# Patient Record
Sex: Female | Born: 1975 | Race: Black or African American | Hispanic: No | Marital: Married | Smoking: Never smoker
Health system: Southern US, Community
[De-identification: ages and names within clinical notes are randomized; demographics above are authoritative.]

## PROBLEM LIST (undated history)

## (undated) ENCOUNTER — Emergency Department (HOSPITAL_BASED_OUTPATIENT_CLINIC_OR_DEPARTMENT_OTHER): Admission: EM | Payer: Self-pay | Source: Home / Self Care

## (undated) DIAGNOSIS — F32A Depression, unspecified: Secondary | ICD-10-CM

## (undated) DIAGNOSIS — M5030 Other cervical disc degeneration, unspecified cervical region: Secondary | ICD-10-CM

## (undated) DIAGNOSIS — M51369 Other intervertebral disc degeneration, lumbar region without mention of lumbar back pain or lower extremity pain: Secondary | ICD-10-CM

## (undated) DIAGNOSIS — F419 Anxiety disorder, unspecified: Secondary | ICD-10-CM

## (undated) DIAGNOSIS — G35 Multiple sclerosis: Secondary | ICD-10-CM

## (undated) DIAGNOSIS — F329 Major depressive disorder, single episode, unspecified: Secondary | ICD-10-CM

## (undated) DIAGNOSIS — N189 Chronic kidney disease, unspecified: Secondary | ICD-10-CM

## (undated) DIAGNOSIS — M5136 Other intervertebral disc degeneration, lumbar region: Secondary | ICD-10-CM

## (undated) DIAGNOSIS — R42 Dizziness and giddiness: Secondary | ICD-10-CM

## (undated) DIAGNOSIS — G709 Myoneural disorder, unspecified: Secondary | ICD-10-CM

## (undated) DIAGNOSIS — D649 Anemia, unspecified: Secondary | ICD-10-CM

## (undated) DIAGNOSIS — G43909 Migraine, unspecified, not intractable, without status migrainosus: Secondary | ICD-10-CM

## (undated) DIAGNOSIS — G8929 Other chronic pain: Secondary | ICD-10-CM

## (undated) HISTORY — PX: HERNIA REPAIR: SHX51

## (undated) HISTORY — DX: Multiple sclerosis: G35

## (undated) HISTORY — PX: OTHER SURGICAL HISTORY: SHX169

## (undated) HISTORY — DX: Anemia, unspecified: D64.9

## (undated) HISTORY — DX: Major depressive disorder, single episode, unspecified: F32.9

## (undated) HISTORY — DX: Myoneural disorder, unspecified: G70.9

## (undated) HISTORY — DX: Depression, unspecified: F32.A

## (undated) HISTORY — DX: Other chronic pain: G89.29

## (undated) HISTORY — DX: Chronic kidney disease, unspecified: N18.9

---

## 2005-04-03 ENCOUNTER — Ambulatory Visit: Payer: Self-pay | Admitting: Family Medicine

## 2013-09-11 ENCOUNTER — Emergency Department (HOSPITAL_BASED_OUTPATIENT_CLINIC_OR_DEPARTMENT_OTHER)
Admission: EM | Admit: 2013-09-11 | Discharge: 2013-09-11 | Disposition: A | Discharge status: Home / Self Care | Payer: BC Managed Care – PPO | Attending: Emergency Medicine | Admitting: Emergency Medicine

## 2013-09-11 ENCOUNTER — Emergency Department (HOSPITAL_BASED_OUTPATIENT_CLINIC_OR_DEPARTMENT_OTHER): Payer: BC Managed Care – PPO

## 2013-09-11 ENCOUNTER — Encounter (HOSPITAL_BASED_OUTPATIENT_CLINIC_OR_DEPARTMENT_OTHER): Payer: Self-pay | Admitting: Emergency Medicine

## 2013-09-11 DIAGNOSIS — Z79899 Other long term (current) drug therapy: Secondary | ICD-10-CM | POA: Insufficient documentation

## 2013-09-11 DIAGNOSIS — G5601 Carpal tunnel syndrome, right upper limb: Secondary | ICD-10-CM

## 2013-09-11 DIAGNOSIS — G5600 Carpal tunnel syndrome, unspecified upper limb: Secondary | ICD-10-CM | POA: Insufficient documentation

## 2013-09-11 DIAGNOSIS — G43909 Migraine, unspecified, not intractable, without status migrainosus: Secondary | ICD-10-CM | POA: Insufficient documentation

## 2013-09-11 HISTORY — DX: Migraine, unspecified, not intractable, without status migrainosus: G43.909

## 2013-09-11 NOTE — ED Notes (Signed)
Patient states that last night her fingers felt numb and today her right hand feels numb and is swollen. Good color pulse, warm to touch.

## 2013-09-11 NOTE — Discharge Instructions (Signed)
Carpal Tunnel Syndrome The carpal tunnel is an area under the skin of the palm of your hand. Nerves, blood vessels, and strong tissues (tendons) pass through the tunnel. The tunnel can become puffy (swollen). If this happens, a nerve can be pinched in the wrist. This causes carpal tunnel syndrome.  HOME CARE  Take all medicine as told by your doctor.  If you were given a splint, wear it as told. Wear it at night or at times when your doctor told you to.  Rest your wrist from the activity that causes your pain.  Put ice on your wrist after long periods of wrist activity.  Put ice in a plastic bag.  Place a towel between your skin and the bag.  Leave the ice on for 15-20 minutes, 03-04 times a day.  Keep all doctor visits as told. GET HELP RIGHT AWAY IF:  You have new problems you cannot explain.  Your problems get worse and medicine does not help. MAKE SURE YOU:   Understand these instructions.  Will watch your condition.  Will get help right away if you are not doing well or get worse. Document Released: 06/26/2011 Document Revised: 09/29/2011 Document Reviewed: 06/26/2011 [employer] Patient Information 2014 [employer].  

## 2013-09-11 NOTE — ED Provider Notes (Signed)
CSN: [id number]     Arrival date & time 09/11/13  1714 History  This chart was scribed for Shaune Pollack, MD by Vernell Barrier, ED scribe. This patient was seen in room MH04/MH04 and the patient's care was started at 6:49 PM.   Chief Complaint  Patient presents with  . Hand Pain   Patient is a 38 y.o. female presenting with hand pain. The history is provided by the patient. No language interpreter was used.  Hand Pain This is a new problem. The current episode started yesterday. The problem occurs constantly. The problem has been gradually worsening. The symptoms are aggravated by twisting and bending. Nothing relieves the symptoms. She has tried nothing for the symptoms.   HPI Comments: Andrea Briggs is a 38 y.o. female who presents to the Emergency Department complaining of bilateral hand pain with associated finger tingling, onset 1 day ago. States she noticed her fingers began feeling numb yesterday in all but the  thumbs. Today she states the right hand feels numb and swollen. Pt wraps packages for a living. Pt is right handed.   Past Medical History  Diagnosis Date  . Migraine    Past Surgical History  Procedure Laterality Date  . Hernia repair     No family history on file. History  Substance Use Topics  . Smoking status: Never Smoker   . Smokeless tobacco: Not on file  . Alcohol Use: No   OB History   Grav Para Term Preterm Abortions TAB SAB Ect Mult Living                 Review of Systems  Neurological: Positive for numbness.   Allergies  Review of patient's allergies indicates no known allergies.  Home Medications   Current Outpatient Rx  Name  Route  Sig  Dispense  Refill  . ibuprofen (ADVIL,MOTRIN) 800 MG tablet   Oral   Take 800 mg by mouth every 8 (eight) hours as needed.         . nortriptyline (PAMELOR) 10 MG capsule   Oral   Take 10 mg by mouth at bedtime.         . topiramate (TOPAMAX) 15 MG capsule   Oral   Take 5 mg by mouth 2 (two)  times daily.          Triage Vitals: BP 113/79  Pulse 108  Temp(Src) 97.9 F (36.6 C) (Oral)  Resp 22  Ht 5\' 6"  (1.676 m)  Wt 200 lb (90.719 kg)  BMI 32.30 kg/m2  SpO2 98% Physical Exam  Nursing note and vitals reviewed. Constitutional: She is oriented to person, place, and time. She appears well-developed and well-nourished.  HENT:  Head: Normocephalic and atraumatic.  Right Ear: External ear normal.  Left Ear: External ear normal.  Nose: Nose normal.  Mouth/Throat: Oropharynx is clear and moist.  Eyes: Conjunctivae and EOM are normal. Pupils are equal, round, and reactive to light.  Neck: Normal range of motion. Neck supple. No JVD present. No tracheal deviation present. No thyromegaly present.  Cardiovascular: Normal rate, regular rhythm, normal heart sounds and intact distal pulses.   Pulmonary/Chest: Effort normal and breath sounds normal. No respiratory distress. She has no wheezes.  Abdominal: Soft. Bowel sounds are normal. She exhibits no mass. There is no tenderness. There is no guarding.  Musculoskeletal: Normal range of motion. She exhibits no tenderness.  +phalen's test right wrist +tinel's test right wrist Finger's pink, cap refill less than two seconds Full  arom fingers, wrist, elbow    Lymphadenopathy:    She has no cervical adenopathy.  Neurological: She is alert and oriented to person, place, and time. She has normal reflexes. No cranial nerve deficit or sensory deficit. Gait normal. GCS eye subscore is 4. GCS verbal subscore is 5. GCS motor subscore is 6.  Reflex Scores:      Bicep reflexes are 2+ on the right side and 2+ on the left side.      Patellar reflexes are 2+ on the right side and 2+ on the left side. HANDS: Sensation intact  Skin: Skin is warm and dry.  Psychiatric: She has a normal mood and affect. Her behavior is normal. Judgment and thought content normal.    ED Course  Procedures (including critical care time) DIAGNOSTIC  STUDIES: Oxygen Saturation is 98% on room air, normal by my interpretation.    COORDINATION OF CARE: At 6:52 PM: Discussed treatment plan with patient which includes putting the patient in a hand splint. Patient agrees.   Imaging Review Dg Hand Complete Right  09/11/2013   CLINICAL DATA:  Right hand swelling at base of ring finger and little finger, no known injury  EXAM: RIGHT HAND - COMPLETE 3+ VIEW  COMPARISON:  None  FINDINGS: Osseous mineralization normal.  Joint spaces preserved.  No fracture, dislocation, or bone destruction.  IMPRESSION: No acute osseous abnormalities.   Electronically Signed   By: Lavonia Dana M.D.   On: 09/11/2013 18:08   MDM   Final diagnoses:  Carpal tunnel syndrome, right    I personally performed the services described in this documentation, which was scribed in my presence. The recorded information has been reviewed and considered.     Shaune Pollack, MD 09/11/13 2021

## 2013-09-13 ENCOUNTER — Ambulatory Visit: Payer: BC Managed Care – PPO | Admitting: Family Medicine

## 2013-09-13 ENCOUNTER — Ambulatory Visit: Payer: Self-pay | Admitting: Family Medicine

## 2013-09-14 ENCOUNTER — Ambulatory Visit (INDEPENDENT_AMBULATORY_CARE_PROVIDER_SITE_OTHER): Payer: BC Managed Care – PPO | Admitting: Family Medicine

## 2013-09-14 ENCOUNTER — Encounter: Payer: Self-pay | Admitting: Family Medicine

## 2013-09-14 VITALS — BP 110/77 | HR 101 | Ht 66.0 in | Wt 200.0 lb

## 2013-09-14 DIAGNOSIS — M25539 Pain in unspecified wrist: Secondary | ICD-10-CM

## 2013-09-14 DIAGNOSIS — R202 Paresthesia of skin: Secondary | ICD-10-CM

## 2013-09-14 DIAGNOSIS — R209 Unspecified disturbances of skin sensation: Secondary | ICD-10-CM

## 2013-09-14 DIAGNOSIS — M25531 Pain in right wrist: Secondary | ICD-10-CM

## 2013-09-14 DIAGNOSIS — R200 Anesthesia of skin: Secondary | ICD-10-CM

## 2013-09-14 MED ORDER — PREDNISONE (PAK) 10 MG PO TABS: ORAL_TABLET | ORAL | Status: DC

## 2013-09-14 NOTE — Patient Instructions (Signed)
Use wrist brace as often as possible especially at nighttime when sleeping for pain, inflammation, swelling. Take prednisone dose pack as directed for 6 days. Out of work for 1 week - follow up at that time to reassess. If not improving can consider injection into carpal tunnel, nerve conduction studies.

## 2013-09-16 ENCOUNTER — Encounter: Payer: Self-pay | Admitting: Family Medicine

## 2013-09-16 DIAGNOSIS — G5602 Carpal tunnel syndrome, left upper limb: Secondary | ICD-10-CM | POA: Insufficient documentation

## 2013-09-16 NOTE — Assessment & Plan Note (Signed)
distribution not classic for carpal tunnel though history otherwise would be most consistent with this.  Will start with wrist braces.  Discussed injection - she would like to try oral prednisone instead.  Consider trial of injection, NCVs if not improving.  F/u in 1 week - unable to wear brace at work so out of work until f/u.

## 2013-09-16 NOTE — Progress Notes (Signed)
Patient ID: Andrea Briggs, female   DOB: 06-Sep-1975, 38 y.o.   MRN: [id number]  PCP: No primary provider on file.  Subjective:   HPI: Patient is a 38 y.o. female here for right hand numbness.  Patient reports she's had issues with carpal tunnel in the past. Reports on Sunday her fingers (all but the thumb) started going numb. Had some of this on left side but it went away. Hand felt swollen also. 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 Tried ibuprofen. Has a wrist splint but not allowed to wear at work. Not tried any other medicines for this. Previously did prednisone dose packs for carpal tunnel.  Past Medical History  Diagnosis Date  . Migraine     Current Outpatient Prescriptions on File Prior to Visit  Medication Sig Dispense Refill  . ibuprofen (ADVIL,MOTRIN) 800 MG tablet Take 800 mg by mouth every 8 (eight) hours as needed.      . nortriptyline (PAMELOR) 10 MG capsule Take 10 mg by mouth at bedtime.      . topiramate (TOPAMAX) 15 MG capsule Take 5 mg by mouth 2 (two) times daily.       No current facility-administered medications on file prior to visit.    Past Surgical History  Procedure Laterality Date  . Hernia repair      No Known Allergies  History   Social History  . Marital Status: Single    Spouse Name: N/A    Number of Children: N/A  . Years of Education: N/A   Occupational History  . Not on file.   Social History Main Topics  . Smoking status: Never Smoker   . Smokeless tobacco: Not on file  . Alcohol Use: No  . Drug Use: Not on file  . Sexual Activity: Not on file   Other Topics Concern  . Not on file   Social History Narrative  . No narrative on file    History reviewed. No pertinent family history.  BP 110/77  Pulse 101  Ht 5\' 6"  (1.676 m)  Wt 200 lb (90.719 kg)  BMI 32.30 kg/m2  Review of Systems: See HPI above.    Objective:  Physical Exam:  Gen: NAD  Right hand/wrist: No gross deformity, swelling,  bruising. TTP volar wrist.  No other tenderness. FROM wrist and digits with 5/5 strength. + tinels and phalens. Sensation diminished 2nd-5th digits.  Assessment & Plan:  1. Right hand numbness - distribution not classic for carpal tunnel though history otherwise would be most consistent with this.  Will start with wrist braces.  Discussed injection - she would like to try oral prednisone instead.  Consider trial of injection, NCVs if not improving.  F/u in 1 week - unable to wear brace at work so out of work until f/u.

## 2013-09-21 ENCOUNTER — Encounter: Payer: Self-pay | Admitting: Family Medicine

## 2013-09-21 ENCOUNTER — Ambulatory Visit (INDEPENDENT_AMBULATORY_CARE_PROVIDER_SITE_OTHER): Payer: BC Managed Care – PPO | Admitting: Family Medicine

## 2013-09-21 VITALS — BP 107/74 | HR 72 | Ht 66.0 in | Wt 200.0 lb

## 2013-09-21 DIAGNOSIS — R209 Unspecified disturbances of skin sensation: Secondary | ICD-10-CM

## 2013-09-21 DIAGNOSIS — R202 Paresthesia of skin: Principal | ICD-10-CM

## 2013-09-21 DIAGNOSIS — R200 Anesthesia of skin: Secondary | ICD-10-CM

## 2013-09-21 NOTE — Patient Instructions (Signed)
Use wrist brace as often as possible especially at nighttime when sleeping for pain, inflammation, swelling. Cortisone injection given today. Out of work for 2 more weeks then follow up at that time to reassess. If still not improving next step would be nerve conduction studies.

## 2013-09-23 ENCOUNTER — Encounter: Payer: Self-pay | Admitting: Family Medicine

## 2013-09-23 NOTE — Progress Notes (Signed)
Patient ID: Buckner Malta, female   DOB: 1976-04-01, 38 y.o.   MRN: [id number]  PCP: No primary provider on file.  Subjective:   HPI: Patient is a 38 y.o. female here for right hand numbness.  2/25: Patient reports she's had issues with carpal tunnel in the past. Reports on Sunday her fingers (all but the thumb) started going numb. Had some of this on left side but it went away. Hand felt swollen also. 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 Tried ibuprofen. Has a wrist splint but not allowed to wear at work. Not tried any other medicines for this. Previously did prednisone dose packs for carpal tunnel.  3/4: Patient reports she is no better following prednisone and using wrist braces. Slight swelling. Icing and taking ibuprofen now. Keeping her up at night.  Past Medical History  Diagnosis Date  . Migraine     Current Outpatient Prescriptions on File Prior to Visit  Medication Sig Dispense Refill  . hydrOXYzine (VISTARIL) 25 MG capsule       . ibuprofen (ADVIL,MOTRIN) 800 MG tablet Take 800 mg by mouth every 8 (eight) hours as needed.      . nortriptyline (PAMELOR) 10 MG capsule Take 10 mg by mouth at bedtime.      . predniSONE (STERAPRED UNI-PAK) 10 MG tablet 6 tabs po day 1, 5 tabs po day 2, 4 tabs po day 3, 3 tabs po day 4, 2 tabs po day 5, 1 tab po day 6  21 tablet  0  . topiramate (TOPAMAX) 15 MG capsule Take 5 mg by mouth 2 (two) times daily.       No current facility-administered medications on file prior to visit.    Past Surgical History  Procedure Laterality Date  . Hernia repair      No Known Allergies  History   Social History  . Marital Status: Single    Spouse Name: N/A    Number of Children: N/A  . Years of Education: N/A   Occupational History  . Not on file.   Social History Main Topics  . Smoking status: Never Smoker   . Smokeless tobacco: Not on file  . Alcohol Use: No  . Drug Use: Not on file  . Sexual Activity: Not on file    Other Topics Concern  . Not on file   Social History Narrative  . No narrative on file    History reviewed. No pertinent family history.  BP 107/74  Pulse 72  Ht 5\' 6"  (1.676 m)  Wt 200 lb (90.719 kg)  BMI 32.30 kg/m2  Review of Systems: See HPI above.    Objective:  Physical Exam:  Gen: NAD  Right hand/wrist: No gross deformity, swelling, bruising. TTP volar wrist.  No other tenderness. FROM wrist and digits with 5/5 strength. + tinels and phalens. Sensation diminished all digits, mainly first 4 now.  Assessment & Plan:  1. Right hand numbness - likely due to carpal tunnel syndrome.  Continue wrist braces.  Injection given today.  Out of work 2 more weeks then reassess.  NCVs/EMGs if not improving.  After informed written consent patient was seated on exam table.  Ultrasound used to identify between right median nerve and ulnar artery.  Alcohol swab for prep then injected in this location of right wrist with 2:1 marcaine: depomedrol.  Patient tolerated procedure well without immediate complications.

## 2013-09-23 NOTE — Assessment & Plan Note (Signed)
likely due to carpal tunnel syndrome.  Continue wrist braces.  Injection given today.  Out of work 2 more weeks then reassess.  NCVs/EMGs if not improving.  After informed written consent patient was seated on exam table.  Ultrasound used to identify between right median nerve and ulnar artery.  Alcohol swab for prep then injected in this location of right wrist with 2:1 marcaine: depomedrol.  Patient tolerated procedure well without immediate complications.

## 2013-10-06 ENCOUNTER — Ambulatory Visit (INDEPENDENT_AMBULATORY_CARE_PROVIDER_SITE_OTHER): Payer: BC Managed Care – PPO | Admitting: Family Medicine

## 2013-10-06 ENCOUNTER — Encounter: Payer: Self-pay | Admitting: Family Medicine

## 2013-10-06 VITALS — BP 112/80 | HR 89 | Ht 66.0 in | Wt 200.0 lb

## 2013-10-06 DIAGNOSIS — R200 Anesthesia of skin: Secondary | ICD-10-CM

## 2013-10-06 DIAGNOSIS — R202 Paresthesia of skin: Principal | ICD-10-CM

## 2013-10-06 DIAGNOSIS — R209 Unspecified disturbances of skin sensation: Secondary | ICD-10-CM

## 2013-10-06 NOTE — Patient Instructions (Signed)
Follow up with me as needed - call with any problems. See work note for details.

## 2013-10-10 ENCOUNTER — Encounter: Payer: Self-pay | Admitting: Family Medicine

## 2013-10-10 NOTE — Progress Notes (Signed)
Patient ID: Andrea Briggs, female   DOB: 16-Sep-1975, 38 y.o.   MRN: [id number]  PCP: No primary provider on file.  Subjective:   HPI: Patient is a 38 y.o. female here for right hand numbness.  2/25: Patient reports she's had issues with carpal tunnel in the past. Reports on Sunday her fingers (all but the thumb) started going numb. Had some of this on left side but it went away. Hand felt swollen also. 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 Tried ibuprofen. Has a wrist splint but not allowed to wear at work. Not tried any other medicines for this. Previously did prednisone dose packs for carpal tunnel.  3/4: Patient reports she is no better following prednisone and using wrist braces. Slight swelling. Icing and taking ibuprofen now. Keeping her up at night.  3/19: Patient doing much better following injection. A little pain Monday and Tuesday but none now. No swelling. Taking ibuprofen and pain medicine occasionally.  Past Medical History  Diagnosis Date  . Migraine     Current Outpatient Prescriptions on File Prior to Visit  Medication Sig Dispense Refill  . hydrOXYzine (VISTARIL) 25 MG capsule       . ibuprofen (ADVIL,MOTRIN) 800 MG tablet Take 800 mg by mouth every 8 (eight) hours as needed.      . nortriptyline (PAMELOR) 10 MG capsule Take 10 mg by mouth at bedtime.      . topiramate (TOPAMAX) 15 MG capsule Take 5 mg by mouth 2 (two) times daily.       No current facility-administered medications on file prior to visit.    Past Surgical History  Procedure Laterality Date  . Hernia repair      No Known Allergies  History   Social History  . Marital Status: Single    Spouse Name: N/A    Number of Children: N/A  . Years of Education: N/A   Occupational History  . Not on file.   Social History Main Topics  . Smoking status: Never Smoker   . Smokeless tobacco: Not on file  . Alcohol Use: No  . Drug Use: Not on file  . Sexual Activity: Not  on file   Other Topics Concern  . Not on file   Social History Narrative  . No narrative on file    History reviewed. No pertinent family history.  BP 112/80  Pulse 89  Ht 5\' 6"  (1.676 m)  Wt 200 lb (90.719 kg)  BMI 32.30 kg/m2  Review of Systems: See HPI above.    Objective:  Physical Exam:  Gen: NAD  Right hand/wrist: No gross deformity, swelling, bruising. No TTP volar wrist.  No other tenderness. FROM wrist and digits with 5/5 strength. Negative tinels and phalens.  Assessment & Plan:  1. Right hand numbness - 2/2 carpal tunnel syndrome.  Continue wrist braces.  Much better following injection.  Will return to work (see note for date).  F/u prn.  Consider NCVs/EMGs if worsens.

## 2013-10-10 NOTE — Assessment & Plan Note (Signed)
2/2 carpal tunnel syndrome.  Continue wrist braces.  Much better following injection.  Will return to work (see note for date).  F/u prn.  Consider NCVs/EMGs if worsens.

## 2013-11-24 ENCOUNTER — Ambulatory Visit: Payer: BC Managed Care – PPO | Admitting: Family Medicine

## 2013-11-28 ENCOUNTER — Encounter: Payer: Self-pay | Admitting: Family Medicine

## 2013-11-28 ENCOUNTER — Ambulatory Visit (INDEPENDENT_AMBULATORY_CARE_PROVIDER_SITE_OTHER): Payer: BC Managed Care – PPO | Admitting: Family Medicine

## 2013-11-28 VITALS — BP 104/72 | HR 80 | Ht 66.0 in | Wt 200.0 lb

## 2013-11-28 DIAGNOSIS — G5600 Carpal tunnel syndrome, unspecified upper limb: Secondary | ICD-10-CM

## 2013-11-28 DIAGNOSIS — G5603 Carpal tunnel syndrome, bilateral upper limbs: Secondary | ICD-10-CM

## 2013-11-28 NOTE — Patient Instructions (Signed)
Use wrist brace as often as possible especially at nighttime when sleeping for pain, inflammation, swelling. Consider repeating cortisone injection or nerve conduction studies as next steps. Follow up with me as needed.

## 2013-11-30 ENCOUNTER — Encounter: Payer: Self-pay | Admitting: Family Medicine

## 2013-11-30 NOTE — Assessment & Plan Note (Addendum)
Symptoms have recurred right side, some intermittent issues with left side.  Continue wrist braces.  Discussed considering repeating injection, NCVs/EMGs.  She wants to wait on either of these for now.  F/u prn.

## 2013-11-30 NOTE — Progress Notes (Signed)
Patient ID: Andrea Briggs, female   DOB: 1976/06/17, 38 y.o.   MRN: [id number]  PCP: No primary provider on file.  Subjective:   HPI: Patient is a 38 y.o. female here for right hand numbness.  2/25: Patient reports she's had issues with carpal tunnel in the past. Reports on Sunday her fingers (all but the thumb) started going numb. Had some of this on left side but it went away. Hand felt swollen also. 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 Tried ibuprofen. Has a wrist splint but not allowed to wear at work. Not tried any other medicines for this. Previously did prednisone dose packs for carpal tunnel.  3/4: Patient reports she is no better following prednisone and using wrist braces. Slight swelling. Icing and taking ibuprofen now. Keeping her up at night.  3/19: Patient doing much better following injection. A little pain Monday and Tuesday but none now. No swelling. Taking ibuprofen and pain medicine occasionally.  5/11: Patient reports over past 2 weeks she's had symptoms in right hand return. Started intermittently and becoming more constant. Numbness, tightness into fingers and pain as well especially into right middle and ring fingers. Difficulty grasping items. No swelling. Getting some left hand numbness intermittently. Able to wear brace on right wrist at work now.  Past Medical History  Diagnosis Date  . Migraine     Current Outpatient Prescriptions on File Prior to Visit  Medication Sig Dispense Refill  . hydrOXYzine (VISTARIL) 25 MG capsule       . ibuprofen (ADVIL,MOTRIN) 800 MG tablet Take 800 mg by mouth every 8 (eight) hours as needed.      . nortriptyline (PAMELOR) 10 MG capsule Take 10 mg by mouth at bedtime.      . topiramate (TOPAMAX) 15 MG capsule Take 5 mg by mouth 2 (two) times daily.       No current facility-administered medications on file prior to visit.    Past Surgical History  Procedure Laterality Date  . Hernia repair       No Known Allergies  History   Social History  . Marital Status: Single    Spouse Name: N/A    Number of Children: N/A  . Years of Education: N/A   Occupational History  . Not on file.   Social History Main Topics  . Smoking status: Never Smoker   . Smokeless tobacco: Not on file  . Alcohol Use: No  . Drug Use: Not on file  . Sexual Activity: Not on file   Other Topics Concern  . Not on file   Social History Narrative  . No narrative on file    History reviewed. No pertinent family history.  BP 104/72  Pulse 80  Ht 5\' 6"  (1.676 m)  Wt 200 lb (90.719 kg)  BMI 32.30 kg/m2  Review of Systems: See HPI above.    Objective:  Physical Exam:  Gen: NAD  Right hand/wrist: No gross deformity, swelling, bruising. TTP volar wrist over carpal tunnel.  No other tenderness. FROM wrist and digits with 5/5 strength. Positive tinels, negative phalens. Sensation diminished in digits especially 3rd, 4th digits.  Left hand/wrist: No gross deformity, swelling, bruising. Minimal TTP volar wrist over carpal tunnel.  No other tenderness. FROM wrist and digits with 5/5 strength. Negative tinels, negative phalens. Sensation intact currently.  Assessment & Plan:  1. Bilateral hand numbness - 2/2 carpal tunnel syndrome.  Symptoms have recurred right side, some intermittent issues with left  side.  Continue wrist braces.  Discussed considering repeating injection, NCVs/EMGs.  She wants to wait on either of these for now.  F/u prn.

## 2013-12-21 ENCOUNTER — Emergency Department (HOSPITAL_BASED_OUTPATIENT_CLINIC_OR_DEPARTMENT_OTHER)
Admission: EM | Admit: 2013-12-21 | Discharge: 2013-12-22 | Disposition: A | Discharge status: Home / Self Care | Payer: BC Managed Care – PPO | Attending: Emergency Medicine | Admitting: Emergency Medicine

## 2013-12-21 ENCOUNTER — Emergency Department (HOSPITAL_BASED_OUTPATIENT_CLINIC_OR_DEPARTMENT_OTHER): Payer: BC Managed Care – PPO

## 2013-12-21 ENCOUNTER — Encounter (HOSPITAL_BASED_OUTPATIENT_CLINIC_OR_DEPARTMENT_OTHER): Payer: Self-pay | Admitting: Emergency Medicine

## 2013-12-21 DIAGNOSIS — R1031 Right lower quadrant pain: Secondary | ICD-10-CM | POA: Insufficient documentation

## 2013-12-21 DIAGNOSIS — R110 Nausea: Secondary | ICD-10-CM | POA: Insufficient documentation

## 2013-12-21 DIAGNOSIS — R109 Unspecified abdominal pain: Secondary | ICD-10-CM

## 2013-12-21 DIAGNOSIS — R319 Hematuria, unspecified: Secondary | ICD-10-CM

## 2013-12-21 DIAGNOSIS — M549 Dorsalgia, unspecified: Secondary | ICD-10-CM | POA: Insufficient documentation

## 2013-12-21 DIAGNOSIS — Z3202 Encounter for pregnancy test, result negative: Secondary | ICD-10-CM | POA: Insufficient documentation

## 2013-12-21 DIAGNOSIS — G43909 Migraine, unspecified, not intractable, without status migrainosus: Secondary | ICD-10-CM | POA: Insufficient documentation

## 2013-12-21 DIAGNOSIS — Z79899 Other long term (current) drug therapy: Secondary | ICD-10-CM | POA: Insufficient documentation

## 2013-12-21 LAB — BASIC METABOLIC PANEL
BUN: 13 mg/dL (ref 6–23)
CHLORIDE: 107 meq/L (ref 96–112)
CO2: 23 mEq/L (ref 19–32)
Calcium: 9 mg/dL (ref 8.4–10.5)
Creatinine, Ser: 1.1 mg/dL (ref 0.50–1.10)
GFR, EST AFRICAN AMERICAN: 73 mL/min — AB (ref >90)
GFR, EST NON AFRICAN AMERICAN: 63 mL/min — AB (ref >90)
Glucose, Bld: 97 mg/dL (ref 70–99)
POTASSIUM: 3.7 meq/L (ref 3.7–5.3)
Sodium: 141 mEq/L (ref 137–147)

## 2013-12-21 LAB — URINE MICROSCOPIC-ADD ON

## 2013-12-21 LAB — URINALYSIS, ROUTINE W REFLEX MICROSCOPIC
Bilirubin Urine: NEGATIVE
GLUCOSE, UA: NEGATIVE mg/dL
Ketones, ur: NEGATIVE mg/dL
LEUKOCYTES UA: NEGATIVE
Nitrite: NEGATIVE
PH: 5.5 (ref 5.0–8.0)
PROTEIN: NEGATIVE mg/dL
Specific Gravity, Urine: 1.034 — ABNORMAL HIGH (ref 1.005–1.030)
Urobilinogen, UA: 0.2 mg/dL (ref 0.0–1.0)

## 2013-12-21 LAB — PREGNANCY, URINE: Preg Test, Ur: NEGATIVE

## 2013-12-21 MED ORDER — OXYCODONE-ACETAMINOPHEN 5-325 MG PO TABS: 2.0000 | ORAL_TABLET | ORAL | Status: DC | PRN

## 2013-12-21 MED ORDER — KETOROLAC TROMETHAMINE 60 MG/2ML IM SOLN
60.0000 mg | Freq: Once | INTRAMUSCULAR | Status: AC
  Administered 2013-12-21: 60 mg via INTRAMUSCULAR
  Filled 2013-12-21: qty 2

## 2013-12-21 NOTE — ED Notes (Signed)
MD at bedside. 

## 2013-12-21 NOTE — Discharge Instructions (Signed)
Abdominal Pain, Adult Many things can cause abdominal pain. Usually, abdominal pain is not caused by a disease and will improve without treatment. It can often be observed and treated at home. Your health care provider will do a physical exam and possibly order blood tests and X-rays to help determine the seriousness of your pain. However, in many cases, more time must pass before a clear cause of the pain can be found. Before that point, your health care provider may not know if you need more testing or further treatment. HOME CARE INSTRUCTIONS  Monitor your abdominal pain for any changes. The following actions may help to alleviate any discomfort you are experiencing:  Only take over-the-counter or prescription medicines as directed by your health care provider.  Do not take laxatives unless directed to do so by your health care provider.  Try a clear liquid diet (broth, tea, or water) as directed by your health care provider. Slowly move to a bland diet as tolerated. SEEK MEDICAL CARE IF:  You have unexplained abdominal pain.  You have abdominal pain associated with nausea or diarrhea.  You have pain when you urinate or have a bowel movement.  You experience abdominal pain that wakes you in the night.  You have abdominal pain that is worsened or improved by eating food.  You have abdominal pain that is worsened with eating fatty foods. SEEK IMMEDIATE MEDICAL CARE IF:   Your pain does not go away within 2 hours.  You have a fever.  You keep throwing up (vomiting).  Your pain is felt only in portions of the abdomen, such as the right side or the left lower portion of the abdomen.  You pass bloody or black tarry stools. MAKE SURE YOU:  Understand these instructions.   Will watch your condition.   Will get help right away if you are not doing well or get worse.  Document Released: 04/16/2005 Document Revised: 04/27/2013 Document Reviewed: 03/16/2013 [facility] Patient  Information 2014 [place].  Hematuria, Adult Hematuria is blood in your urine. It can be caused by a bladder infection, kidney infection, prostate infection, kidney stone, or cancer of your urinary tract. Infections can usually be treated with medicine, and a kidney stone usually will pass through your urine. If neither of these is the cause of your hematuria, further workup to find out the reason may be needed. It is very important that you tell your health care provider about any blood you see in your urine, even if the blood stops without treatment or happens without causing pain. Blood in your urine that happens and then stops and then happens again can be a symptom of a very serious condition. Also, pain is not a symptom in the initial stages of many urinary cancers. HOME CARE INSTRUCTIONS   Drink lots of fluid, 3 4 quarts a day. If you have been diagnosed with an infection, cranberry juice is especially recommended, in addition to large amounts of water.  Avoid caffeine, tea, and carbonated beverages, because they tend to irritate the bladder.  Avoid alcohol because it may irritate the prostate.  Only take over-the-counter or prescription medicines for pain, discomfort, or fever as directed by your health care provider.  If you have been diagnosed with a kidney stone, follow your health care provider's instructions regarding straining your urine to catch the stone.  Empty your bladder often. Avoid holding urine for long periods of time.  After a bowel movement, women should cleanse front to back. Use  each tissue only once.  Empty your bladder before and after sexual intercourse if you are a female. SEEK MEDICAL CARE IF: You develop back pain, fever, a feeling of sickness in your stomach (nausea), or vomiting or if your symptoms are not better in 3 days. Return sooner if you are getting worse. SEEK IMMEDIATE MEDICAL CARE IF:   You have a persistent fever, with a temperature of  101.72F (38.8C) or greater.  You develop severe vomiting and are unable to keep the medicine down.  You develop severe back or abdominal pain despite taking your medicines.  You begin passing a large amount of blood or clots in your urine.  You feel extremely weak or faint, or you pass out. MAKE SURE YOU:   Understand these instructions.  Will watch your condition.  Will get help right away if you are not doing well or get worse. Document Released: 07/07/2005 Document Revised: 04/27/2013 Document Reviewed: 03/07/2013 [facility] Patient Information 2014 [place].

## 2013-12-21 NOTE — ED Provider Notes (Signed)
CSN: [id number]     Arrival date & time 12/21/13  2049 History  This chart was scribed for Malvin Johns, MD, by Neta Ehlers, ED Scribe. This patient was seen in room MH08/MH08 and the patient's care was started at 9:34 PM.   First MD Initiated Contact with Patient 12/21/13 2109     Chief Complaint  Patient presents with  . Flank Pain    The history is provided by the patient. No language interpreter was used.   HPI Comments:  Andrea Briggs is a 38 y.o. female who presents to the Emergency Department complaining of five days of waxing and waning bilateral flank pain which radiates through her back.  She has used heat and pain medication without resolution. She denies nausea, emesis, urinary symptoms, chest pain, SOB, diarrhea, or constipation. Andrea Briggs endorses a h/o renal calculi six years ago; the calculi resolved without further intervention. The pt states the current pain is different from the pain she experienced with the renal calculi because the current pain radiates unlike the past renal calculi. She reports she spent two nights sleeping in a hospital chair last week, and she questions if she could have strained her back. She denies a h/o surgeries. She denies the possibility of pregnancy; her menses ended five days ago.   Past Medical History  Diagnosis Date  . Migraine    Past Surgical History  Procedure Laterality Date  . Hernia repair     History reviewed. No pertinent family history. History  Substance Use Topics  . Smoking status: Never Smoker   . Smokeless tobacco: Not on file  . Alcohol Use: No   No OB history provided.  Review of Systems  Constitutional: Negative for fever, chills, diaphoresis and fatigue.  HENT: Negative for congestion, rhinorrhea and sneezing.   Eyes: Negative.   Respiratory: Negative for cough, chest tightness and shortness of breath.   Cardiovascular: Negative for chest pain and leg swelling.  Gastrointestinal: Positive for nausea and  abdominal pain. Negative for vomiting, diarrhea and blood in stool.  Genitourinary: Negative for frequency, hematuria, flank pain and difficulty urinating.  Musculoskeletal: Positive for back pain. Negative for arthralgias.  Skin: Negative for rash.  Neurological: Negative for dizziness, speech difficulty, weakness, numbness and headaches.      Allergies  Review of patient's allergies indicates no known allergies.  Home Medications   Prior to Admission medications   Medication Sig Start Date End Date Taking? Authorizing Provider  hydrOXYzine (VISTARIL) 25 MG capsule  06/23/13   Historical Provider, MD  ibuprofen (ADVIL,MOTRIN) 800 MG tablet Take 800 mg by mouth every 8 (eight) hours as needed.    Historical Provider, MD  nortriptyline (PAMELOR) 10 MG capsule Take 10 mg by mouth at bedtime.    Historical Provider, MD  oxyCODONE-acetaminophen (PERCOCET) 5-325 MG per tablet Take 2 tablets by mouth every 4 (four) hours as needed. 12/21/13   Malvin Johns, MD  topiramate (TOPAMAX) 15 MG capsule Take 5 mg by mouth 2 (two) times daily.    Historical Provider, MD   Triage Vitals: BP 119/78  Pulse 75  Temp(Src) 98.4 F (36.9 C) (Oral)  Resp 16  Ht 5\' 6"  (1.676 m)  Wt 200 lb (90.719 kg)  BMI 32.30 kg/m2  SpO2 100%  LMP 12/14/2013  Physical Exam  Constitutional: She is oriented to person, place, and time. She appears well-developed and well-nourished.  HENT:  Head: Normocephalic and atraumatic.  Eyes: Pupils are equal, round, and reactive to light.  Neck:  Normal range of motion. Neck supple.  Cardiovascular: Normal rate, regular rhythm and normal heart sounds.   Pulmonary/Chest: Effort normal and breath sounds normal. No respiratory distress. She has no wheezes. She has no rales. She exhibits no tenderness.  Abdominal: Soft. Bowel sounds are normal. There is tenderness (Moderate tenderness across the lower abdomen bilaterally however more on the right mid and lower abdomen. Positive CVA  tenderness on the right). There is no rebound and no guarding.  Musculoskeletal: Normal range of motion. She exhibits no edema.  Lymphadenopathy:    She has no cervical adenopathy.  Neurological: She is alert and oriented to person, place, and time.  Skin: Skin is warm and dry. No rash noted.  Psychiatric: She has a normal mood and affect.    ED Course  Procedures (including critical care time)  DIAGNOSTIC STUDIES: Oxygen Saturation is 100% on room air, normal by my interpretation.    COORDINATION OF CARE:  9:39 PM- Discussed treatment plan with patient, and the patient agreed to the plan. The plan includes labs, imaging, and pain medication.   Labs Review Results for orders placed during the hospital encounter of 12/21/13  URINALYSIS, ROUTINE W REFLEX MICROSCOPIC      Result Value Ref Range   Color, Urine YELLOW  YELLOW   APPearance CLEAR  CLEAR   Specific Gravity, Urine 1.034 (*) 1.005 - 1.030   pH 5.5  5.0 - 8.0   Glucose, UA NEGATIVE  NEGATIVE mg/dL   Hgb urine dipstick MODERATE (*) NEGATIVE   Bilirubin Urine NEGATIVE  NEGATIVE   Ketones, ur NEGATIVE  NEGATIVE mg/dL   Protein, ur NEGATIVE  NEGATIVE mg/dL   Urobilinogen, UA 0.2  0.0 - 1.0 mg/dL   Nitrite NEGATIVE  NEGATIVE   Leukocytes, UA NEGATIVE  NEGATIVE  PREGNANCY, URINE      Result Value Ref Range   Preg Test, Ur NEGATIVE  NEGATIVE  URINE MICROSCOPIC-ADD ON      Result Value Ref Range   Squamous Epithelial / LPF RARE  RARE   WBC, UA 0-2  <3 WBC/hpf   RBC / HPF 7-10  <3 RBC/hpf   Bacteria, UA FEW (*) RARE  BASIC METABOLIC PANEL      Result Value Ref Range   Sodium 141  137 - 147 mEq/L   Potassium 3.7  3.7 - 5.3 mEq/L   Chloride 107  96 - 112 mEq/L   CO2 23  19 - 32 mEq/L   Glucose, Bld 97  70 - 99 mg/dL   BUN 13  6 - 23 mg/dL   Creatinine, Ser 1.10  0.50 - 1.10 mg/dL   Calcium 9.0  8.4 - 10.5 mg/dL   GFR calc non Af Amer 63 (*) >90 mL/min   GFR calc Af Amer 73 (*) >90 mL/min   Ct Abdomen Pelvis Wo  Contrast  12/21/2013   CLINICAL DATA:  Left flank pain and hematuria.  EXAM: CT ABDOMEN AND PELVIS WITHOUT CONTRAST  TECHNIQUE: Multidetector CT imaging of the abdomen and pelvis was performed following the standard protocol without IV contrast.  COMPARISON:  None.  FINDINGS: The kidneys exhibit normal contour. A subcentimeter hypodensity in the lateral aspect of the lower pole of the right kidney has Hounsfield measurement of -30 consistent with a fat containing lesion such is an angiomyolipoma. There is no hydronephrosis. No calcified stones are demonstrated. The perinephric fat is normal in density. Along the course of the ureters no calcified stones are demonstrated. The partially distended urinary  bladder is normal in appearance. The uterus and left adnexal structures are normal for age. There is a 2.3 x 3.9 cm right adnexal structure with Hounsfield measurement of +34. There is no free pelvic fluid.  The liver, gallbladder, pancreas, spleen, and adrenal glands are normal in appearance. The stomach contains food particles. The small and large bowel exhibit no evidence of ileus nor obstruction nor acute inflammation. A normal appearing appendix is demonstrated. There is no evidence of ascites. There is no inguinal nor significant umbilical hernia.  The lumbar spine and bony pelvis are normal. The lung bases are clear.  IMPRESSION: 1. There is no evidence of urinary tract obstruction,calcified stones, or perinephric inflammatory change. 2. There is soft tissue fullness in the right adnexal region which may reflect a hemorrhagic right ovarian cyst. Pelvic ultrasound would be useful. 3. There is no acute hepatobiliary abnormality nor acute bowel abnormality.   Electronically Signed   By: David  Martinique   On: 12/21/2013 22:06      Imaging Review Ct Abdomen Pelvis Wo Contrast  12/21/2013   CLINICAL DATA:  Left flank pain and hematuria.  EXAM: CT ABDOMEN AND PELVIS WITHOUT CONTRAST  TECHNIQUE: Multidetector CT  imaging of the abdomen and pelvis was performed following the standard protocol without IV contrast.  COMPARISON:  None.  FINDINGS: The kidneys exhibit normal contour. A subcentimeter hypodensity in the lateral aspect of the lower pole of the right kidney has Hounsfield measurement of -30 consistent with a fat containing lesion such is an angiomyolipoma. There is no hydronephrosis. No calcified stones are demonstrated. The perinephric fat is normal in density. Along the course of the ureters no calcified stones are demonstrated. The partially distended urinary bladder is normal in appearance. The uterus and left adnexal structures are normal for age. There is a 2.3 x 3.9 cm right adnexal structure with Hounsfield measurement of +34. There is no free pelvic fluid.  The liver, gallbladder, pancreas, spleen, and adrenal glands are normal in appearance. The stomach contains food particles. The small and large bowel exhibit no evidence of ileus nor obstruction nor acute inflammation. A normal appearing appendix is demonstrated. There is no evidence of ascites. There is no inguinal nor significant umbilical hernia.  The lumbar spine and bony pelvis are normal. The lung bases are clear.  IMPRESSION: 1. There is no evidence of urinary tract obstruction,calcified stones, or perinephric inflammatory change. 2. There is soft tissue fullness in the right adnexal region which may reflect a hemorrhagic right ovarian cyst. Pelvic ultrasound would be useful. 3. There is no acute hepatobiliary abnormality nor acute bowel abnormality.   Electronically Signed   By: David  Martinique   On: 12/21/2013 22:06   US Transvaginal Non-ob  12/21/2013   CLINICAL DATA:  Flank pain with adnexal fullness on CT.  EXAM: TRANSABDOMINAL AND TRANSVAGINAL ULTRASOUND OF PELVIS  TECHNIQUE: Both transabdominal and transvaginal ultrasound examinations of the pelvis were performed. Transabdominal technique was performed for global imaging of the pelvis  including uterus, ovaries, adnexal regions, and pelvic cul-de-sac. It was necessary to proceed with endovaginal exam following the transabdominal exam to visualize the ovaries.  COMPARISON:  None  FINDINGS: Uterus  Measurements: 6 x 3 x 4 cm. No fibroids or other mass visualized.  Endometrium  Thickness: 5 mm.  No focal abnormality visualized.  Right ovary  Measurements: 3.7 x 2.4 x 2.6 cm. Normal appearance/no adnexal mass.  Left ovary  Measurements: 3.5 x 2.1 x 2.5 cm. Normal appearance/no adnexal mass.  Other findings  No free fluid.  IMPRESSION: Normal pelvic ultrasound.   Electronically Signed   By: Jorje Guild M.D.   On: 12/21/2013 23:24   US Pelvis Complete  12/21/2013   CLINICAL DATA:  Flank pain with adnexal fullness on CT.  EXAM: TRANSABDOMINAL AND TRANSVAGINAL ULTRASOUND OF PELVIS  TECHNIQUE: Both transabdominal and transvaginal ultrasound examinations of the pelvis were performed. Transabdominal technique was performed for global imaging of the pelvis including uterus, ovaries, adnexal regions, and pelvic cul-de-sac. It was necessary to proceed with endovaginal exam following the transabdominal exam to visualize the ovaries.  COMPARISON:  None  FINDINGS: Uterus  Measurements: 6 x 3 x 4 cm. No fibroids or other mass visualized.  Endometrium  Thickness: 5 mm.  No focal abnormality visualized.  Right ovary  Measurements: 3.7 x 2.4 x 2.6 cm. Normal appearance/no adnexal mass.  Left ovary  Measurements: 3.5 x 2.1 x 2.5 cm. Normal appearance/no adnexal mass.  Other findings  No free fluid.  IMPRESSION: Normal pelvic ultrasound.   Electronically Signed   By: Jorje Guild M.D.   On: 12/21/2013 23:24     EKG Interpretation None      MDM   Final diagnoses:  Abdominal pain  Hematuria    Patient with back and right flank pain. She has no kidney stones or hydroureter on CT. There is a question of any ovarian cyst and a pelvic ultrasound was done however there is no ovarian abnormalities on  ultrasound. Patient denies any vaginal bleeding or discharge. She does have hematuria with the associated flank pain. This could represent a recently passed stone. She was given Toradol in the ED. She is still a little bit better but says she still hurting although she appears to be very comfortable in the room. She has no vomiting or fevers. There is no suggestions of pyelonephritis or UTI. She was given a prescription for Percocet and encouraged to followup with the urologist that she's seen in the past in [facility].  I personally performed the services described in this documentation, which was scribed in my presence.  The recorded information has been reviewed and considered.     Malvin Johns, MD 12/22/13 0001

## 2013-12-21 NOTE — ED Notes (Addendum)
Pt c/o left flank pain which radiates around back to right flank pt reports movt increases  Pain  , pt denies urinary symtpoms

## 2014-01-09 ENCOUNTER — Telehealth: Payer: Self-pay | Admitting: Family Medicine

## 2014-01-09 NOTE — Telephone Encounter (Signed)
Yes, we discussed this last visit - ok to order and set up NCVs/EMGs both upper extremities.  Thanks!

## 2014-01-12 ENCOUNTER — Other Ambulatory Visit: Payer: Self-pay | Admitting: *Deleted

## 2014-01-12 DIAGNOSIS — G5603 Carpal tunnel syndrome, bilateral upper limbs: Secondary | ICD-10-CM

## 2014-01-17 ENCOUNTER — Encounter: Payer: Self-pay | Admitting: Family Medicine

## 2014-01-17 ENCOUNTER — Ambulatory Visit (INDEPENDENT_AMBULATORY_CARE_PROVIDER_SITE_OTHER): Payer: BC Managed Care – PPO | Admitting: Family Medicine

## 2014-01-17 ENCOUNTER — Ambulatory Visit: Payer: BC Managed Care – PPO | Admitting: Family Medicine

## 2014-01-17 VITALS — BP 111/77 | HR 69 | Ht 66.0 in | Wt 200.0 lb

## 2014-01-17 DIAGNOSIS — G5600 Carpal tunnel syndrome, unspecified upper limb: Secondary | ICD-10-CM

## 2014-01-17 DIAGNOSIS — G5603 Carpal tunnel syndrome, bilateral upper limbs: Secondary | ICD-10-CM

## 2014-01-17 MED ORDER — PREDNISONE (PAK) 10 MG PO TABS: ORAL_TABLET | ORAL | Status: DC

## 2014-01-17 NOTE — Patient Instructions (Addendum)
Start taking the prednisone as directed. Wear wrist brace as often as possible. Get the nerve conduction studies - call me 2 days following this if you haven't heard from me.  Appointment with Dr. Burney Gauze with [facility] at [address] in [place], [place]. Appointment date is February 16, 2014 at 9:30 am. Patient is aware of the date and time.

## 2014-01-19 ENCOUNTER — Encounter: Payer: Self-pay | Admitting: Family Medicine

## 2014-01-19 NOTE — Progress Notes (Addendum)
Patient ID: Andrea Briggs, female   DOB: [birth year], 38 y.o.   MRN: [id number]  PCP: No PCP Per Patient  Subjective:   HPI: Patient is a 38 y.o. female here for right hand numbness.  2/25: Patient reports she's had issues with carpal tunnel in the past. Reports on Sunday her fingers (all but the thumb) started going numb. Had some of this on left side but it went away. Hand felt swollen also. 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 Tried ibuprofen. Has a wrist splint but not allowed to wear at work. Not tried any other medicines for this. Previously did prednisone dose packs for carpal tunnel.  3/4: Patient reports she is no better following prednisone and using wrist braces. Slight swelling. Icing and taking ibuprofen now. Keeping her up at night.  3/19: Patient doing much better following injection. A little pain Monday and Tuesday but none now. No swelling. Taking ibuprofen and pain medicine occasionally.  5/11: Patient reports over past 2 weeks she's had symptoms in right hand return. Started intermittently and becoming more constant. Numbness, tightness into fingers and pain as well especially into right middle and ring fingers. Difficulty grasping items. No swelling. Getting some left hand numbness intermittently. Able to wear brace on right wrist at work now.  6/30: Patient has yet to get the NCVs/EMGs - still waiting for them to call her about these. Pain on left side only now - worse at night, can't sleep. Not allowing her to wear brace at work. Radiates into fingers with associated numbness.  Past Medical History  Diagnosis Date  . Migraine     Current Outpatient Prescriptions on File Prior to Visit  Medication Sig Dispense Refill  . hydrOXYzine (VISTARIL) 25 MG capsule       . ibuprofen (ADVIL,MOTRIN) 800 MG tablet Take 800 mg by mouth every 8 (eight) hours as needed.      . nortriptyline (PAMELOR) 10 MG capsule Take 10 mg by mouth at  bedtime.      Marland Kitchen oxyCODONE-acetaminophen (PERCOCET) 5-325 MG per tablet Take 2 tablets by mouth every 4 (four) hours as needed.  20 tablet  0  . topiramate (TOPAMAX) 15 MG capsule Take 5 mg by mouth 2 (two) times daily.       No current facility-administered medications on file prior to visit.    Past Surgical History  Procedure Laterality Date  . Hernia repair      No Known Allergies  History   Social History  . Marital Status: Single    Spouse Name: N/A    Number of Children: N/A  . Years of Education: N/A   Occupational History  . Not on file.   Social History Main Topics  . Smoking status: Never Smoker   . Smokeless tobacco: Not on file  . Alcohol Use: No  . Drug Use: No  . Sexual Activity: No   Other Topics Concern  . Not on file   Social History Narrative  . No narrative on file    History reviewed. No pertinent family history.  BP 111/77  Pulse 69  Ht 5\' 6"  (1.676 m)  Wt 200 lb (90.719 kg)  BMI 32.30 kg/m2  LMP 12/14/2013  Review of Systems: See HPI above.    Objective:  Physical Exam:  Gen: NAD  Left hand/wrist: No gross deformity, swelling, bruising. TTP volar wrist over carpal tunnel.  No other tenderness. FROM wrist and digits.  4/5 strength finger abduction, 5/5  other muscles. Positive tinels.  Pain with phalens - unable to complete due to pain. Sensation intact currently.  Assessment & Plan:  1. Bilateral hand numbness - 2/2 carpal tunnel syndrome.  Left side is symptomatic currently.  Continue with wrist brace.  Will try prednisone dose pack - would wait on NCV results before repeating injection.    Addendum:  NCV's/EMGs reviewed and discussed with patient.  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  She states right side is worse currently compared to a few weeks ago.  She would like to discuss with surgery - will set up referral.  Out of work in meantime - continue using wrist braces.

## 2014-01-19 NOTE — Assessment & Plan Note (Signed)
Left side is symptomatic currently.  Continue with wrist brace.  Will try prednisone dose pack - would wait on NCV results before repeating injection.

## 2014-01-24 ENCOUNTER — Encounter: Payer: Self-pay | Admitting: *Deleted

## 2014-01-24 ENCOUNTER — Telehealth: Payer: Self-pay | Admitting: *Deleted

## 2014-01-24 NOTE — Telephone Encounter (Signed)
Ok to write a note for her to be out of work through 7/17.  Thanks!

## 2014-01-24 NOTE — Telephone Encounter (Signed)
Patient called about getting a note to be out of work due to the pain until she goes for the Nerve Conduction Study on the 17th of July.

## 2014-02-03 ENCOUNTER — Encounter: Payer: Self-pay | Admitting: *Deleted

## 2014-02-10 ENCOUNTER — Other Ambulatory Visit: Payer: Self-pay | Admitting: *Deleted

## 2014-02-10 ENCOUNTER — Encounter: Payer: Self-pay | Admitting: Family Medicine

## 2014-02-10 DIAGNOSIS — G5603 Carpal tunnel syndrome, bilateral upper limbs: Secondary | ICD-10-CM

## 2014-07-03 ENCOUNTER — Encounter (HOSPITAL_BASED_OUTPATIENT_CLINIC_OR_DEPARTMENT_OTHER): Payer: Self-pay | Admitting: Family Medicine

## 2014-07-03 ENCOUNTER — Emergency Department (HOSPITAL_BASED_OUTPATIENT_CLINIC_OR_DEPARTMENT_OTHER)
Admission: EM | Admit: 2014-07-03 | Discharge: 2014-07-03 | Disposition: A | Discharge status: Home / Self Care | Payer: BC Managed Care – PPO | Attending: Emergency Medicine | Admitting: Emergency Medicine

## 2014-07-03 DIAGNOSIS — G43909 Migraine, unspecified, not intractable, without status migrainosus: Secondary | ICD-10-CM | POA: Diagnosis not present

## 2014-07-03 DIAGNOSIS — Z7952 Long term (current) use of systemic steroids: Secondary | ICD-10-CM | POA: Insufficient documentation

## 2014-07-03 DIAGNOSIS — B354 Tinea corporis: Secondary | ICD-10-CM | POA: Diagnosis not present

## 2014-07-03 DIAGNOSIS — R21 Rash and other nonspecific skin eruption: Secondary | ICD-10-CM | POA: Diagnosis present

## 2014-07-03 DIAGNOSIS — Z79899 Other long term (current) drug therapy: Secondary | ICD-10-CM | POA: Insufficient documentation

## 2014-07-03 MED ORDER — CLOTRIMAZOLE 1 % EX CREA: TOPICAL_CREAM | CUTANEOUS | Status: DC

## 2014-07-03 NOTE — ED Notes (Addendum)
Pt c/o rash to left breast x 3 wks. Denies fever, chills, n/v. Denies pain, tenderness, discharge. Pt sts she has been using hydrocortisone at home and area has since started itching.

## 2014-07-03 NOTE — ED Provider Notes (Signed)
CSN: [id number]     Arrival date & time 07/03/14  1043 History   First MD Initiated Contact with Patient 07/03/14 1116     Chief Complaint  Patient presents with  . Rash     (Consider location/radiation/quality/duration/timing/severity/associated sxs/prior Treatment) HPI Comments: Patient is a 38 yo F presenting to the ED for a rash to her left breast for the last three weeks. She states it is itching, but not painful. She should hydrocortisone cream with no improvement. No modifying factors identified. Denies any fevers, chills, nausea, vomiting, chest pain, shortness of breath, breast mass, nipple discharge.   Patient is a 38 y.o. female presenting with rash.  Rash   Past Medical History  Diagnosis Date  . Migraine    Past Surgical History  Procedure Laterality Date  . Hernia repair    . Carpel tunnel     No family history on file. History  Substance Use Topics  . Smoking status: Never Smoker   . Smokeless tobacco: Not on file  . Alcohol Use: No   OB History    No data available     Review of Systems  Skin: Positive for rash.  All other systems reviewed and are negative.     Allergies  Review of patient's allergies indicates no known allergies.  Home Medications   Prior to Admission medications   Medication Sig Start Date End Date Taking? Authorizing Provider  clotrimazole (LOTRIMIN) 1 % cream Apply to affected area 2 times daily 07/03/14   Stephani Police Termaine Roupp, PA-C  hydrOXYzine (VISTARIL) 25 MG capsule  06/23/13   Historical Provider, MD  ibuprofen (ADVIL,MOTRIN) 800 MG tablet Take 800 mg by mouth every 8 (eight) hours as needed.    Historical Provider, MD  nortriptyline (PAMELOR) 10 MG capsule Take 10 mg by mouth at bedtime.    Historical Provider, MD  oxyCODONE-acetaminophen (PERCOCET) 5-325 MG per tablet Take 2 tablets by mouth every 4 (four) hours as needed. 12/21/13   Malvin Johns, MD  predniSONE (STERAPRED UNI-PAK) 10 MG tablet 6 tabs po day 1, 5 tabs  po day 2, 4 tabs po day 3, 3 tabs po day 4, 2 tabs po day 5, 1 tab po day 6 01/17/14   Dene Gentry, MD  topiramate (TOPAMAX) 15 MG capsule Take 5 mg by mouth 2 (two) times daily.    Historical Provider, MD   BP 122/80 mmHg  Pulse 72  Temp(Src) 98.5 F (36.9 C) (Oral)  Resp 18  Ht 5\' 6"  (1.676 m)  Wt 204 lb (92.534 kg)  BMI 32.94 kg/m2  SpO2 100%  LMP 06/09/2014 (Approximate) Physical Exam  Constitutional: She is oriented to person, place, and time. She appears well-developed and well-nourished. No distress.  HENT:  Head: Normocephalic and atraumatic.  Right Ear: External ear normal.  Left Ear: External ear normal.  Nose: Nose normal.  Mouth/Throat: Oropharynx is clear and moist.  Eyes: Conjunctivae are normal.  Neck: Normal range of motion. Neck supple.  Cardiovascular: Normal rate, regular rhythm and normal heart sounds.   Pulmonary/Chest: Effort normal and breath sounds normal. Left breast exhibits no inverted nipple, no mass, no nipple discharge and no tenderness.    Abdominal: Soft.  Musculoskeletal: Normal range of motion.  Neurological: She is alert and oriented to person, place, and time.  Skin: Skin is warm and dry. She is not diaphoretic.  Psychiatric: She has a normal mood and affect.  Nursing note and vitals reviewed.   ED Course  Procedures (  including critical care time) Medications - No data to display  Labs Review Labs Reviewed - No data to display  Imaging Review No results found.   EKG Interpretation None      MDM   Final diagnoses:  Tinea corporis    Filed Vitals:   07/03/14 1050  BP: 122/80  Pulse: 72  Temp: 98.5 F (36.9 C)  Resp: 18   Afebrile, NAD, non-toxic appearing, AAOx4.   Rash consistent with tinea corporis. No erythema or warmth to suggest infection. No fluctuance or induration. Will prescribe Lotrimin. Advised PCP f/u. Return precautions discussed. Patient is agreeable to plan.  Patient is stable at time of  discharge     Harlow Mares, PA-C 07/03/14 [place], MD 07/04/14 [phone]

## 2014-07-03 NOTE — Discharge Instructions (Signed)
Please follow up with your primary care physician in 1-2 days. If you do not have one please call the [place] number listed above. Please use Lotrimin as prescribed. Please read all discharge instructions and return precautions.    Body Ringworm Ringworm (tinea corporis) is a fungal infection of the skin on the body. This infection is not caused by worms, but is actually caused by a fungus. Fungus normally lives on the top of your skin and can be useful. However, in the case of ringworms, the fungus grows out of control and causes a skin infection. It can involve any area of skin on the body and can spread easily from one person to another (contagious). Ringworm is a common problem for children, but it can affect adults as well. Ringworm is also often found in athletes, especially wrestlers who share equipment and mats.  CAUSES  Ringworm of the body is caused by a fungus called dermatophyte. It can spread by:  Touchingother people who are infected.  Touchinginfected pets.  Touching or sharingobjects that have been in contact with the infected person or pet (hats, combs, towels, clothing, sports equipment). SYMPTOMS   Itchy, raised red spots and bumps on the skin.  Ring-shaped rash.  Redness near the border of the rash with a clear center.  Dry and scaly skin on or around the rash. Not every person develops a ring-shaped rash. Some develop only the red, scaly patches. DIAGNOSIS  Most often, ringworm can be diagnosed by performing a skin exam. Your caregiver may choose to take a skin scraping from the affected area. The sample will be examined under the microscope to see if the fungus is present.  TREATMENT  Body ringworm may be treated with a topical antifungal cream or ointment. Sometimes, an antifungal shampoo that can be used on your body is prescribed. You may be prescribed antifungal medicines to take by mouth if your ringworm is severe, keeps coming back, or  lasts a long time.  HOME CARE INSTRUCTIONS   Only take over-the-counter or prescription medicines as directed by your caregiver.  Wash the infected area and dry it completely before applying yourcream or ointment.  When using antifungal shampoo to treat the ringworm, leave the shampoo on the body for 3-5 minutes before rinsing.   Wear loose clothing to stop clothes from rubbing and irritating the rash.  Wash or change your bed sheets every night while you have the rash.  Have your pet treated by your veterinarian if it has the same infection. To prevent ringworm:   Practice good hygiene.  Wear sandals or shoes in public places and showers.  Do not share personal items with others.  Avoid touching red patches of skin on other people.  Avoid touching pets that have bald spots or wash your hands after doing so. SEEK MEDICAL CARE IF:   Your rash continues to spread after 7 days of treatment.  Your rash is not gone in 4 weeks.  The area around your rash becomes red, warm, tender, and swollen. Document Released: 07/04/2000 Document Revised: 03/31/2012 Document Reviewed: 01/19/2012 [facility] Patient Information 2015 [place], [place]. This information is not intended to replace advice given to you by your health care provider. Make sure you discuss any questions you have with your health care provider.

## 2014-10-10 ENCOUNTER — Emergency Department (HOSPITAL_BASED_OUTPATIENT_CLINIC_OR_DEPARTMENT_OTHER)
Admission: EM | Admit: 2014-10-10 | Discharge: 2014-10-11 | Disposition: A | Discharge status: Home / Self Care | Payer: BLUE CROSS/BLUE SHIELD | Attending: Emergency Medicine | Admitting: Emergency Medicine

## 2014-10-10 ENCOUNTER — Encounter (HOSPITAL_BASED_OUTPATIENT_CLINIC_OR_DEPARTMENT_OTHER): Payer: Self-pay | Admitting: *Deleted

## 2014-10-10 DIAGNOSIS — R51 Headache: Secondary | ICD-10-CM | POA: Diagnosis present

## 2014-10-10 DIAGNOSIS — G43909 Migraine, unspecified, not intractable, without status migrainosus: Secondary | ICD-10-CM | POA: Diagnosis not present

## 2014-10-10 DIAGNOSIS — H81399 Other peripheral vertigo, unspecified ear: Secondary | ICD-10-CM | POA: Diagnosis not present

## 2014-10-10 DIAGNOSIS — R519 Headache, unspecified: Secondary | ICD-10-CM

## 2014-10-10 DIAGNOSIS — Z79899 Other long term (current) drug therapy: Secondary | ICD-10-CM | POA: Insufficient documentation

## 2014-10-10 NOTE — ED Notes (Signed)
Pt has had a HA for 12 days. She has seen her neurologist and been treated but the HA remains. She is also c/o intermittent numbness in her hands and feet since the HA began.

## 2014-10-11 LAB — CBC WITH DIFFERENTIAL/PLATELET
BASOS PCT: 0 % (ref 0–1)
Basophils Absolute: 0 10*3/uL (ref 0.0–0.1)
EOS PCT: 3 % (ref 0–5)
Eosinophils Absolute: 0.2 10*3/uL (ref 0.0–0.7)
HEMATOCRIT: 38.8 % (ref 36.0–46.0)
HEMOGLOBIN: 12.7 g/dL (ref 12.0–15.0)
Lymphocytes Relative: 42 % (ref 12–46)
Lymphs Abs: 2.5 10*3/uL (ref 0.7–4.0)
MCH: 25.5 pg — ABNORMAL LOW (ref 26.0–34.0)
MCHC: 32.7 g/dL (ref 30.0–36.0)
MCV: 77.8 fL — ABNORMAL LOW (ref 78.0–100.0)
MONOS PCT: 9 % (ref 3–12)
Monocytes Absolute: 0.5 10*3/uL (ref 0.1–1.0)
Neutro Abs: 2.7 10*3/uL (ref 1.7–7.7)
Neutrophils Relative %: 46 % (ref 43–77)
Platelets: 270 10*3/uL (ref 150–400)
RBC: 4.99 MIL/uL (ref 3.87–5.11)
RDW: 14.9 % (ref 11.5–15.5)
WBC: 5.9 10*3/uL (ref 4.0–10.5)

## 2014-10-11 LAB — BASIC METABOLIC PANEL
ANION GAP: 6 (ref 5–15)
BUN: 15 mg/dL (ref 6–23)
CHLORIDE: 110 mmol/L (ref 96–112)
CO2: 23 mmol/L (ref 19–32)
Calcium: 8.8 mg/dL (ref 8.4–10.5)
Creatinine, Ser: 1.26 mg/dL — ABNORMAL HIGH (ref 0.50–1.10)
GFR, EST AFRICAN AMERICAN: 61 mL/min — AB (ref >90)
GFR, EST NON AFRICAN AMERICAN: 53 mL/min — AB (ref >90)
Glucose, Bld: 101 mg/dL — ABNORMAL HIGH (ref 70–99)
Potassium: 3.8 mmol/L (ref 3.5–5.1)
Sodium: 139 mmol/L (ref 135–145)

## 2014-10-11 MED ORDER — SODIUM CHLORIDE 0.9 % IV BOLUS (SEPSIS)
1000.0000 mL | Freq: Once | INTRAVENOUS | Status: AC
  Administered 2014-10-11: 1000 mL via INTRAVENOUS

## 2014-10-11 MED ORDER — DIPHENHYDRAMINE HCL 50 MG/ML IJ SOLN
25.0000 mg | Freq: Once | INTRAMUSCULAR | Status: AC
  Administered 2014-10-11: 25 mg via INTRAVENOUS
  Filled 2014-10-11: qty 1

## 2014-10-11 MED ORDER — MECLIZINE HCL 25 MG PO TABS: 25.0000 mg | ORAL_TABLET | Freq: Three times a day (TID) | ORAL | Status: DC | PRN

## 2014-10-11 MED ORDER — METOCLOPRAMIDE HCL 5 MG/ML IJ SOLN
10.0000 mg | Freq: Once | INTRAMUSCULAR | Status: AC
  Administered 2014-10-11: 10 mg via INTRAVENOUS
  Filled 2014-10-11: qty 2

## 2014-10-11 NOTE — ED Provider Notes (Addendum)
CSN: [id number]     Arrival date & time 10/10/14  2223 History   First MD Initiated Contact with Patient 10/11/14 0040     Chief Complaint  Patient presents with  . Headache     (Consider location/radiation/quality/duration/timing/severity/associated sxs/prior Treatment) HPI  This is a 39 year old female with a history of migraines. Since March 10 of this year she has had a number of symptoms. First she has had recurrent migraine headaches. She describes these as being in the bilateral frontal area of her head. She rates her current pain as a 4 out of 10. Migraines are associated with vertigo, worse with head movement, nausea and paresthesias of the hands and feet. She also complains of generalized weakness, fatigue and weight loss despite no loss of appetite. She has seen her OB/GYN, primary care physician and neurologist without a formal diagnosis or adequate treatment of her symptoms. She has been also having blurred vision and eye pain for which she has an appointment with her eye doctor next week.  Her principal symptoms the present time is vertigo. She denies chest pain, shortness of breath, abdominal pain, vomiting or diarrhea. She states she had lab work done a month ago, including evaluation of her thyroid function, which was unremarkable.   Past Medical History  Diagnosis Date  . Migraine    Past Surgical History  Procedure Laterality Date  . Hernia repair    . Carpel tunnel     No family history on file. History  Substance Use Topics  . Smoking status: Never Smoker   . Smokeless tobacco: Not on file  . Alcohol Use: No   OB History    No data available     Review of Systems  All other systems reviewed and are negative.   Allergies  Review of patient's allergies indicates no known allergies.  Home Medications   Prior to Admission medications   Medication Sig Start Date End Date Taking? Authorizing Provider  HYDROcodone-acetaminophen (NORCO) 10-325 MG per tablet  Take 1 tablet by mouth every 6 (six) hours as needed.   Yes Historical Provider, MD  topiramate (TOPAMAX) 15 MG capsule Take 5 mg by mouth 2 (two) times daily.   Yes Historical Provider, MD  clotrimazole (LOTRIMIN) 1 % cream Apply to affected area 2 times daily 07/03/14   Baron Sane, PA-C  hydrOXYzine (VISTARIL) 25 MG capsule  06/23/13   Historical Provider, MD  ibuprofen (ADVIL,MOTRIN) 800 MG tablet Take 800 mg by mouth every 8 (eight) hours as needed.    Historical Provider, MD  nortriptyline (PAMELOR) 10 MG capsule Take 10 mg by mouth at bedtime.    Historical Provider, MD  oxyCODONE-acetaminophen (PERCOCET) 5-325 MG per tablet Take 2 tablets by mouth every 4 (four) hours as needed. 12/21/13   Malvin Johns, MD  predniSONE (STERAPRED UNI-PAK) 10 MG tablet 6 tabs po day 1, 5 tabs po day 2, 4 tabs po day 3, 3 tabs po day 4, 2 tabs po day 5, 1 tab po day 6 01/17/14   Dene Gentry, MD   BP 126/79 mmHg  Pulse 75  Temp(Src) 98.1 F (36.7 C) (Oral)  Resp 16  Ht 5\' 6"  (1.676 m)  Wt 190 lb (86.183 kg)  BMI 30.68 kg/m2  SpO2 100%   Physical Exam  General: Well-developed, well-nourished female in no acute distress; appearance consistent with age of record HENT: normocephalic; atraumatic Eyes: pupils equal, round and reactive to light; extraocular muscles intact; no nystagmus Neck: supple Heart: regular  rate and rhythm Lungs: clear to auscultation bilaterally Abdomen: soft; nondistended; nontender; no masses or hepatosplenomegaly; bowel sounds present Extremities: No deformity; full range of motion; pulses normal Neurologic: Awake, alert and oriented; motor function intact in all extremities and symmetric; no facial droop Skin: Warm and dry Psychiatric: Flat affect    ED Course  Procedures (including critical care time)   MDM  Nursing notes and vitals signs, including pulse oximetry, reviewed.  Summary of this visit's results, reviewed by myself:  Labs:  Results for orders  placed or performed during the hospital encounter of 10/10/14 (from the past 24 hour(s))  Basic metabolic panel     Status: Abnormal   Collection Time: 10/11/14  1:05 AM  Result Value Ref Range   Sodium 139 135 - 145 mmol/L   Potassium 3.8 3.5 - 5.1 mmol/L   Chloride 110 96 - 112 mmol/L   CO2 23 19 - 32 mmol/L   Glucose, Bld 101 (H) 70 - 99 mg/dL   BUN 15 6 - 23 mg/dL   Creatinine, Ser 1.26 (H) 0.50 - 1.10 mg/dL   Calcium 8.8 8.4 - 10.5 mg/dL   GFR calc non Af Amer 53 (L) >90 mL/min   GFR calc Af Amer 61 (L) >90 mL/min   Anion gap 6 5 - 15  CBC with Differential/Platelet     Status: Abnormal   Collection Time: 10/11/14  1:05 AM  Result Value Ref Range   WBC 5.9 4.0 - 10.5 K/uL   RBC 4.99 3.87 - 5.11 MIL/uL   Hemoglobin 12.7 12.0 - 15.0 g/dL   HCT 38.8 36.0 - 46.0 %   MCV 77.8 (L) 78.0 - 100.0 fL   MCH 25.5 (L) 26.0 - 34.0 pg   MCHC 32.7 30.0 - 36.0 g/dL   RDW 14.9 11.5 - 15.5 %   Platelets 270 150 - 400 K/uL   Neutrophils Relative % 46 43 - 77 %   Neutro Abs 2.7 1.7 - 7.7 K/uL   Lymphocytes Relative 42 12 - 46 %   Lymphs Abs 2.5 0.7 - 4.0 K/uL   Monocytes Relative 9 3 - 12 %   Monocytes Absolute 0.5 0.1 - 1.0 K/uL   Eosinophils Relative 3 0 - 5 %   Eosinophils Absolute 0.2 0.0 - 0.7 K/uL   Basophils Relative 0 0 - 1 %   Basophils Absolute 0.0 0.0 - 0.1 K/uL   1:53 AM Headache and vertigo improved after IV medications. She was advised to follow-up with her neurologist and/or PCP.   Shanon Rosser, MD 10/11/14 1700  Shanon Rosser, MD 10/11/14 1749

## 2014-10-11 NOTE — ED Notes (Signed)
MD at bedside. 

## 2014-11-17 ENCOUNTER — Emergency Department (HOSPITAL_BASED_OUTPATIENT_CLINIC_OR_DEPARTMENT_OTHER): Payer: BLUE CROSS/BLUE SHIELD

## 2014-11-17 ENCOUNTER — Emergency Department (HOSPITAL_BASED_OUTPATIENT_CLINIC_OR_DEPARTMENT_OTHER)
Admission: EM | Admit: 2014-11-17 | Discharge: 2014-11-17 | Disposition: A | Discharge status: Home / Self Care | Payer: BLUE CROSS/BLUE SHIELD | Attending: Emergency Medicine | Admitting: Emergency Medicine

## 2014-11-17 DIAGNOSIS — R278 Other lack of coordination: Secondary | ICD-10-CM | POA: Insufficient documentation

## 2014-11-17 DIAGNOSIS — Z79899 Other long term (current) drug therapy: Secondary | ICD-10-CM | POA: Insufficient documentation

## 2014-11-17 DIAGNOSIS — R531 Weakness: Secondary | ICD-10-CM | POA: Diagnosis not present

## 2014-11-17 DIAGNOSIS — Z3202 Encounter for pregnancy test, result negative: Secondary | ICD-10-CM | POA: Diagnosis not present

## 2014-11-17 DIAGNOSIS — R519 Headache, unspecified: Secondary | ICD-10-CM

## 2014-11-17 DIAGNOSIS — G43909 Migraine, unspecified, not intractable, without status migrainosus: Secondary | ICD-10-CM | POA: Insufficient documentation

## 2014-11-17 DIAGNOSIS — R51 Headache: Secondary | ICD-10-CM

## 2014-11-17 DIAGNOSIS — R279 Unspecified lack of coordination: Secondary | ICD-10-CM

## 2014-11-17 LAB — URINALYSIS, ROUTINE W REFLEX MICROSCOPIC
BILIRUBIN URINE: NEGATIVE
GLUCOSE, UA: NEGATIVE mg/dL
Ketones, ur: NEGATIVE mg/dL
NITRITE: NEGATIVE
Protein, ur: NEGATIVE mg/dL
SPECIFIC GRAVITY, URINE: 1.002 — AB (ref 1.005–1.030)
UROBILINOGEN UA: 0.2 mg/dL (ref 0.0–1.0)
pH: 7.5 (ref 5.0–8.0)

## 2014-11-17 LAB — COMPREHENSIVE METABOLIC PANEL
ALBUMIN: 3.7 g/dL (ref 3.5–5.2)
ALK PHOS: 49 U/L (ref 39–117)
ALT: 13 U/L (ref 0–35)
ANION GAP: 3 — AB (ref 5–15)
AST: 14 U/L (ref 0–37)
BILIRUBIN TOTAL: 0.7 mg/dL (ref 0.3–1.2)
BUN: 15 mg/dL (ref 6–23)
CALCIUM: 8.3 mg/dL — AB (ref 8.4–10.5)
CHLORIDE: 113 mmol/L — AB (ref 96–112)
CO2: 22 mmol/L (ref 19–32)
Creatinine, Ser: 1.08 mg/dL (ref 0.50–1.10)
GFR calc non Af Amer: 64 mL/min — ABNORMAL LOW (ref >90)
GFR, EST AFRICAN AMERICAN: 74 mL/min — AB (ref >90)
Glucose, Bld: 99 mg/dL (ref 70–99)
Potassium: 3.7 mmol/L (ref 3.5–5.1)
Sodium: 138 mmol/L (ref 135–145)
Total Protein: 6.6 g/dL (ref 6.0–8.3)

## 2014-11-17 LAB — URINE MICROSCOPIC-ADD ON

## 2014-11-17 LAB — CBC WITH DIFFERENTIAL/PLATELET
Basophils Absolute: 0 10*3/uL (ref 0.0–0.1)
Basophils Relative: 1 % (ref 0–1)
EOS ABS: 0.1 10*3/uL (ref 0.0–0.7)
Eosinophils Relative: 3 % (ref 0–5)
HEMATOCRIT: 37 % (ref 36.0–46.0)
Hemoglobin: 12.3 g/dL (ref 12.0–15.0)
LYMPHS PCT: 33 % (ref 12–46)
Lymphs Abs: 1.3 10*3/uL (ref 0.7–4.0)
MCH: 26.2 pg (ref 26.0–34.0)
MCHC: 33.2 g/dL (ref 30.0–36.0)
MCV: 78.9 fL (ref 78.0–100.0)
Monocytes Absolute: 0.3 10*3/uL (ref 0.1–1.0)
Monocytes Relative: 8 % (ref 3–12)
NEUTROS ABS: 2.3 10*3/uL (ref 1.7–7.7)
Neutrophils Relative %: 55 % (ref 43–77)
Platelets: 204 10*3/uL (ref 150–400)
RBC: 4.69 MIL/uL (ref 3.87–5.11)
RDW: 15.1 % (ref 11.5–15.5)
WBC: 4.1 10*3/uL (ref 4.0–10.5)

## 2014-11-17 LAB — PREGNANCY, URINE: PREG TEST UR: NEGATIVE

## 2014-11-17 MED ORDER — KETOROLAC TROMETHAMINE 30 MG/ML IJ SOLN
30.0000 mg | Freq: Once | INTRAMUSCULAR | Status: AC
  Administered 2014-11-17: 30 mg via INTRAVENOUS
  Filled 2014-11-17: qty 1

## 2014-11-17 MED ORDER — SODIUM CHLORIDE 0.9 % IV BOLUS (SEPSIS)
1000.0000 mL | Freq: Once | INTRAVENOUS | Status: AC
  Administered 2014-11-17: 1000 mL via INTRAVENOUS

## 2014-11-17 NOTE — ED Notes (Signed)
Pt states that she has been weak and dizzy for > 2 weeks, this am awoke with severe headache. Attempted to go to work, but headache continued

## 2014-11-17 NOTE — Discharge Instructions (Signed)
As discussed, your evaluation today has been largely reassuring.  But, it is important that you monitor your condition carefully, and do not hesitate to return to the ED if you develop new, or concerning changes in your condition. ? ?Otherwise, please follow-up with your physician for appropriate ongoing care. ? ?

## 2014-11-17 NOTE — ED Provider Notes (Signed)
CSN: [id number]     Arrival date & time 11/17/14  3570 History   First MD Initiated Contact with Patient 11/17/14 [phone]     Chief Complaint  Patient presents with  . Headache     (Consider location/radiation/quality/duration/timing/severity/associated sxs/prior Treatment) HPI Patient presents with multiple concerns. For about 2 weeks the patient has felt differently from baseline. She has a history of migraine headaches, but this is different. Essentially she feels lightheaded, weak, with diffuse dysesthesia. Over the past day the patient has also developed severe headache, unusual for her. There is associated eye pain, but no visual loss, blurriness of vision. There is no photophobia. There is mild disequilibrium. No clear alleviating or exacerbating factors.  Past Medical History  Diagnosis Date  . Migraine    Past Surgical History  Procedure Laterality Date  . Hernia repair    . Carpel tunnel     No family history on file. History  Substance Use Topics  . Smoking status: Never Smoker   . Smokeless tobacco: Not on file  . Alcohol Use: No   OB History    No data available     Review of Systems  Constitutional:       Per HPI, otherwise negative  HENT:       Per HPI, otherwise negative  Respiratory:       Per HPI, otherwise negative  Cardiovascular:       Per HPI, otherwise negative  Gastrointestinal: Negative for vomiting.  Endocrine:       Negative aside from HPI  Genitourinary:       Neg aside from HPI   Musculoskeletal:       Per HPI, otherwise negative  Skin: Negative.   Neurological: Positive for weakness, light-headedness, numbness and headaches. Negative for tremors, seizures, syncope, facial asymmetry and speech difficulty.      Allergies  Review of patient's allergies indicates no known allergies.  Home Medications   Prior to Admission medications   Medication Sig Start Date End Date Taking? Authorizing Provider  HYDROcodone-acetaminophen  (NORCO) 10-325 MG per tablet Take 1 tablet by mouth every 6 (six) hours as needed.   Yes Historical Provider, MD  ibuprofen (ADVIL,MOTRIN) 800 MG tablet Take 800 mg by mouth every 8 (eight) hours as needed.   Yes Historical Provider, MD  topiramate (TOPAMAX) 15 MG capsule Take 5 mg by mouth 2 (two) times daily.   Yes Historical Provider, MD  clotrimazole (LOTRIMIN) 1 % cream Apply to affected area 2 times daily 07/03/14   Baron Sane, PA-C  meclizine (ANTIVERT) 25 MG tablet Take 1 tablet (25 mg total) by mouth 3 (three) times daily as needed for dizziness. 10/11/14   John Molpus, MD  oxyCODONE-acetaminophen (PERCOCET) 5-325 MG per tablet Take 2 tablets by mouth every 4 (four) hours as needed. 12/21/13   Malvin Johns, MD  predniSONE (STERAPRED UNI-PAK) 10 MG tablet 6 tabs po day 1, 5 tabs po day 2, 4 tabs po day 3, 3 tabs po day 4, 2 tabs po day 5, 1 tab po day 6 01/17/14   Dene Gentry, MD   BP 117/78 mmHg  Pulse 75  Temp(Src) 98.3 F (36.8 C) (Oral)  Resp 18  Ht 5\' 6"  (1.676 m)  Wt 197 lb (89.359 kg)  BMI 31.81 kg/m2  SpO2 100%  LMP  Physical Exam  Constitutional: She is oriented to person, place, and time. She appears well-developed and well-nourished. No distress.  HENT:  Head: Normocephalic and atraumatic.  Eyes: Conjunctivae and EOM are normal.  Cardiovascular: Normal rate and regular rhythm.   Pulmonary/Chest: Effort normal and breath sounds normal. No stridor. No respiratory distress.  Abdominal: She exhibits no distension.  Musculoskeletal: She exhibits no edema.  Neurological: She is alert and oriented to person, place, and time. She displays no atrophy and no tremor. No cranial nerve deficit. She exhibits normal muscle tone. She displays no seizure activity. Coordination abnormal.  Positive pronator drift  Skin: Skin is warm and dry.  Psychiatric: She has a normal mood and affect.  Nursing note and vitals reviewed.   ED Course  Procedures (including critical care  time) Labs Review Labs Reviewed  COMPREHENSIVE METABOLIC PANEL - Abnormal; Notable for the following:    Chloride 113 (*)    Calcium 8.3 (*)    GFR calc non Af Amer 64 (*)    GFR calc Af Amer 74 (*)    Anion gap 3 (*)    All other components within normal limits  URINALYSIS, ROUTINE W REFLEX MICROSCOPIC - Abnormal; Notable for the following:    Specific Gravity, Urine 1.002 (*)    Hgb urine dipstick TRACE (*)    Leukocytes, UA MODERATE (*)    All other components within normal limits  URINE MICROSCOPIC-ADD ON - Abnormal; Notable for the following:    Squamous Epithelial / LPF FEW (*)    Bacteria, UA FEW (*)    All other components within normal limits  CBC WITH DIFFERENTIAL/PLATELET  PREGNANCY, URINE    Imaging Review Ct Head Wo Contrast  11/17/2014   CLINICAL DATA:  Severe headache this morning. Weakness and dizziness for 2 weeks. History of migraines.  EXAM: CT HEAD WITHOUT CONTRAST  TECHNIQUE: Contiguous axial images were obtained from the base of the skull through the vertex without intravenous contrast.  COMPARISON:  None.  FINDINGS: The ventricles and sulci are within normal limits for age. There is no evidence of acute infarct, intracranial hemorrhage, mass, midline shift, or extra-axial collection. The orbits are unremarkable. The visualized paranasal sinuses and mastoid air cells are clear. There is no evidence of acute fracture.  IMPRESSION: Unremarkable head CT   Electronically Signed   By: Logan Bores   On: 11/17/2014 08:14    8:34 AM Patient feeling better. I reviewed all results, including CT findings with her. Patient has follow-up scheduled in 3 days, with her neurologist. She is aware of return precautions, follow-up instructions.  MDM  Patient presents with concern of ongoing weakness, dizziness, atypical headache. Patient has largely reassuring physical exam, but does have pronator drift, given the severity of her headache, CT scans performed. Findings were  reassuring, patient's symptoms improved, and she has scheduled follow-up in 3 days with neurology. Low suspicion for occult acute new injury, central nervous system process, or infection.     Carmin Muskrat, MD 11/17/14 [phone]

## 2015-03-03 ENCOUNTER — Emergency Department (HOSPITAL_BASED_OUTPATIENT_CLINIC_OR_DEPARTMENT_OTHER)
Admission: EM | Admit: 2015-03-03 | Discharge: 2015-03-03 | Disposition: A | Discharge status: Home / Self Care | Payer: BLUE CROSS/BLUE SHIELD | Attending: Emergency Medicine | Admitting: Emergency Medicine

## 2015-03-03 ENCOUNTER — Encounter (HOSPITAL_BASED_OUTPATIENT_CLINIC_OR_DEPARTMENT_OTHER): Payer: Self-pay | Admitting: Emergency Medicine

## 2015-03-03 DIAGNOSIS — N390 Urinary tract infection, site not specified: Secondary | ICD-10-CM

## 2015-03-03 DIAGNOSIS — Z3202 Encounter for pregnancy test, result negative: Secondary | ICD-10-CM | POA: Insufficient documentation

## 2015-03-03 DIAGNOSIS — G43909 Migraine, unspecified, not intractable, without status migrainosus: Secondary | ICD-10-CM | POA: Diagnosis not present

## 2015-03-03 DIAGNOSIS — Z79899 Other long term (current) drug therapy: Secondary | ICD-10-CM | POA: Diagnosis not present

## 2015-03-03 DIAGNOSIS — Z8679 Personal history of other diseases of the circulatory system: Secondary | ICD-10-CM | POA: Diagnosis not present

## 2015-03-03 DIAGNOSIS — H81391 Other peripheral vertigo, right ear: Secondary | ICD-10-CM

## 2015-03-03 DIAGNOSIS — R42 Dizziness and giddiness: Secondary | ICD-10-CM | POA: Diagnosis present

## 2015-03-03 LAB — URINALYSIS, ROUTINE W REFLEX MICROSCOPIC
Bilirubin Urine: NEGATIVE
GLUCOSE, UA: NEGATIVE mg/dL
Ketones, ur: NEGATIVE mg/dL
Nitrite: NEGATIVE
Protein, ur: NEGATIVE mg/dL
Specific Gravity, Urine: 1.014 (ref 1.005–1.030)
Urobilinogen, UA: 1 mg/dL (ref 0.0–1.0)
pH: 7 (ref 5.0–8.0)

## 2015-03-03 LAB — URINE MICROSCOPIC-ADD ON

## 2015-03-03 LAB — PREGNANCY, URINE: Preg Test, Ur: NEGATIVE

## 2015-03-03 MED ORDER — ONDANSETRON 8 MG PO TBDP
8.0000 mg | ORAL_TABLET | Freq: Once | ORAL | Status: AC
  Administered 2015-03-03: 8 mg via ORAL
  Filled 2015-03-03: qty 1

## 2015-03-03 MED ORDER — GI COCKTAIL ~~LOC~~
30.0000 mL | Freq: Once | ORAL | Status: AC
  Administered 2015-03-03: 30 mL via ORAL
  Filled 2015-03-03: qty 30

## 2015-03-03 MED ORDER — NITROFURANTOIN MONOHYD MACRO 100 MG PO CAPS
100.0000 mg | ORAL_CAPSULE | Freq: Once | ORAL | Status: AC
  Administered 2015-03-03: 100 mg via ORAL
  Filled 2015-03-03: qty 1

## 2015-03-03 MED ORDER — MECLIZINE HCL 25 MG PO TABS: 25.0000 mg | ORAL_TABLET | Freq: Three times a day (TID) | ORAL | Status: DC | PRN

## 2015-03-03 MED ORDER — NITROFURANTOIN MONOHYD MACRO 100 MG PO CAPS: 100.0000 mg | ORAL_CAPSULE | Freq: Two times a day (BID) | ORAL | Status: DC

## 2015-03-03 MED ORDER — MECLIZINE HCL 25 MG PO TABS
25.0000 mg | ORAL_TABLET | Freq: Once | ORAL | Status: AC
  Administered 2015-03-03: 25 mg via ORAL
  Filled 2015-03-03: qty 1

## 2015-03-03 NOTE — ED Notes (Addendum)
C/o dizziness x 1 week,  Rt ear pain onset on arrival to ed,  Ambulatory without diff,

## 2015-03-03 NOTE — Discharge Instructions (Signed)
Benign Positional Vertigo Vertigo means you feel like you or your surroundings are moving when they are not. Benign positional vertigo is the most common form of vertigo. Benign means that the cause of your condition is not serious. Benign positional vertigo is more common in older adults. CAUSES  Benign positional vertigo is the result of an upset in the labyrinth system. This is an area in the middle ear that helps control your balance. This may be caused by a viral infection, head injury, or repetitive motion. However, often no specific cause is found. SYMPTOMS  Symptoms of benign positional vertigo occur when you move your head or eyes in different directions. Some of the symptoms may include:  Loss of balance and falls.  Vomiting.  Blurred vision.  Dizziness.  Nausea.  Involuntary eye movements (nystagmus). DIAGNOSIS  Benign positional vertigo is usually diagnosed by physical exam. If the specific cause of your benign positional vertigo is unknown, your caregiver may perform imaging tests, such as magnetic resonance imaging (MRI) or computed tomography (CT). TREATMENT  Your caregiver may recommend movements or procedures to correct the benign positional vertigo. Medicines such as meclizine, benzodiazepines, and medicines for nausea may be used to treat your symptoms. In rare cases, if your symptoms are caused by certain conditions that affect the inner ear, you may need surgery. HOME CARE INSTRUCTIONS   Follow your caregiver's instructions.  Move slowly. Do not make sudden body or head movements.  Avoid driving.  Avoid operating heavy machinery.  Avoid performing any tasks that would be dangerous to you or others during a vertigo episode.  Drink enough fluids to keep your urine clear or pale yellow. SEEK IMMEDIATE MEDICAL CARE IF:   You develop problems with walking, weakness, numbness, or using your arms, hands, or legs.  You have difficulty speaking.  You develop  severe headaches.  Your nausea or vomiting continues or gets worse.  You develop visual changes.  Your family or friends notice any behavioral changes.  Your condition gets worse.  You have a fever.  You develop a stiff neck or sensitivity to light. MAKE SURE YOU:   Understand these instructions.  Will watch your condition.  Will get help right away if you are not doing well or get worse. Document Released: 04/14/2006 Document Revised: 09/29/2011 Document Reviewed: 03/27/2011 [employer] Patient Information 2015 [employer]. This information is not intended to replace advice given to you by your health care provider. Make sure you discuss any questions you have with your health care provider.    

## 2015-03-03 NOTE — ED Notes (Signed)
Pt reports that she wakes up dizzy x 1 week

## 2015-03-03 NOTE — ED Provider Notes (Signed)
CSN: [id number]     Arrival date & time 03/03/15  0604 History   None    Chief Complaint  Patient presents with  . Dizziness     (Consider location/radiation/quality/duration/timing/severity/associated sxs/prior Treatment) Patient is a 39 y.o. female presenting with dizziness.  Dizziness Quality:  Head spinning Severity:  Moderate Onset quality:  Sudden Timing:  Intermittent Progression:  Unchanged Chronicity:  Recurrent Context: bending over, ear pain, eye movement and head movement   Context: not with loss of consciousness and not when urinating   Relieved by:  Lying down Worsened by:  Eye movement, movement and turning head Ineffective treatments:  None tried Associated symptoms: no diarrhea, no headaches, no hearing loss, no shortness of breath, no tinnitus and no weakness   Risk factors: hx of vertigo   Risk factors: no anemia and no Meniere's disease     Past Medical History  Diagnosis Date  . Migraine    Past Surgical History  Procedure Laterality Date  . Hernia repair    . Carpel tunnel     History reviewed. No pertinent family history. Social History  Substance Use Topics  . Smoking status: Never Smoker   . Smokeless tobacco: None  . Alcohol Use: No   OB History    No data available     Review of Systems  Constitutional: Negative for fever.  HENT: Positive for ear pain. Negative for congestion, hearing loss and tinnitus.   Eyes: Negative for pain.  Respiratory: Negative for shortness of breath.   Gastrointestinal: Negative for diarrhea.  Neurological: Positive for dizziness. Negative for seizures, speech difficulty, weakness, light-headedness, numbness and headaches.  All other systems reviewed and are negative.     Allergies  Review of patient's allergies indicates no known allergies.  Home Medications   Prior to Admission medications   Medication Sig Start Date End Date Taking? Authorizing Provider  ibuprofen (ADVIL,MOTRIN) 800 MG tablet  Take 800 mg by mouth every 8 (eight) hours as needed.   Yes Historical Provider, MD  topiramate (TOPAMAX) 15 MG capsule Take 5 mg by mouth 2 (two) times daily.   Yes Historical Provider, MD  clotrimazole (LOTRIMIN) 1 % cream Apply to affected area 2 times daily 07/03/14   Baron Sane, PA-C  HYDROcodone-acetaminophen (NORCO) 10-325 MG per tablet Take 1 tablet by mouth every 6 (six) hours as needed.    Historical Provider, MD  meclizine (ANTIVERT) 25 MG tablet Take 1 tablet (25 mg total) by mouth 3 (three) times daily as needed for dizziness. 10/11/14   John Molpus, MD  oxyCODONE-acetaminophen (PERCOCET) 5-325 MG per tablet Take 2 tablets by mouth every 4 (four) hours as needed. 12/21/13   Malvin Johns, MD  predniSONE (STERAPRED UNI-PAK) 10 MG tablet 6 tabs po day 1, 5 tabs po day 2, 4 tabs po day 3, 3 tabs po day 4, 2 tabs po day 5, 1 tab po day 6 01/17/14   Dene Gentry, MD   BP 110/75 mmHg  Pulse 78  Temp(Src) 98.3 F (36.8 C) (Oral)  Resp 18  Ht 5\' 6"  (1.676 m)  Wt 190 lb (86.183 kg)  BMI 30.68 kg/m2  SpO2 98% Physical Exam  Constitutional: She is oriented to person, place, and time. She appears well-developed and well-nourished.  HENT:  Head: Normocephalic and atraumatic.  Right Ear: External ear normal. No mastoid tenderness. Tympanic membrane is not perforated and not bulging.  Left Ear: External ear normal. No mastoid tenderness. Tympanic membrane is not perforated and  not bulging.  Mouth/Throat: Oropharynx is clear and moist.  Eyes: Conjunctivae and EOM are normal. Pupils are equal, round, and reactive to light.  Neck: Normal range of motion. Neck supple.  Cardiovascular: Normal rate, regular rhythm and intact distal pulses.   Pulmonary/Chest: Effort normal and breath sounds normal. No respiratory distress. She has no wheezes. She has no rales.  Abdominal: Soft. There is no tenderness. There is no rebound and no guarding.  Extremely gassy  Musculoskeletal: Normal range  of motion.  Lymphadenopathy:    She has no cervical adenopathy.  Neurological: She is alert and oriented to person, place, and time. She has normal reflexes. She displays normal reflexes. No cranial nerve deficit. Coordination normal.  Intact gait to room  Skin: Skin is warm and dry.  Psychiatric: She has a normal mood and affect.    ED Course  Procedures (including critical care time) Labs Review Labs Reviewed  URINALYSIS, ROUTINE W REFLEX MICROSCOPIC (NOT AT [facility])  PREGNANCY, URINE    Imaging Review No results found. I, Jas Betten-RASCH,Sanaa Zilberman K, personally reviewed and evaluated these images and lab results as part of my medical decision-making.   EKG Interpretation None      MDM   Final diagnoses:  None   Medications  ondansetron (ZOFRAN-ODT) disintegrating tablet 8 mg (not administered)  gi cocktail (Maalox,Lidocaine,Donnatal) (not administered)  meclizine (ANTIVERT) tablet 25 mg (not administered)     Have treated with GI cocktail for gassiness.    Recent normal head CT for same.  No focal neuro deficits.  No trauma.  No f/c/r.  No HA.  Symptoms are consistent with peripheral vertigo.   Will treat for same    Avian Greenawalt, MD 03/03/15 0630

## 2015-06-03 ENCOUNTER — Emergency Department (HOSPITAL_BASED_OUTPATIENT_CLINIC_OR_DEPARTMENT_OTHER): Payer: BLUE CROSS/BLUE SHIELD

## 2015-06-03 ENCOUNTER — Encounter (HOSPITAL_BASED_OUTPATIENT_CLINIC_OR_DEPARTMENT_OTHER): Payer: Self-pay | Admitting: Emergency Medicine

## 2015-06-03 ENCOUNTER — Emergency Department (HOSPITAL_BASED_OUTPATIENT_CLINIC_OR_DEPARTMENT_OTHER)
Admission: EM | Admit: 2015-06-03 | Discharge: 2015-06-04 | Disposition: A | Discharge status: Home / Self Care | Payer: BLUE CROSS/BLUE SHIELD | Attending: Emergency Medicine | Admitting: Emergency Medicine

## 2015-06-03 DIAGNOSIS — Z7952 Long term (current) use of systemic steroids: Secondary | ICD-10-CM | POA: Insufficient documentation

## 2015-06-03 DIAGNOSIS — R0789 Other chest pain: Secondary | ICD-10-CM | POA: Insufficient documentation

## 2015-06-03 DIAGNOSIS — Z79899 Other long term (current) drug therapy: Secondary | ICD-10-CM | POA: Insufficient documentation

## 2015-06-03 DIAGNOSIS — G43909 Migraine, unspecified, not intractable, without status migrainosus: Secondary | ICD-10-CM | POA: Insufficient documentation

## 2015-06-03 DIAGNOSIS — G35 Multiple sclerosis: Secondary | ICD-10-CM | POA: Insufficient documentation

## 2015-06-03 LAB — CBC
HEMATOCRIT: 38.7 % (ref 36.0–46.0)
HEMOGLOBIN: 12.9 g/dL (ref 12.0–15.0)
MCH: 26 pg (ref 26.0–34.0)
MCHC: 33.3 g/dL (ref 30.0–36.0)
MCV: 78 fL (ref 78.0–100.0)
Platelets: 225 10*3/uL (ref 150–400)
RBC: 4.96 MIL/uL (ref 3.87–5.11)
RDW: 14.6 % (ref 11.5–15.5)
WBC: 6.1 10*3/uL (ref 4.0–10.5)

## 2015-06-03 LAB — BASIC METABOLIC PANEL
Anion gap: 7 (ref 5–15)
BUN: 14 mg/dL (ref 6–20)
CHLORIDE: 107 mmol/L (ref 101–111)
CO2: 22 mmol/L (ref 22–32)
Calcium: 8.5 mg/dL — ABNORMAL LOW (ref 8.9–10.3)
Creatinine, Ser: 1.07 mg/dL — ABNORMAL HIGH (ref 0.44–1.00)
GFR calc Af Amer: >60 mL/min (ref >60)
GFR calc non Af Amer: >60 mL/min (ref >60)
GLUCOSE: 98 mg/dL (ref 65–99)
Potassium: 3.8 mmol/L (ref 3.5–5.1)
SODIUM: 136 mmol/L (ref 135–145)

## 2015-06-03 LAB — TROPONIN I: Troponin I: <0.03 ng/mL (ref <0.031)

## 2015-06-03 LAB — D-DIMER, QUANTITATIVE: D-Dimer, Quant: <0.27 ug/mL-FEU (ref 0.00–0.48)

## 2015-06-03 MED ORDER — ONDANSETRON HCL 4 MG/2ML IJ SOLN
4.0000 mg | Freq: Once | INTRAMUSCULAR | Status: DC | PRN
  Administered 2015-06-03: 4 mg via INTRAVENOUS
  Filled 2015-06-03: qty 2

## 2015-06-03 MED ORDER — FENTANYL CITRATE (PF) 100 MCG/2ML IJ SOLN
100.0000 ug | Freq: Once | INTRAMUSCULAR | Status: AC
  Administered 2015-06-03: 100 ug via INTRAVENOUS
  Filled 2015-06-03: qty 2

## 2015-06-03 NOTE — ED Notes (Signed)
Pt in c/o sudden onset L breast pain that moved into her central chest, describes as sharp and non-radiating. Pt has MS.

## 2015-06-03 NOTE — ED Provider Notes (Addendum)
CSN: [id number]     Arrival date & time 06/03/15  2221 History  By signing my name below, I, Andrea Briggs, attest that this documentation has been prepared under the direction and in the presence of Shanon Rosser, MD. Electronically Signed: Helane Briggs, ED Scribe. 06/03/2015. 11:33 PM.    Chief Complaint  Patient presents with  . Chest Pain   The history is provided by the patient. No language interpreter was used.   HPI Comments: Andrea Briggs is a 39 y.o. female who presents to the Emergency Department complaining of constant, sharp, left-sided chest pain onset 30 minutes ago. She currently rates the pain as a 6/10. Pt states she was watching TV when the pain began in her left breast, then moved more towards the center. She denies exacerbation with deep breathing, but it is worse with movement or palpation. She reports taking 800 mg ibuprofen with no relief. She notes a recent diagnosis of MS which causes dizzy spells, fatigue, and pain in her feet, but never any CP; she is awaiting initiation of treatment for her MS. Pt denies SOB, nausea, leg pain or swelling and diaphoresis.   Past Medical History  Diagnosis Date  . Migraine   . Multiple sclerosis [facility])    Past Surgical History  Procedure Laterality Date  . Hernia repair    . Carpel tunnel     History reviewed. No pertinent family history. Social History  Substance Use Topics  . Smoking status: Never Smoker   . Smokeless tobacco: None  . Alcohol Use: No   OB History    No data available     Review of Systems  All other systems reviewed and are negative.   Allergies  Review of patient's allergies indicates no known allergies.  Home Medications   Prior to Admission medications   Medication Sig Start Date End Date Taking? Authorizing Provider  clotrimazole (LOTRIMIN) 1 % cream Apply to affected area 2 times daily 07/03/14   Baron Sane, PA-C  HYDROcodone-acetaminophen (NORCO) 10-325 MG per tablet Take 1  tablet by mouth every 6 (six) hours as needed.    Historical Provider, MD  ibuprofen (ADVIL,MOTRIN) 800 MG tablet Take 800 mg by mouth every 8 (eight) hours as needed.    Historical Provider, MD  meclizine (ANTIVERT) 25 MG tablet Take 1 tablet (25 mg total) by mouth 3 (three) times daily as needed for dizziness. 10/11/14   Vontae Court, MD  meclizine (ANTIVERT) 25 MG tablet Take 1 tablet (25 mg total) by mouth 3 (three) times daily as needed for dizziness. 03/03/15   April Palumbo, MD  nitrofurantoin, macrocrystal-monohydrate, (MACROBID) 100 MG capsule Take 1 capsule (100 mg total) by mouth 2 (two) times daily. X 7 days 03/03/15   April Palumbo, MD  oxyCODONE-acetaminophen (PERCOCET) 5-325 MG per tablet Take 2 tablets by mouth every 4 (four) hours as needed. 12/21/13   Malvin Johns, MD  predniSONE (STERAPRED UNI-PAK) 10 MG tablet 6 tabs po day 1, 5 tabs po day 2, 4 tabs po day 3, 3 tabs po day 4, 2 tabs po day 5, 1 tab po day 6 01/17/14   Dene Gentry, MD  topiramate (TOPAMAX) 15 MG capsule Take 5 mg by mouth 2 (two) times daily.    Historical Provider, MD   BP 119/90 mmHg  Pulse 107  Temp(Src) 97.9 F (36.6 C) (Oral)  Resp 20  Ht 5\' 6"  (1.676 m)  Wt 190 lb (86.183 kg)  BMI 30.68 kg/m2  SpO2 100%  Physical Exam General: Well-developed, well-nourished female in no acute distress; appearance consistent with age of record HENT: normocephalic; atraumatic Eyes: pupils equal, round and reactive to light; extraocular muscles intact; arcus senilis bilaterally Neck: supple Heart: regular rate and rhythm; no murmurs, rubs or gallops Lungs: clear to auscultation bilaterally Chest: left paraspinal TTP and left breast TTP, left-sided TTP all the way down to the inferior ribcage Abdomen: soft; nondistended; nontender; no masses or hepatosplenomegaly; bowel sounds present Extremities: No deformity; full range of motion; pulses normal; no edema Neurologic: Awake, alert and oriented; motor function intact  in all extremities and symmetric; no facial droop Skin: Warm and dry Psychiatric: Normal mood and affect  ED Course  Procedures   MDM   Nursing notes and vitals signs, including pulse oximetry, reviewed.  Summary of this visit's results, reviewed by myself:   EKG Interpretation  Date/Time:  Sunday June 03 2015 22:27:44 EST Ventricular Rate:  91 PR Interval:  138 QRS Duration: 70 QT Interval:  348 QTC Calculation: 428 R Axis:   21 Text Interpretation:  Normal sinus rhythm Normal ECG No old tracing to compare Confirmed by FLOYD MD, DANIEL [phone]) on 06/03/2015 10:40:04 PM       Labs:  Results for orders placed or performed during the hospital encounter of 06/03/15 (from the past 24 hour(s))  Basic metabolic panel     Status: Abnormal   Collection Time: 06/03/15 10:35 PM  Result Value Ref Range   Sodium 136 135 - 145 mmol/L   Potassium 3.8 3.5 - 5.1 mmol/L   Chloride 107 101 - 111 mmol/L   CO2 22 22 - 32 mmol/L   Glucose, Bld 98 65 - 99 mg/dL   BUN 14 6 - 20 mg/dL   Creatinine, Ser 1.07 (H) 0.44 - 1.00 mg/dL   Calcium 8.5 (L) 8.9 - 10.3 mg/dL   GFR calc non Af Amer >60 >60 mL/min   GFR calc Af Amer >60 >60 mL/min   Anion gap 7 5 - 15  CBC     Status: None   Collection Time: 06/03/15 10:35 PM  Result Value Ref Range   WBC 6.1 4.0 - 10.5 K/uL   RBC 4.96 3.87 - 5.11 MIL/uL   Hemoglobin 12.9 12.0 - 15.0 g/dL   HCT 38.7 36.0 - 46.0 %   MCV 78.0 78.0 - 100.0 fL   MCH 26.0 26.0 - 34.0 pg   MCHC 33.3 30.0 - 36.0 g/dL   RDW 14.6 11.5 - 15.5 %   Platelets 225 150 - 400 K/uL  Troponin I     Status: None   Collection Time: 06/03/15 10:35 PM  Result Value Ref Range   Troponin I <0.03 <0.031 ng/mL  D-dimer, quantitative (not at [facility])     Status: None   Collection Time: 06/03/15 10:35 PM  Result Value Ref Range   D-Dimer, Quant <0.27 0.00 - 0.48 ug/mL-FEU    Imaging Studies: Dg Chest 2 View  06/03/2015  CLINICAL DATA:  Acute onset of left breast pain, extending  to the central chest. Initial encounter. EXAM: CHEST  2 VIEW COMPARISON:  None. FINDINGS: The lungs are well-aerated and clear. There is no evidence of focal opacification, pleural effusion or pneumothorax. The heart is normal in size; the mediastinal contour is within normal limits. No acute osseous abnormalities are seen. IMPRESSION: No acute cardiopulmonary process seen. Electronically Signed   By: Garald Balding M.D.   On: 06/03/2015 23:48   11:59 PM Patient was advised to  continue taking her ibuprofen. We will add a narcotic when necessary for exacerbations.  Final diagnoses:  Chest wall pain   I personally performed the services described in this documentation, which was scribed in my presence. The recorded information has been reviewed and is accurate.   Shanon Rosser, MD 06/03/15 [place], MD 06/03/15 [id number]

## 2015-06-03 NOTE — ED Notes (Signed)
Patient transported to X-ray 

## 2015-06-03 NOTE — ED Notes (Addendum)
Assumed care of patient from [place], [place]. Pt awaiting testing and further results. Call bell within reach. Family at side. No s/s of acute distress noted. Pt here for evaluation of left sided chest pain that began PTA and is worse/tender upon palpation.

## 2015-06-04 MED ORDER — OXYCODONE-ACETAMINOPHEN 5-325 MG PO TABS: 1.0000 | ORAL_TABLET | Freq: Four times a day (QID) | ORAL | Status: DC | PRN

## 2015-06-04 NOTE — Discharge Instructions (Signed)

## 2016-03-09 ENCOUNTER — Emergency Department (HOSPITAL_BASED_OUTPATIENT_CLINIC_OR_DEPARTMENT_OTHER)
Admission: EM | Admit: 2016-03-09 | Discharge: 2016-03-09 | Disposition: A | Discharge status: Home / Self Care | Payer: BLUE CROSS/BLUE SHIELD | Attending: Dermatology | Admitting: Dermatology

## 2016-03-09 ENCOUNTER — Encounter (HOSPITAL_BASED_OUTPATIENT_CLINIC_OR_DEPARTMENT_OTHER): Payer: Self-pay | Admitting: *Deleted

## 2016-03-09 DIAGNOSIS — R202 Paresthesia of skin: Secondary | ICD-10-CM | POA: Insufficient documentation

## 2016-03-09 DIAGNOSIS — Z5321 Procedure and treatment not carried out due to patient leaving prior to being seen by health care provider: Secondary | ICD-10-CM | POA: Diagnosis not present

## 2016-03-09 LAB — URINE MICROSCOPIC-ADD ON

## 2016-03-09 LAB — URINALYSIS, ROUTINE W REFLEX MICROSCOPIC
Bilirubin Urine: NEGATIVE
GLUCOSE, UA: NEGATIVE mg/dL
Ketones, ur: NEGATIVE mg/dL
Nitrite: NEGATIVE
Protein, ur: NEGATIVE mg/dL
SPECIFIC GRAVITY, URINE: 1.025 (ref 1.005–1.030)
pH: 6 (ref 5.0–8.0)

## 2016-03-09 LAB — PREGNANCY, URINE: Preg Test, Ur: NEGATIVE

## 2016-03-09 NOTE — ED Triage Notes (Addendum)
Pt states she has a hx of MS. States today around 6pm she developed numbness with tingling to the tips of her fingers and just numbness to her hands. States this radiates up her left arm. Also c/o "gas pain" to anterior chest area. States she took her "anxiety" medication, a "gas pill" and also her MS medication. Denies any sob. Denies any other numbness/tingling anywhere else. States hx of carpal tunnel surgery to right hand but states this does not feel like her carpal tunnel pain in the past.

## 2016-03-09 NOTE — ED Notes (Signed)
RN went to bedside to assess patient. Patient stated "well its better now". Pt states she wants to go home, that "as long as they didn't rush back here telling me I had a heart attack, I'm good. That's what I was worried about". RN asked pt if she still wanted to see the physician, she said no. EDP notified.

## 2017-04-29 ENCOUNTER — Encounter (HOSPITAL_BASED_OUTPATIENT_CLINIC_OR_DEPARTMENT_OTHER): Payer: Self-pay | Admitting: Emergency Medicine

## 2017-04-29 ENCOUNTER — Emergency Department (HOSPITAL_BASED_OUTPATIENT_CLINIC_OR_DEPARTMENT_OTHER)
Admission: EM | Admit: 2017-04-29 | Discharge: 2017-04-29 | Disposition: A | Discharge status: Home / Self Care | Payer: BLUE CROSS/BLUE SHIELD | Attending: Emergency Medicine | Admitting: Emergency Medicine

## 2017-04-29 DIAGNOSIS — R52 Pain, unspecified: Secondary | ICD-10-CM | POA: Diagnosis not present

## 2017-04-29 HISTORY — DX: Other cervical disc degeneration, unspecified cervical region: M50.30

## 2017-04-29 HISTORY — DX: Other intervertebral disc degeneration, lumbar region: M51.36

## 2017-04-29 HISTORY — DX: Anxiety disorder, unspecified: F41.9

## 2017-04-29 HISTORY — DX: Dizziness and giddiness: R42

## 2017-04-29 HISTORY — DX: Other intervertebral disc degeneration, lumbar region without mention of lumbar back pain or lower extremity pain: M51.369

## 2017-04-29 LAB — CBC WITH DIFFERENTIAL/PLATELET
BASOS ABS: 0 10*3/uL (ref 0.0–0.1)
BASOS PCT: 0 %
Eosinophils Absolute: 0.2 10*3/uL (ref 0.0–0.7)
Eosinophils Relative: 5 %
HEMATOCRIT: 35.9 % — AB (ref 36.0–46.0)
Hemoglobin: 12.2 g/dL (ref 12.0–15.0)
Lymphocytes Relative: 37 %
Lymphs Abs: 1.7 10*3/uL (ref 0.7–4.0)
MCH: 25.7 pg — ABNORMAL LOW (ref 26.0–34.0)
MCHC: 34 g/dL (ref 30.0–36.0)
MCV: 75.7 fL — ABNORMAL LOW (ref 78.0–100.0)
Monocytes Absolute: 0.5 10*3/uL (ref 0.1–1.0)
Monocytes Relative: 10 %
NEUTROS ABS: 2.2 10*3/uL (ref 1.7–7.7)
Neutrophils Relative %: 48 %
PLATELETS: 235 10*3/uL (ref 150–400)
RBC: 4.74 MIL/uL (ref 3.87–5.11)
RDW: 14.7 % (ref 11.5–15.5)
WBC: 4.6 10*3/uL (ref 4.0–10.5)

## 2017-04-29 LAB — CK: Total CK: 122 U/L (ref 38–234)

## 2017-04-29 LAB — BASIC METABOLIC PANEL
ANION GAP: 6 (ref 5–15)
BUN: 11 mg/dL (ref 6–20)
CALCIUM: 8.4 mg/dL — AB (ref 8.9–10.3)
CO2: 21 mmol/L — AB (ref 22–32)
Chloride: 110 mmol/L (ref 101–111)
Creatinine, Ser: 1.19 mg/dL — ABNORMAL HIGH (ref 0.44–1.00)
GFR, EST NON AFRICAN AMERICAN: 55 mL/min — AB (ref >60)
Glucose, Bld: 106 mg/dL — ABNORMAL HIGH (ref 65–99)
POTASSIUM: 3.4 mmol/L — AB (ref 3.5–5.1)
Sodium: 137 mmol/L (ref 135–145)

## 2017-04-29 LAB — URINALYSIS, ROUTINE W REFLEX MICROSCOPIC
Bilirubin Urine: NEGATIVE
GLUCOSE, UA: NEGATIVE mg/dL
Ketones, ur: NEGATIVE mg/dL
Nitrite: NEGATIVE
PH: 6 (ref 5.0–8.0)
Protein, ur: NEGATIVE mg/dL
SPECIFIC GRAVITY, URINE: 1.01 (ref 1.005–1.030)

## 2017-04-29 LAB — URINALYSIS, MICROSCOPIC (REFLEX)

## 2017-04-29 MED ORDER — HYDROCODONE-ACETAMINOPHEN 5-325 MG PO TABS: 1.0000 | ORAL_TABLET | Freq: Four times a day (QID) | ORAL | 0 refills | Status: DC | PRN

## 2017-04-29 NOTE — ED Provider Notes (Addendum)
[place] [facility] Provider Note: Georgena Spurling, MD, FACEP  CSN: [id number] MRN: [id number] ARRIVAL: 04/29/17 at [place]: [place]  Pain   HISTORY OF PRESENT ILLNESS  04/29/17 4:04 AM Andrea Briggs is a 41 y.o. female with a history of multiple sclerosis and degenerative disc disease. She has a history of chronic pain and was previously on hydrocodone on a regular basis, per the state controlled substances database. The hydrocodone was prescribed by her former neurologist Dr. Doy Hutching. Her last prescription for hydrocodone was on August 20 for 15 tablets. She will not be able to see her new neurologist until November. She states she has had generalized muscle pain since June of this year which has acutely worsened over the last several days. She rates her pain as an 8 out of 10. She has also had episodes of lightheadedness as well as falls which are also symptoms of her multiple sclerosis. She states she has never been placed on steroids. She is on gabapentin for restless leg syndrome but states this is not helping her generalized pain.   Past Medical History:  Diagnosis Date  . Anxiety   . DDD (degenerative disc disease), cervical   . DDD (degenerative disc disease), lumbar   . Migraine   . Multiple sclerosis ([place])   . Vertigo     Past Surgical History:  Procedure Laterality Date  . carpel tunnel    . HERNIA REPAIR      No family history on file.  Social History  Substance Use Topics  . Smoking status: Never Smoker  . Smokeless tobacco: Never Used  . Alcohol use No    Prior to Admission medications   Medication Sig Start Date End Date Taking? Authorizing Provider  gabapentin (NEURONTIN) 100 MG capsule Take 100 mg by mouth 3 (three) times daily.   Yes [provider]  gabapentin (NEURONTIN) 400 MG capsule Take 400 mg by mouth at bedtime.   Yes [provider]  hydrOXYzine (ATARAX/VISTARIL) 25 MG tablet Take 25 mg by mouth 2  (two) times daily.   Yes [provider]  ibuprofen (ADVIL,MOTRIN) 800 MG tablet Take 800 mg by mouth every 8 (eight) hours as needed.   Yes [provider]  meclizine (ANTIVERT) 25 MG tablet Take 25 mg by mouth 3 (three) times daily as needed for dizziness.   Yes [provider]  medroxyPROGESTERone (DEPO-PROVERA) 150 MG/ML injection Inject 150 mg into the muscle every 3 (three) months.   Yes [provider]  Multiple Vitamin (MULTIVITAMIN) capsule Take 1 capsule by mouth daily.   Yes [provider]  sertraline (ZOLOFT) 50 MG tablet Take 50 mg by mouth daily.   Yes [provider]  tiZANidine (ZANAFLEX) 2 MG tablet Take by mouth 4 (four) times daily.   Yes [provider]  traZODone (DESYREL) 50 MG tablet Take 50 mg by mouth at bedtime. 1-3 tablets   Yes [provider]  zonisamide (ZONEGRAN) 100 MG capsule Take 300 mg by mouth daily.   Yes [provider]    Allergies Patient has no known allergies.   REVIEW OF SYSTEMS  Negative except as noted here or in the History of Present Illness.   PHYSICAL EXAMINATION  Initial Vital Signs Blood pressure (!) 146/87, pulse 83, temperature 97.9 F (36.6 C), temperature source Oral, resp. rate 16, SpO2 99 %.  Examination General: Well-developed, well-nourished female in no acute distress; appearance consistent with age of record HENT: normocephalic;  atraumatic Eyes: pupils equal, round and reactive to light; extraocular muscles intact Neck: supple Heart: regular rate and rhythm Lungs: clear to auscultation bilaterally Abdomen: soft; nondistended; nontender; bowel sounds present Extremities: No deformity; full range of motion; pulses normal; generalized muscle tenderness Neurologic: Awake, alert and oriented; motor function intact in all extremities and symmetric; no facial droop Skin: Warm and dry Psychiatric: Tearful   RESULTS  Summary of this visit's  results, reviewed by myself:   EKG Interpretation  Date/Time:    Ventricular Rate:    PR Interval:    QRS Duration:   QT Interval:    QTC Calculation:   R Axis:     Text Interpretation:        Laboratory Studies: Results for orders placed or performed during the hospital encounter of 04/29/17 (from the past 24 hour(s))  Urinalysis, Routine w reflex microscopic     Status: Abnormal   Collection Time: 04/29/17  2:05 AM  Result Value Ref Range   Color, Urine YELLOW YELLOW   APPearance CLEAR CLEAR   Specific Gravity, Urine 1.010 1.005 - 1.030   pH 6.0 5.0 - 8.0   Glucose, UA NEGATIVE NEGATIVE mg/dL   Hgb urine dipstick MODERATE (A) NEGATIVE   Bilirubin Urine NEGATIVE NEGATIVE   Ketones, ur NEGATIVE NEGATIVE mg/dL   Protein, ur NEGATIVE NEGATIVE mg/dL   Nitrite NEGATIVE NEGATIVE   Leukocytes, UA SMALL (A) NEGATIVE  Urinalysis, Microscopic (reflex)     Status: Abnormal   Collection Time: 04/29/17  2:05 AM  Result Value Ref Range   RBC / HPF 0-5 0 - 5 RBC/hpf   WBC, UA 0-5 0 - 5 WBC/hpf   Bacteria, UA RARE (A) NONE SEEN   Squamous Epithelial / LPF 0-5 (A) NONE SEEN  CBC with Differential/Platelet     Status: Abnormal   Collection Time: 04/29/17  4:20 AM  Result Value Ref Range   WBC 4.6 4.0 - 10.5 K/uL   RBC 4.74 3.87 - 5.11 MIL/uL   Hemoglobin 12.2 12.0 - 15.0 g/dL   HCT 35.9 (L) 36.0 - 46.0 %   MCV 75.7 (L) 78.0 - 100.0 fL   MCH 25.7 (L) 26.0 - 34.0 pg   MCHC 34.0 30.0 - 36.0 g/dL   RDW 14.7 11.5 - 15.5 %   Platelets 235 150 - 400 K/uL   Neutrophils Relative % 48 %   Neutro Abs 2.2 1.7 - 7.7 K/uL   Lymphocytes Relative 37 %   Lymphs Abs 1.7 0.7 - 4.0 K/uL   Monocytes Relative 10 %   Monocytes Absolute 0.5 0.1 - 1.0 K/uL   Eosinophils Relative 5 %   Eosinophils Absolute 0.2 0.0 - 0.7 K/uL   Basophils Relative 0 %   Basophils Absolute 0.0 0.0 - 0.1 K/uL  Basic metabolic panel     Status: Abnormal   Collection Time: 04/29/17  4:20 AM  Result Value Ref Range    Sodium 137 135 - 145 mmol/L   Potassium 3.4 (L) 3.5 - 5.1 mmol/L   Chloride 110 101 - 111 mmol/L   CO2 21 (L) 22 - 32 mmol/L   Glucose, Bld 106 (H) 65 - 99 mg/dL   BUN 11 6 - 20 mg/dL   Creatinine, Ser 1.19 (H) 0.44 - 1.00 mg/dL   Calcium 8.4 (L) 8.9 - 10.3 mg/dL   GFR calc non Af Amer 55 (L) >60 mL/min   GFR calc Af Amer >60 >60 mL/min   Anion gap 6 5 - 15  CK     Status: None   Collection Time: 04/29/17  4:20 AM  Result Value Ref Range   Total CK 122 38 - 234 U/L   Imaging Studies: No results found.  ED COURSE  Nursing notes and initial vitals signs, including pulse oximetry, reviewed.  Vitals:   04/29/17 0150 04/29/17 0407  BP: (!) 146/87 127/88  Pulse: 83 (!) 53  Resp: 16 16  Temp: 97.9 F (36.6 C)   TempSrc: Oral   SpO2: 99% 99%   5:07 AM Patient advised of reassuring lab studies. She does not currently have a primary care physician and she was given referral information for primary care at this facility. We will refill her hydrocodone as she has not had any in over a month. She was advised that the cause of the pain may be her multiple sclerosis but that further evaluation would be required in an outpatient setting.  PROCEDURES    ED DIAGNOSES     ICD-10-CM   1. Generalized pain R52        Javonte Elenes, Jenny Reichmann, MD 04/29/17 7943    Shanon Rosser, MD 04/29/17 2761

## 2017-04-29 NOTE — ED Notes (Signed)
Pt verbalizes understanding of d/c instructions and denies any further needs at this time. 

## 2017-04-29 NOTE — ED Notes (Signed)
Pt c/o generalized upper body pain.  She denies recent fevers or injuries.  States she has been taking her gabapentin, muscle relaxer and ibuprofen and nothing is helping.  She last had 800mg  ibuprofen around 1900, states she cannot get into neurologist until November

## 2017-04-29 NOTE — ED Triage Notes (Signed)
Pt c/o pain all over x 3 days. Pt has hx of MS but states pain has been worse over last 3 days and reports episodes of dizziness.

## 2017-06-26 ENCOUNTER — Ambulatory Visit (INDEPENDENT_AMBULATORY_CARE_PROVIDER_SITE_OTHER): Payer: BLUE CROSS/BLUE SHIELD | Admitting: Family Medicine

## 2017-06-26 ENCOUNTER — Encounter: Payer: Self-pay | Admitting: Family Medicine

## 2017-06-26 VITALS — BP 112/76 | HR 74 | Temp 98.6°F | Ht 66.0 in | Wt 217.4 lb

## 2017-06-26 DIAGNOSIS — G894 Chronic pain syndrome: Secondary | ICD-10-CM

## 2017-06-26 DIAGNOSIS — G35 Multiple sclerosis: Secondary | ICD-10-CM | POA: Insufficient documentation

## 2017-06-26 DIAGNOSIS — N189 Chronic kidney disease, unspecified: Secondary | ICD-10-CM | POA: Insufficient documentation

## 2017-06-26 DIAGNOSIS — Z23 Encounter for immunization: Secondary | ICD-10-CM

## 2017-06-26 DIAGNOSIS — G8929 Other chronic pain: Secondary | ICD-10-CM | POA: Insufficient documentation

## 2017-06-26 DIAGNOSIS — M5136 Other intervertebral disc degeneration, lumbar region: Secondary | ICD-10-CM | POA: Insufficient documentation

## 2017-06-26 DIAGNOSIS — M5030 Other cervical disc degeneration, unspecified cervical region: Secondary | ICD-10-CM | POA: Insufficient documentation

## 2017-06-26 MED ORDER — DULOXETINE HCL 30 MG PO CPEP
30.0000 mg | ORAL_CAPSULE | Freq: Every day | ORAL | 3 refills | Status: DC
Start: 2017-06-26 — End: 2017-07-16

## 2017-06-26 NOTE — Progress Notes (Signed)
Pre visit review using our clinic review tool, if applicable. No additional management support is needed unless otherwise documented below in the visit note. 

## 2017-06-26 NOTE — Addendum Note (Signed)
Addended by: Sharon Seller B on: 06/26/2017 11:02 AM   Modules accepted: Orders

## 2017-06-26 NOTE — Patient Instructions (Signed)
[email or web address]  Please consider cognitive behavioral therapy. Contact [phone] to schedule an appointment or inquire about cost/insurance coverage.  Aim to do some physical exertion for 150 minutes per week. This is typically divided into 5 days per week, 30 minutes per day. The activity should be enough to get your heart rate up. Anything is better than nothing if you have time constraints.  Yoga can be done or water aerobics. Youtube is a good resource for yoga.   Heat (pad or rice pillow in microwave) over affected area, 10-15 minutes every 2-3 hours while awake.   Let us know if you need anything.

## 2017-06-26 NOTE — Progress Notes (Signed)
Chief Complaint  Patient presents with  . Establish Care       New Patient Visit SUBJECTIVE: HPI: Andrea Briggs is an 41 y.o.female who is being seen for establishing care.  The patient has only had specialists.  Chronic all over pain for around 2-3 years. Dx'd around that time with MS, had been having symptoms for years. She uses a heating pad. Husband will massage legs. Pain is constant, having weakness because of it. Some numbness, but unsure if it is related to MS. She is getting botox injections through neurology coming soon. She used to be on hydrocodone for this issue. She is currently on Zanaflex and Neurontin. Her old neurologist thought she may have had fibromyalgia. She was tested for lupus, unsure if she was checked for other AI etiologies. She does not routinely exercise 2/2 pain.   No Known Allergies  Past Medical History:  Diagnosis Date  . Anxiety   . DDD (degenerative disc disease), cervical   . DDD (degenerative disc disease), lumbar   . Depression   . Migraine   . MS (multiple sclerosis) ([place])   . Multiple sclerosis ([place])   . Vertigo    Past Surgical History:  Procedure Laterality Date  . carpel tunnel    . HERNIA REPAIR     Social History   Socioeconomic History  . Marital status: Married  Tobacco Use  . Smoking status: Never Smoker  . Smokeless tobacco: Never Used  Substance and Sexual Activity  . Alcohol use: No  . Drug use: No  . Sexual activity: No    Birth control/protection: None   Family History  Problem Relation Age of Onset  . Cancer Maternal Grandmother        breast cancer  . Stroke Maternal Grandfather      Current Outpatient Medications:  .  DULoxetine (CYMBALTA) 30 MG capsule, Take 1 capsule (30 mg total) by mouth daily., Disp: 30 capsule, Rfl: 3 .  gabapentin (NEURONTIN) 100 MG capsule, Take 100 mg by mouth 3 (three) times daily., Disp: , Rfl:  .  gabapentin (NEURONTIN) 400 MG capsule, Take 400 mg by mouth at bedtime., Disp:  , Rfl:  .  hydrOXYzine (ATARAX/VISTARIL) 25 MG tablet, Take 25 mg by mouth 2 (two) times daily., Disp: , Rfl:  .  ibuprofen (ADVIL,MOTRIN) 800 MG tablet, Take 800 mg by mouth every 8 (eight) hours as needed., Disp: , Rfl:  .  meclizine (ANTIVERT) 25 MG tablet, Take 25 mg by mouth 3 (three) times daily as needed for dizziness., Disp: , Rfl:  .  medroxyPROGESTERone (DEPO-PROVERA) 150 MG/ML injection, Inject 150 mg into the muscle every 3 (three) months., Disp: , Rfl:  .  Multiple Vitamin (MULTIVITAMIN) capsule, Take 1 capsule by mouth daily., Disp: , Rfl:  .  tiZANidine (ZANAFLEX) 2 MG tablet, Take by mouth 4 (four) times daily., Disp: , Rfl:  .  traZODone (DESYREL) 50 MG tablet, Take 50 mg by mouth at bedtime. 1-3 tablets, Disp: , Rfl:  .  zonisamide (ZONEGRAN) 100 MG capsule, Take 300 mg by mouth daily., Disp: , Rfl:   No LMP recorded. Patient has had an injection.  ROS MSK: +diffuse muscle and jt pain  Neuro: As noted in HPI   OBJECTIVE: BP 112/76 (BP Location: Left Arm, Patient Position: Sitting, Cuff Size: Large)   Pulse 74   Temp 98.6 F (37 C) (Oral)   Ht 5\' 6"  (1.676 m)   Wt 217 lb 6 oz (98.6 kg)  SpO2 95%   BMI 35.09 kg/m   Constitutional: -  VS reviewed -  Well developed, well nourished, appears stated age -  No apparent distress  Psychiatric: -  Oriented to person, place, and time -  Memory intact -  Affect and mood normal -  Fluent conversation, good eye contact -  Judgment and insight age appropriate  Eye: -  Conjunctivae clear, no discharge -  Pupils symmetric, round, reactive to light  ENMT: -  MMM    Pharynx moist, no exudate, no erythema  Neck: -  No gross swelling, no palpable masses -  Thyroid midline, not enlarged, mobile, no palpable masses  Cardiovascular: -  RRR -  No LE edema  Respiratory: -  Normal respiratory effort, no accessory muscle use, no retraction -  Breath sounds equal, no wheezes, no ronchi, no crackles  Gastrointestinal: -  Bowel sounds  normal -  No tenderness, no distention, no guarding, no masses  Neurological:  -  CN II - XII grossly intact -  DTR's equal and symmetric throughout, no clonus  Musculoskeletal: -  No clubbing, no cyanosis -  Gait normal -  5/5 strength in LE's b/l, grip strength adequate; 4/5 strength with shoulder ext/int rotation, flexion/ext of elbow- notably poor effort -  Various b/l ttp throughout paraspinal msc, UE's and LE's.   Skin: -  No significant lesion on inspection -  Warm and dry to palpation   ASSESSMENT/PLAN: Chronic pain syndrome - Plan: DULoxetine (CYMBALTA) 30 MG capsule  Patient instructed to sign release of records form from her previous PCP. Change Zoloft to Cymbalta. Fibro guide info given. Encouraged to exercise and mind diet. Yoga vs water aerobics suggested. Cont heat. # for CBT given. Reinforced that our goal is increasing quality of life, not completely eliminating pain. Discussed how narcotics are not indicated in FM or in chronic all over pain in my opinion.  Patient should return 6 weeks. The patient voiced understanding and agreement to the plan.   [place], DO 06/26/17  10:50 AM

## 2017-07-08 ENCOUNTER — Telehealth: Payer: Self-pay | Admitting: Family Medicine

## 2017-07-08 NOTE — Telephone Encounter (Signed)
CRM for notification. See Telephone encounter for:   07/08/17.  Caller name:  Mikle Bosworth  Relation to pt: Parameds  Call back number: [phone]    Reason for call:  Checking on the status if restriction and limitation form was received, re faxing form to [phone] please fax back to [phone]

## 2017-07-10 NOTE — Telephone Encounter (Signed)
Received FMLA/STD paperwork from [facility], completed as much as possible; forwarded to provider/SLS  Per provider instructions, as a New Patient, she will need to come in for a 30-minute] appt to complete the forms; please contact patient and set-up appointment. Thank you/SLS 12/21

## 2017-07-10 NOTE — Telephone Encounter (Signed)
Pt was called and schedule for 07-16-2017 for paperwork to be filled out.

## 2017-07-16 ENCOUNTER — Encounter: Payer: Self-pay | Admitting: Family Medicine

## 2017-07-16 ENCOUNTER — Ambulatory Visit: Payer: BLUE CROSS/BLUE SHIELD | Admitting: Family Medicine

## 2017-07-16 ENCOUNTER — Telehealth: Payer: Self-pay | Admitting: Family Medicine

## 2017-07-16 VITALS — BP 110/80 | HR 87 | Temp 98.5°F | Ht 66.0 in | Wt 215.4 lb

## 2017-07-16 DIAGNOSIS — Z0289 Encounter for other administrative examinations: Secondary | ICD-10-CM

## 2017-07-16 DIAGNOSIS — M797 Fibromyalgia: Secondary | ICD-10-CM | POA: Diagnosis not present

## 2017-07-16 MED ORDER — DULOXETINE HCL 60 MG PO CPEP: 60.0000 mg | ORAL_CAPSULE | Freq: Every day | ORAL | 3 refills | Status: DC

## 2017-07-16 NOTE — Telephone Encounter (Signed)
Copied from [place]. Topic: Quick Communication - See Telephone Encounter >> Jul 16, 2017 10:22 AM Percell Belt A wrote: CRM for notification. See Telephone encounter for:   Pt called in and said that she had some questions about the paperwork.  She would not tell me any other info or the questions.  She would like a call back from the nurse  Best number [phone]    07/16/17.

## 2017-07-16 NOTE — Progress Notes (Signed)
Chief Complaint  Patient presents with  . Follow-up    complete paperwork    Subjective: Patient is a 41 y.o. female here for f/u fibromyalgia and disability form completion.  Pt on current disability which was originated by Neurology. She is requesting that our office complete it. She is on it for both FM and MS. She has difficulty standing for long periods of time, lifting >20 lbs, pushing/pulling things as well. These forms are a renewal. She has been suggested to set up with CBT for her FM, however states she has not heard back after calling in. She was started on Cymbalta 30 mg/d and it is helpful, but only for 4-5 hours at a time. Interested in increasing the dose. Tolerating well otherwise. Other symptoms from MS include HA, vertigo, balance issues, vision changes.    ROS: MSK: +Shoulder pain Neuro: As noted in HPI  Family History  Problem Relation Age of Onset  . Cancer Maternal Grandmother        breast cancer  . Stroke Maternal Grandfather    Past Medical History:  Diagnosis Date  . Anxiety   . Chronic kidney disease (CKD)    follows w nephro  . Chronic pain   . DDD (degenerative disc disease), cervical   . DDD (degenerative disc disease), lumbar   . Depression   . Migraine   . MS (multiple sclerosis) ([place])   . Vertigo    No Known Allergies  Current Outpatient Medications:  .  gabapentin (NEURONTIN) 100 MG capsule, Take 100 mg by mouth 3 (three) times daily., Disp: , Rfl:  .  gabapentin (NEURONTIN) 400 MG capsule, Take 400 mg by mouth at bedtime., Disp: , Rfl:  .  hydrOXYzine (ATARAX/VISTARIL) 25 MG tablet, Take 25 mg by mouth 2 (two) times daily., Disp: , Rfl:  .  ibuprofen (ADVIL,MOTRIN) 800 MG tablet, Take 800 mg by mouth every 8 (eight) hours as needed., Disp: , Rfl:  .  meclizine (ANTIVERT) 25 MG tablet, Take 25 mg by mouth 3 (three) times daily as needed for dizziness., Disp: , Rfl:  .  medroxyPROGESTERone (DEPO-PROVERA) 150 MG/ML injection, Inject 150 mg  into the muscle every 3 (three) months., Disp: , Rfl:  .  Multiple Vitamin (MULTIVITAMIN) capsule, Take 1 capsule by mouth daily., Disp: , Rfl:  .  tiZANidine (ZANAFLEX) 2 MG tablet, Take by mouth 4 (four) times daily., Disp: , Rfl:  .  traZODone (DESYREL) 50 MG tablet, Take 50 mg by mouth at bedtime. 1-3 tablets, Disp: , Rfl:  .  zonisamide (ZONEGRAN) 100 MG capsule, Take 300 mg by mouth daily., Disp: , Rfl:  .  DULoxetine (CYMBALTA) 60 MG capsule, Take 1 capsule (60 mg total) by mouth daily., Disp: 30 capsule, Rfl: 3  Objective: BP 110/80 (BP Location: Left Arm, Patient Position: Sitting, Cuff Size: Large)   Pulse 87   Temp 98.5 F (36.9 C) (Oral)   Ht 5\' 6"  (1.676 m)   Wt 215 lb 6 oz (97.7 kg)   SpO2 96%   BMI 34.76 kg/m  General: Awake, appears stated age HEENT: MMM, EOMi Heart: RRR Lungs: CTAB, no rales, wheezes or rhonchi. No accessory muscle use MSK: No msc group atrophy or asymmetry, 5/5 strength throughout Neuro: DTRs equal and symmetric, no cerebellar signs Psych: Age appropriate judgment and insight, normal affect and mood  Assessment and Plan: Fibromyalgia - Plan: DULoxetine (CYMBALTA) 60 MG capsule  Encounter for completion of form with patient  Orders as above. Filled out  form to best of ability. May need Neuro's input. If she wants [place] to fill out more forms, I will refer her to occ therapy for official eval.  F/u in 1 mo for med check as we are increasing the dose of her Cymbalta.  The patient voiced understanding and agreement to the plan.  [place], DO 07/16/17  11:13 AM

## 2017-07-16 NOTE — Telephone Encounter (Signed)
Paperwork completed at [facility], forwarded via fax/SLS 12/27

## 2017-07-16 NOTE — Progress Notes (Signed)
Pre visit review using our clinic review tool, if applicable. No additional management support is needed unless otherwise documented below in the visit note. 

## 2017-07-16 NOTE — Patient Instructions (Signed)
Contact the behavioral health team again at [phone] to schedule an appointment.  Take 2 tabs of 30 mg Cymbalta until you run out.   Keep moving!

## 2017-07-29 ENCOUNTER — Telehealth: Payer: Self-pay | Admitting: *Deleted

## 2017-07-29 NOTE — Telephone Encounter (Signed)
Received request for Medical Records from [email or web address]; forwarded to [place] for email/scana/SLS

## 2017-08-13 ENCOUNTER — Telehealth: Payer: Self-pay | Admitting: Family Medicine

## 2017-08-13 ENCOUNTER — Ambulatory Visit: Payer: BLUE CROSS/BLUE SHIELD | Admitting: Family Medicine

## 2017-08-13 ENCOUNTER — Encounter: Payer: Self-pay | Admitting: Family Medicine

## 2017-08-13 VITALS — BP 110/80 | HR 91 | Temp 98.1°F | Ht 66.0 in | Wt 212.0 lb

## 2017-08-13 DIAGNOSIS — M5030 Other cervical disc degeneration, unspecified cervical region: Secondary | ICD-10-CM | POA: Diagnosis not present

## 2017-08-13 DIAGNOSIS — M797 Fibromyalgia: Secondary | ICD-10-CM

## 2017-08-13 DIAGNOSIS — M5136 Other intervertebral disc degeneration, lumbar region: Secondary | ICD-10-CM | POA: Diagnosis not present

## 2017-08-13 NOTE — Patient Instructions (Signed)
Please consider counseling. Contact [phone] to schedule an appointment or inquire about cost/insurance coverage.  If you do not hear anything about your referral in the next 1-2 weeks, call our office and ask for an update.  You need to keep moving!

## 2017-08-13 NOTE — Telephone Encounter (Signed)
Patient dropped off paperwork 08/13/2017, placed in front office tray.

## 2017-08-13 NOTE — Progress Notes (Signed)
Chief Complaint  Patient presents with  . Follow-up  . Multiple Sclerosis    Pt states that she's in as a MS follow up  . Generalized Body Aches    Pt states that about 2 weeks ago GBA got worse and she's been fatigued     Subjective: Patient is a 42 y.o. female here for f/u FM.  Around 2 months ago, she was started on Cymbalta for management of her fibromyalgia.  She had improvement in her symptoms and wished to increase the dose.  She was then placed on 60 mg daily around 1 month ago.  This was initially working well, however 2 weeks ago she started having diffuse myalgias.  There was no injury or change in activity.  She does note that initially the weather changed and she stopped going to the pool for water aerobics.  She denies any swelling, new numbness/tingling, or weakness.  ROS: MSK: +muscle aches Neuro: No new numbness, tingling  Past Medical History:  Diagnosis Date  . Anxiety   . Chronic kidney disease (CKD)    follows w nephro  . Chronic pain   . DDD (degenerative disc disease), cervical   . DDD (degenerative disc disease), lumbar   . Depression   . Migraine   . MS (multiple sclerosis) ([place])   . Vertigo    Family History  Problem Relation Age of Onset  . Cancer Maternal Grandmother        breast cancer  . Stroke Maternal Grandfather    Allergies  Allergen Reactions  . Nsaids Other (See Comments)    nephropathy  . Ketorolac Other (See Comments)    Renal insufficiency  . Metoprolol Swelling   Allergies as of 08/13/2017      Reactions   Nsaids Other (See Comments)   nephropathy   Ketorolac Other (See Comments)   Renal insufficiency   Metoprolol Swelling      Medication List        Accurate as of 08/13/17 11:07 AM. Always use your most recent med list.          DULoxetine 60 MG capsule Commonly known as:  CYMBALTA Take 1 capsule (60 mg total) by mouth daily.   gabapentin 400 MG capsule Commonly known as:  NEURONTIN Take 400 mg by mouth at  bedtime.   hydrOXYzine 25 MG tablet Commonly known as:  ATARAX/VISTARIL Take 25 mg by mouth 2 (two) times daily.   ibuprofen 800 MG tablet Commonly known as:  ADVIL,MOTRIN Take 800 mg by mouth every 8 (eight) hours as needed.   meclizine 25 MG tablet Commonly known as:  ANTIVERT Take 25 mg by mouth 3 (three) times daily as needed for dizziness.   medroxyPROGESTERone 150 MG/ML injection Commonly known as:  DEPO-PROVERA Inject 150 mg into the muscle every 3 (three) months.   multivitamin capsule Take 1 capsule by mouth daily.   tiZANidine 2 MG tablet Commonly known as:  ZANAFLEX Take by mouth 4 (four) times daily.   traZODone 50 MG tablet Commonly known as:  DESYREL Take 50 mg by mouth at bedtime. 1-3 tablets   ZONEGRAN 100 MG capsule Generic drug:  zonisamide Take 300 mg by mouth daily.        Objective: BP 110/80   Pulse 91   Temp 98.1 F (36.7 C) (Oral)   Ht 5\' 6"  (1.676 m)   Wt 212 lb (96.2 kg)   SpO2 97%   BMI 34.22 kg/m  General: Awake, appears stated age  Heart: RRR Lungs: CTAB, no rales, wheezes or rhonchi. No accessory muscle use Abd: BS+, soft, +diffuse ttp, ND, no masses or organomegaly MSK: 5/5 strength throughout, +diffuse tenderness to palpation Neuro: DTR's equal and symmetric, nml sensory function throughout w exception of L hand (MS change) Psych: Age appropriate judgment and insight, normal affect and mood  Assessment and Plan: DDD (degenerative disc disease), cervical - Plan: Ambulatory referral to Pain Clinic  DDD (degenerative disc disease), lumbar - Plan: Ambulatory referral to Pain Clinic  Fibromyalgia  Orders as above. It sounds like her recent exacerbation was triggered by a change in the weather.  This likely affected her DDD and her lack of mobility worsened her fibromyalgia.  I do not believe changing her medicine at this time will be beneficial.  She has been starting to contact a counselor via phone or through a support group.   She thinks this is helpful.  I will provide her with [facility] behavioral [facility] contact information. For her DDD, will refer to the pain clinic for possible injections/other modalities of pain management. Follow-up in 3 months or sooner if she is not doing well. The patient voiced understanding and agreement to the plan.  [place], DO 08/13/17  11:05 AM

## 2017-08-18 NOTE — Telephone Encounter (Signed)
The paperwork is for patient/claimant to complete; the only item listed that would apply to [facility] would be "All medical records [not duplicates]from one year prior to alleged onset date to the present and any other relevant medical..., will refer to [place] for advice/SLS 01/29

## 2017-08-25 NOTE — Telephone Encounter (Unsigned)
Copied from [place] [id number]. Topic: Quick Communication - Office Called Patient >> Aug 24, 2017  4:28 PM Corie Ch[place], [place] wrote: Reason for CRM Patient had a missed call from the office and would like a call back at [phone]

## 2017-08-28 ENCOUNTER — Ambulatory Visit: Payer: BLUE CROSS/BLUE SHIELD | Admitting: Family Medicine

## 2017-08-28 ENCOUNTER — Encounter: Payer: Self-pay | Admitting: Family Medicine

## 2017-08-28 ENCOUNTER — Ambulatory Visit (INDEPENDENT_AMBULATORY_CARE_PROVIDER_SITE_OTHER): Payer: BLUE CROSS/BLUE SHIELD | Admitting: Family Medicine

## 2017-08-28 VITALS — BP 122/82 | HR 83 | Temp 98.1°F | Ht 66.0 in | Wt 212.2 lb

## 2017-08-28 DIAGNOSIS — G5602 Carpal tunnel syndrome, left upper limb: Secondary | ICD-10-CM

## 2017-08-28 DIAGNOSIS — M545 Low back pain, unspecified: Secondary | ICD-10-CM

## 2017-08-28 MED ORDER — PREDNISONE 20 MG PO TABS: 40.0000 mg | ORAL_TABLET | Freq: Every day | ORAL | 0 refills | Status: AC

## 2017-08-28 MED ORDER — METHYLPREDNISOLONE ACETATE 80 MG/ML IJ SUSP
80.0000 mg | Freq: Once | INTRAMUSCULAR | Status: AC
  Administered 2017-08-28: 80 mg via INTRAMUSCULAR

## 2017-08-28 NOTE — Patient Instructions (Addendum)
Ice/cold pack over area for 10-15 min twice daily.  Heat (pad or rice pillow in microwave) over affected area, 10-15 minutes twice daily.   OK to take Tylenol 1000 mg (2 extra strength tabs) or 975 mg (3 regular strength tabs) every 6 hours as needed.  Wrist cock up splints may be helpful if the splints we give you are not. Start the prednisone tomorrow.   EXERCISES  RANGE OF MOTION (ROM) AND STRETCHING EXERCISES - Low Back Prain Most people with lower back pain will find that their symptoms get worse with excessive bending forward (flexion) or arching at the lower back (extension). The exercises that will help resolve your symptoms will focus on the opposite motion.  If you have pain, numbness or tingling which travels down into your buttocks, leg or foot, the goal of the therapy is for these symptoms to move closer to your back and eventually resolve. Sometimes, these leg symptoms will get better, but your lower back pain may worsen. This is often an indication of progress in your rehabilitation. Be very alert to any changes in your symptoms and the activities in which you participated in the 24 hours prior to the change. Sharing this information with your caregiver will allow him or her to most efficiently treat your condition. These exercises may help you when beginning to rehabilitate your injury. Your symptoms may resolve with or without further involvement from your physician, physical therapist or athletic trainer. While completing these exercises, remember:   Restoring tissue flexibility helps normal motion to return to the joints. This allows healthier, less painful movement and activity.  An effective stretch should be held for at least 30 seconds.  A stretch should never be painful. You should only feel a gentle lengthening or release in the stretched tissue. FLEXION RANGE OF MOTION AND STRETCHING EXERCISES:  STRETCH - Flexion, Single Knee to Chest   Lie on a firm bed or floor with  both legs extended in front of you.  Keeping one leg in contact with the floor, bring your opposite knee to your chest. Hold your leg in place by either grabbing behind your thigh or at your knee.  Pull until you feel a gentle stretch in your low back. Hold 30 seconds.  Slowly release your grasp and repeat the exercise with the opposite side. Repeat 2 times. Complete this exercise 3 times per week.   STRETCH - Flexion, Double Knee to Chest  Lie on a firm bed or floor with both legs extended in front of you.  Keeping one leg in contact with the floor, bring your opposite knee to your chest.  Tense your stomach muscles to support your back and then lift your other knee to your chest. Hold your legs in place by either grabbing behind your thighs or at your knees.  Pull both knees toward your chest until you feel a gentle stretch in your low back. Hold 30 seconds.  Tense your stomach muscles and slowly return one leg at a time to the floor. Repeat 2 times. Complete this exercise 3 times per week.   STRETCH - Low Trunk Rotation  Lie on a firm bed or floor. Keeping your legs in front of you, bend your knees so they are both pointed toward the ceiling and your feet are flat on the floor.  Extend your arms out to the side. This will stabilize your upper body by keeping your shoulders in contact with the floor.  Gently and slowly drop both  knees together to one side until you feel a gentle stretch in your low back. Hold for 30 seconds.  Tense your stomach muscles to support your lower back as you bring your knees back to the starting position. Repeat the exercise to the other side. Repeat 2 times. Complete this exercise at least 3 times per week.   EXTENSION RANGE OF MOTION AND FLEXIBILITY EXERCISES:  STRETCH - Extension, Prone on Elbows   Lie on your stomach on the floor, a bed will be too soft. Place your palms about shoulder width apart and at the height of your head.  Place your  elbows under your shoulders. If this is too painful, stack pillows under your chest.  Allow your body to relax so that your hips drop lower and make contact more completely with the floor.  Hold this position for 30 seconds.  Slowly return to lying flat on the floor. Repeat 2 times. Complete this exercise 3 times per week.   RANGE OF MOTION - Extension, Prone Press Ups  Lie on your stomach on the floor, a bed will be too soft. Place your palms about shoulder width apart and at the height of your head.  Keeping your back as relaxed as possible, slowly straighten your elbows while keeping your hips on the floor. You may adjust the placement of your hands to maximize your comfort. As you gain motion, your hands will come more underneath your shoulders.  Hold this position 30 seconds.  Slowly return to lying flat on the floor. Repeat 2 times. Complete this exercise 3 times per week.   RANGE OF MOTION- Quadruped, Neutral Spine   Assume a hands and knees position on a firm surface. Keep your hands under your shoulders and your knees under your hips. You may place padding under your knees for comfort.  Drop your head and point your tailbone toward the ground below you. This will round out your lower back like an angry cat. Hold this position for 30 seconds.  Slowly lift your head and release your tail bone so that your back sags into a large arch, like an old horse.  Hold this position for 30 seconds.  Repeat this until you feel limber in your low back.  Now, find your "sweet spot." This will be the most comfortable position somewhere between the two previous positions. This is your neutral spine. Once you have found this position, tense your stomach muscles to support your low back.  Hold this position for 30 seconds. Repeat 2 times. Complete this exercise 3 times per week.   STRENGTHENING EXERCISES - Low Back Sprain These exercises may help you when beginning to rehabilitate your  injury. These exercises should be done near your "sweet spot." This is the neutral, low-back arch, somewhere between fully rounded and fully arched, that is your least painful position. When performed in this safe range of motion, these exercises can be used for people who have either a flexion or extension based injury. These exercises may resolve your symptoms with or without further involvement from your physician, physical therapist or athletic trainer. While completing these exercises, remember:   Muscles can gain both the endurance and the strength needed for everyday activities through controlled exercises.  Complete these exercises as instructed by your physician, physical therapist or athletic trainer. Increase the resistance and repetitions only as guided.  You may experience muscle soreness or fatigue, but the pain or discomfort you are trying to eliminate should never worsen during these  exercises. If this pain does worsen, stop and make certain you are following the directions exactly. If the pain is still present after adjustments, discontinue the exercise until you can discuss the trouble with your caregiver.  STRENGTHENING - Deep Abdominals, Pelvic Tilt   Lie on a firm bed or floor. Keeping your legs in front of you, bend your knees so they are both pointed toward the ceiling and your feet are flat on the floor.  Tense your lower abdominal muscles to press your low back into the floor. This motion will rotate your pelvis so that your tail bone is scooping upwards rather than pointing at your feet or into the floor. With a gentle tension and even breathing, hold this position for 3 seconds. Repeat 2 times. Complete this exercise 3 times per week.   STRENGTHENING - Abdominals, Crunches   Lie on a firm bed or floor. Keeping your legs in front of you, bend your knees so they are both pointed toward the ceiling and your feet are flat on the floor. Cross your arms over your  chest.  Slightly tip your chin down without bending your neck.  Tense your abdominals and slowly lift your trunk high enough to just clear your shoulder blades. Lifting higher can put excessive stress on the lower back and does not further strengthen your abdominal muscles.  Control your return to the starting position. Repeat 2 times. Complete this exercise 3 times per week.   STRENGTHENING - Quadruped, Opposite UE/LE Lift   Assume a hands and knees position on a firm surface. Keep your hands under your shoulders and your knees under your hips. You may place padding under your knees for comfort.  Find your neutral spine and gently tense your abdominal muscles so that you can maintain this position. Your shoulders and hips should form a rectangle that is parallel with the floor and is not twisted.  Keeping your trunk steady, lift your right hand no higher than your shoulder and then your left leg no higher than your hip. Make sure you are not holding your breath. Hold this position for 30 seconds.  Continuing to keep your abdominal muscles tense and your back steady, slowly return to your starting position. Repeat with the opposite arm and leg. Repeat 2 times. Complete this exercise 3 times per week.   STRENGTHENING - Abdominals and Quadriceps, Straight Leg Raise   Lie on a firm bed or floor with both legs extended in front of you.  Keeping one leg in contact with the floor, bend the other knee so that your foot can rest flat on the floor.  Find your neutral spine, and tense your abdominal muscles to maintain your spinal position throughout the exercise.  Slowly lift your straight leg off the floor about 6 inches for a count of 3, making sure to not hold your breath.  Still keeping your neutral spine, slowly lower your leg all the way to the floor. Repeat this exercise with each leg 2 times. Complete this exercise 3 times per week.  POSTURE AND BODY MECHANICS CONSIDERATIONS - Low  Back Sprain Keeping correct posture when sitting, standing or completing your activities will reduce the stress put on different body tissues, allowing injured tissues a chance to heal and limiting painful experiences. The following are general guidelines for improved posture.  While reading these guidelines, remember:  The exercises prescribed by your provider will help you have the flexibility and strength to maintain correct postures.  The correct  posture provides the best environment for your joints to work. All of your joints have less wear and tear when properly supported by a spine with good posture. This means you will experience a healthier, less painful body.  Correct posture must be practiced with all of your activities, especially prolonged sitting and standing. Correct posture is as important when doing repetitive low-stress activities (typing) as it is when doing a single heavy-load activity (lifting).  RESTING POSITIONS Consider which positions are most painful for you when choosing a resting position. If you have pain with flexion-based activities (sitting, bending, stooping, squatting), choose a position that allows you to rest in a less flexed posture. You would want to avoid curling into a fetal position on your side. If your pain worsens with extension-based activities (prolonged standing, working overhead), avoid resting in an extended position such as sleeping on your stomach. Most people will find more comfort when they rest with their spine in a more neutral position, neither too rounded nor too arched. Lying on a non-sagging bed on your side with a pillow between your knees, or on your back with a pillow under your knees will often provide some relief. Keep in mind, being in any one position for a prolonged period of time, no matter how correct your posture, can still lead to stiffness.  PROPER SITTING POSTURE In order to minimize stress and discomfort on your spine, you must  sit with correct posture. Sitting with good posture should be effortless for a healthy body. Returning to good posture is a gradual process. Many people can work toward this most comfortably by using various supports until they have the flexibility and strength to maintain this posture on their own. When sitting with proper posture, your ears will fall over your shoulders and your shoulders will fall over your hips. You should use the back of the chair to support your upper back. Your lower back will be in a neutral position, just slightly arched. You may place a small pillow or folded towel at the base of your lower back for  support.  When working at a desk, create an environment that supports good, upright posture. Without extra support, muscles tire, which leads to excessive strain on joints and other tissues. Keep these recommendations in mind:  CHAIR:  A chair should be able to slide under your desk when your back makes contact with the back of the chair. This allows you to work closely.  The chair's height should allow your eyes to be level with the upper part of your monitor and your hands to be slightly lower than your elbows.  BODY POSITION  Your feet should make contact with the floor. If this is not possible, use a foot rest.  Keep your ears over your shoulders. This will reduce stress on your neck and low back.  INCORRECT SITTING POSTURES  If you are feeling tired and unable to assume a healthy sitting posture, do not slouch or slump. This puts excessive strain on your back tissues, causing more damage and pain. Healthier options include:  Using more support, like a lumbar pillow.  Switching tasks to something that requires you to be upright or walking.  Talking a brief walk.  Lying down to rest in a neutral-spine position.  PROLONGED STANDING WHILE SLIGHTLY LEANING FORWARD  When completing a task that requires you to lean forward while standing in one place for a long time,  place either foot up on a stationary 2-4 inch high  object to help maintain the best posture. When both feet are on the ground, the lower back tends to lose its slight inward curve. If this curve flattens (or becomes too large), then the back and your other joints will experience too much stress, tire more quickly, and can cause pain.  CORRECT STANDING POSTURES Proper standing posture should be assumed with all daily activities, even if they only take a few moments, like when brushing your teeth. As in sitting, your ears should fall over your shoulders and your shoulders should fall over your hips. You should keep a slight tension in your abdominal muscles to brace your spine. Your tailbone should point down to the ground, not behind your body, resulting in an over-extended swayback posture.   INCORRECT STANDING POSTURES  Common incorrect standing postures include a forward head, locked knees and/or an excessive swayback. WALKING Walk with an upright posture. Your ears, shoulders and hips should all line-up.  PROLONGED ACTIVITY IN A FLEXED POSITION When completing a task that requires you to bend forward at your waist or lean over a low surface, try to find a way to stabilize 3 out of 4 of your limbs. You can place a hand or elbow on your thigh or rest a knee on the surface you are reaching across. This will provide you more stability, so that your muscles do not tire as quickly. By keeping your knees relaxed, or slightly bent, you will also reduce stress across your lower back. CORRECT LIFTING TECHNIQUES  DO :  Assume a wide stance. This will provide you more stability and the opportunity to get as close as possible to the object which you are lifting.  Tense your abdominals to brace your spine. Bend at the knees and hips. Keeping your back locked in a neutral-spine position, lift using your leg muscles. Lift with your legs, keeping your back straight.  Test the weight of unknown objects before  attempting to lift them.  Try to keep your elbows locked down at your sides in order get the best strength from your shoulders when carrying an object.     Always ask for help when lifting heavy or awkward objects. INCORRECT LIFTING TECHNIQUES DO NOT:   Lock your knees when lifting, even if it is a small object.  Bend and twist. Pivot at your feet or move your feet when needing to change directions.  Assume that you can safely pick up even a paperclip without proper posture.

## 2017-08-28 NOTE — Progress Notes (Signed)
Pre visit review using our clinic review tool, if applicable. No additional management support is needed unless otherwise documented below in the visit note. 

## 2017-08-28 NOTE — Progress Notes (Signed)
Musculoskeletal Exam  Patient: Andrea Briggs DOB: [birth year]  DOS: 08/28/2017  SUBJECTIVE:  Chief Complaint:   Chief Complaint  Patient presents with  . Back Pain    Andrea Briggs is a 42 y.o.  female for evaluation and treatment of his back pain.   Onset: 45 min ago, dog jumped on her and she fell back and hit a metal kennel with her low back Location: lower, b/l worse on R Character:  aching and sharp  Progression of issue:  is unchanged Associated symptoms: none Denies bowel/bladder incontinence or weakness Treatment: to date has been none.   Neurovascular symptoms: no  She is having numbness/tingling in her right thumb and pinky.  This is been going on for several days.  No injury or change in activity.  She has a history of carpal tunnel syndrome in her left hand.  She has had a decompression surgery in the past.  EMG 1 year ago was unremarkable.  She does not use any splints or do any specific stretches/exercises for her wrists.  ROS: Musculoskeletal/Extremities: +back pain Neurologic: no weakness  Past Medical History:  Diagnosis Date  . Anxiety   . Chronic kidney disease (CKD)    follows w nephro  . Chronic pain   . DDD (degenerative disc disease), cervical   . DDD (degenerative disc disease), lumbar   . Depression   . Migraine   . MS (multiple sclerosis) ([place])   . Vertigo    Past Surgical History:  Procedure Laterality Date  . carpel tunnel    . HERNIA REPAIR     Family History  Problem Relation Age of Onset  . Cancer Maternal Grandmother        breast cancer  . Stroke Maternal Grandfather    Allergies  Allergen Reactions  . Nsaids Other (See Comments)    nephropathy  . Ketorolac Other (See Comments)    Renal insufficiency  . Metoprolol Swelling    Objective:  VITAL SIGNS: BP 122/82 (BP Location: Left Arm, Patient Position: Sitting, Cuff Size: Large)   Pulse 83   Temp 98.1 F (36.7 C) (Oral)   Ht 5\' 6"  (1.676 m)   Wt 212 lb 4 oz (96.3  kg)   SpO2 97%   BMI 34.26 kg/m  Constitutional: Well formed, well developed. No acute distress. HENT: Normocephalic, atraumatic.  Thorax & Lungs:  No accessory muscle use Extremities: No clubbing. No cyanosis. No edema.  Skin: Warm. Dry. No erythema. No rash.  Musculoskeletal: low back.   Normal active range of motion: yes.   Normal passive range of motion: yes Tenderness to palpation: yes, over L3-5 lumbar paraspinal msc b/l Deformity: no Ecchymosis: no Straight leg test: negative for  Hands: grip strength adequate, no msc grp atrophy/asymmetry Neurologic: Normal sensory function. No focal deficits noted. DTR's equal and symmetry in LE's. No clonus.  Sensation intact in hands b/l, no cerebellar signs, neg Tinel's b/l, +Phalens on L Psychiatric: Normal mood. Age appropriate judgment and insight. Alert & oriented x 3.    Assessment:  Acute bilateral low back pain without sciatica - Plan: predniSONE (DELTASONE) 20 MG tablet, methylPREDNISolone acetate (DEPO-MEDROL) injection 80 mg  Carpal tunnel syndrome of left wrist  Plan: Orders as above.  Given renal function, will employ steroids for anti-inflammatory effects.  Start prednisone tomorrow.  Tylenol, heat, ice, home stretches/exercises. For her wrist, will give bilateral splints.  If no improvement, consider cockup splint over-the-counter.  If no improvement with that, she should follow-up in the  office for potential injections versus referral back to the hand team.. Follow-up as needed otherwise. The patient voiced understanding and agreement to the plan.   Palmer, DO 08/28/17  4:47 PM

## 2017-09-11 ENCOUNTER — Telehealth: Payer: Self-pay | Admitting: Family Medicine

## 2017-09-11 NOTE — Telephone Encounter (Signed)
Copied from [place] [id number]. Topic: Quick Communication - See Telephone Encounter >> Sep 11, 2017 11:57 AM Lolita Rieger, RMA wrote: CRM for notification. See Telephone encounter for:   09/11/17.Call from dr. Lavone Neri a Neurologist requesting a call back to review restrictions please call [phone]

## 2017-09-15 NOTE — Telephone Encounter (Signed)
Tried calling back, left message with his office team.

## 2017-09-18 ENCOUNTER — Telehealth: Payer: Self-pay | Admitting: Family Medicine

## 2017-09-18 NOTE — Telephone Encounter (Signed)
Copied from [place] [id number]. Topic: Quick Communication - See Telephone Encounter >> Sep 18, 2017 12:10 PM Percell Belt A wrote: CRM for notification. See Telephone encounter for: [employer] called in and said that they faxed over long term disability paperwork. Just fyi and to be on the look out     Callback number [phone]  09/18/17.

## 2017-09-18 NOTE — Telephone Encounter (Signed)
Documents Received/SLS 03/01

## 2017-09-21 NOTE — Telephone Encounter (Signed)
Documents are not "Disability" forms; they are a claim review by an [place] and they asking PCP to "review and comment" by 09/28/17 by choice of phone, fax, mail and/or email. Forwarded to provider/SLS 03/04

## 2017-09-23 ENCOUNTER — Telehealth: Payer: Self-pay | Admitting: Family Medicine

## 2017-09-23 NOTE — Telephone Encounter (Signed)
Letter for disability written on pt's behalf.

## 2017-09-23 NOTE — Telephone Encounter (Signed)
Copied from [place] [phone]. Topic: Referral - Status >> Sep 23, 2017 11:50 AM Burnis Medin, NT wrote: Reason for CRM: Patient called back and wanted to let the doctor know that the referral she received to [facility] hadn't received a referral and it needs to be resent. Pt would like a call back.   Referral was done, but patient did not want to go to [facility].  Advise if any in [facility]

## 2017-09-23 NOTE — Telephone Encounter (Signed)
Help me with this please. I see the pain referral too, but am not sure which clinic she is calling? TY.

## 2017-09-23 NOTE — Telephone Encounter (Signed)
Copied from [place] [phone]. Topic: General - Other >> Sep 23, 2017 11:47 AM Yvette Rack wrote: Reason for CRM: patient calling stating that she had received a call from [place] at [phone] she didn't say where she was calling from its about a referral to a pain clinic that they had never received a referral for pt to be seen patient states that Dr Nani Ravens was suppose to have putting in a pain management referral   Advise please I do see a referral to pain management.

## 2017-09-24 NOTE — Telephone Encounter (Signed)
All paperwork with provider's letter faxed to [employer] @[phone]/SLS 03/07

## 2017-09-24 NOTE — Telephone Encounter (Signed)
Copy & Pasted from 09/18/17 note: Me  2:41 PM  Note    Documents are not "Disability" forms; they are a claim review by an [place] and they asking PCP to "review and comment" by 09/28/17 by choice of phone, fax, mail and/or email. Forwarded to provider/SLS 03/04    September 18, 2017  Me  2:45 PM  Note    Documents Received/SLS 03/01       12:14 PM  Damita Dunnings, CMA routed this conversation to Me . Ewing, Donell Sievert, CMA    12:13 PM  Stovall, Shana A routed this conversation to [place]    12:12 PM    Buckner [place] contacted Arnell Asal A    12:12 PM  Note    Copied from [place] [id number]. Topic: Quick Communication - See Telephone Encounter >> Sep 18, 2017 12:10 PM Percell Belt A wrote: CRM for notification. See Telephone encounter for: [employer] called in and said that they faxed over long term disability paperwork. Just fyi and to be on the look out     Callback number [phone]  09/18/17.        12:10 PM    Buckner Malta contacted [place] [facility]

## 2017-11-05 ENCOUNTER — Ambulatory Visit: Payer: BLUE CROSS/BLUE SHIELD | Admitting: Family Medicine

## 2017-11-12 ENCOUNTER — Ambulatory Visit: Payer: BLUE CROSS/BLUE SHIELD | Admitting: Family Medicine

## 2017-11-12 ENCOUNTER — Encounter: Payer: Self-pay | Admitting: Family Medicine

## 2017-11-12 VITALS — BP 112/80 | HR 74 | Temp 98.4°F | Ht 66.0 in | Wt 214.0 lb

## 2017-11-12 DIAGNOSIS — M797 Fibromyalgia: Secondary | ICD-10-CM

## 2017-11-12 DIAGNOSIS — I700 Atherosclerosis of aorta: Secondary | ICD-10-CM | POA: Diagnosis not present

## 2017-11-12 DIAGNOSIS — R109 Unspecified abdominal pain: Secondary | ICD-10-CM | POA: Diagnosis not present

## 2017-11-12 MED ORDER — DICYCLOMINE HCL 10 MG PO CAPS: 10.0000 mg | ORAL_CAPSULE | Freq: Three times a day (TID) | ORAL | 2 refills | Status: DC

## 2017-11-12 MED ORDER — AMITRIPTYLINE HCL 25 MG PO TABS: 25.0000 mg | ORAL_TABLET | Freq: Every day | ORAL | 2 refills | Status: DC

## 2017-11-12 MED ORDER — ATORVASTATIN CALCIUM 40 MG PO TABS: 40.0000 mg | ORAL_TABLET | Freq: Every day | ORAL | 3 refills | Status: DC

## 2017-11-12 NOTE — Patient Instructions (Addendum)
YouTube has yoga videos.  Use Bentyl as needed for cramping.  Use the Elavil daily.   Don't get pregnant on the Lipitor. Let me know if you come off on it.   Let us know if you need anything.

## 2017-11-12 NOTE — Progress Notes (Signed)
Pre visit review using our clinic review tool, if applicable. No additional management support is needed unless otherwise documented below in the visit note. 

## 2017-11-12 NOTE — Progress Notes (Signed)
Chief Complaint  Patient presents with  . Follow-up    Subjective: Patient is a 42 y.o. female here for f/u.  Over the past several weeks, the patient has been expensing left-sided abdominal pain.  She has been having alternating constipation and diarrhea.  CT in the emergency department was unremarkable other than diverticulosis and atherosclerosis of the abdominal aorta.  She was told that this could be related to her functional type pain.  She was not given any other type of medication.  She denies any bleeding or unintentional weight loss.  She is having some nausea with vomiting.  No fevers.  She has never seen a gastroenterologist.  Her fibromyalgia is still causing issues, however is relatively well-controlled on Cymbalta 60 mg daily.  She is tolerating the medicine well.  Has been going to [facility] and doing water aerobics once per week.   ROS: Const: No fevers MSK: +pain GI: As noted in HPI  Family History  Problem Relation Age of Onset  . Cancer Maternal Grandmother        breast cancer  . Stroke Maternal Grandfather    Past Medical History:  Diagnosis Date  . Anxiety   . Chronic kidney disease (CKD)    follows w nephro  . Chronic pain   . DDD (degenerative disc disease), cervical   . DDD (degenerative disc disease), lumbar   . Depression   . Migraine   . MS (multiple sclerosis) ([place])   . Vertigo    Allergies  Allergen Reactions  . Nsaids Other (See Comments)    nephropathy  . Ketorolac Other (See Comments)    Renal insufficiency  . Metoprolol Swelling    Current Outpatient Medications:  .  DULoxetine (CYMBALTA) 60 MG capsule, Take 1 capsule (60 mg total) by mouth daily., Disp: 30 capsule, Rfl: 3 .  gabapentin (NEURONTIN) 400 MG capsule, Take 400 mg by mouth at bedtime., Disp: , Rfl:  .  hydrOXYzine (ATARAX/VISTARIL) 25 MG tablet, Take 25 mg by mouth 2 (two) times daily., Disp: , Rfl:  .  ibuprofen (ADVIL,MOTRIN) 800 MG tablet, Take 800 mg by mouth every 8  (eight) hours as needed., Disp: , Rfl:  .  meclizine (ANTIVERT) 25 MG tablet, Take 25 mg by mouth 3 (three) times daily as needed for dizziness., Disp: , Rfl:  .  medroxyPROGESTERone (DEPO-PROVERA) 150 MG/ML injection, Inject 150 mg into the muscle every 3 (three) months., Disp: , Rfl:  .  Multiple Vitamin (MULTIVITAMIN) capsule, Take 1 capsule by mouth daily., Disp: , Rfl:  .  tiZANidine (ZANAFLEX) 2 MG tablet, Take by mouth 4 (four) times daily., Disp: , Rfl:  .  traZODone (DESYREL) 50 MG tablet, Take 50 mg by mouth at bedtime. 1-3 tablets, Disp: , Rfl:  .  zonisamide (ZONEGRAN) 100 MG capsule, Take 300 mg by mouth daily., Disp: , Rfl:  .  amitriptyline (ELAVIL) 25 MG tablet, Take 1 tablet (25 mg total) by mouth at bedtime., Disp: 30 tablet, Rfl: 2 .  atorvastatin (LIPITOR) 40 MG tablet, Take 1 tablet (40 mg total) by mouth daily., Disp: 90 tablet, Rfl: 3 .  dicyclomine (BENTYL) 10 MG capsule, Take 1 capsule (10 mg total) by mouth 4 (four) times daily -  before meals and at bedtime., Disp: 30 capsule, Rfl: 2  Objective: BP 112/80 (BP Location: Right Arm, Patient Position: Sitting, Cuff Size: Large)   Pulse 74   Temp 98.4 F (36.9 C) (Oral)   Ht 5\' 6"  (1.676 m)  Wt 214 lb (97.1 kg)   SpO2 97%   BMI 34.54 kg/m  General: Awake, appears stated age HEENT: MMM, EOMi Heart: RRR Lungs: CTAB, no rales, wheezes or rhonchi. No accessory muscle use Abd: BS+, soft, TTP over left side of abd, ND, no masses or organomegaly Psych: Age appropriate judgment and insight, normal affect and mood  Assessment and Plan: Fibromyalgia  Abdominal cramping - Plan: amitriptyline (ELAVIL) 25 MG tablet, dicyclomine (BENTYL) 10 MG capsule  Aortic atherosclerosis (HCC) - Plan: atorvastatin (LIPITOR) 40 MG tablet  Cont Cymbalta. Encouraged yoga in addn to water aerobics. Start Elavil for likely IBS, Bentyl for s/s's.  Start statin for #3. Baseline labs wnl. Will recheck in 6 weeks at f/u. The patient voiced  understanding and agreement to the plan.  [place], DO 11/12/17  12:14 PM

## 2017-11-13 ENCOUNTER — Telehealth: Payer: Self-pay | Admitting: Family Medicine

## 2017-11-13 NOTE — Telephone Encounter (Signed)
Copied from [place] [id number]. Topic: Quick Communication - See Telephone Encounter >> Nov 13, 2017 11:12 AM Rutherford Nail, NT wrote: CRM for notification. See Telephone encounter for: 11/13/17. [place], [place] scheduling, called patient about the referral that was placed. Patient called back and she is being seen by neurologist and does not wish to see the pain clinic at this time. Wants to know if it is ok to close referral to pain clinic? CB#: [phone] Option 2

## 2017-11-13 NOTE — Telephone Encounter (Signed)
OK 

## 2017-12-24 ENCOUNTER — Ambulatory Visit: Payer: BLUE CROSS/BLUE SHIELD | Admitting: Family Medicine

## 2017-12-24 DIAGNOSIS — Z0289 Encounter for other administrative examinations: Secondary | ICD-10-CM

## 2018-03-05 ENCOUNTER — Ambulatory Visit: Payer: Self-pay

## 2018-03-05 NOTE — Telephone Encounter (Signed)
Incoming call from patient with complaint of left leg joint pain and dizziness.  Patient states she has fallen related to this. Patient is aware of rising slowly.  States that she will " sit on the side of the bed for ten minutes before getting up" Patient also complains right  hand numbness. Symptoms have been for the past two weeks. Feels like the room is spinning at times.  Last time it occurred it was yesterday. Not sure what is causing the dizziness.  Maybe the heat.  States she cant tolerate the high temperatures outside.  No other symptoms occurring.      Provided care advice.   Patient voiced understanding.  Appointment scheduled         For 03/10/18 with Dr.  Nani Ravens @ 09:15am.   Reason for Disposition . [1] MODERATE dizziness (e.g., interferes with normal activities) AND [2] has been evaluated by physician for this  Answer Assessment - Initial Assessment Questions 1. DESCRIPTION: "Describe your dizziness."     See3 notes 2. LIGHTHEADED: "Do you feel lightheaded?" (e.g., somewhat faint, woozy, weak upon standing)     Weak upon standing must be cool cant tolerate high temperatures 3. VERTIGO: "Do you feel like either you or the room is spinning or tilting?" (i.e. vertigo)     yes 4. SEVERITY: "How bad is it?"  "Do you feel like you are going to faint?" "Can you stand and walk?"   - MILD - walking normally   - MODERATE - interferes with normal activities (e.g., work, school)    - SEVERE - unable to stand, requires support to walk, feels like passing out now.      Moderate when it occurs 5. ONSET:  "When did the dizziness begin?"     Past 2 weeks 6. AGGRAVATING FACTORS: "Does anything make it worse?" (e.g., standing, change in head position)     Heat , get up slowly takes about ten minutes to get out of bed. 7. HEART RATE: "Can you tell me your heart rate?" "How many beats in 15 seconds?"  (Note: not all patients can do this)       *No Answer* 8. CAUSE: "What do you think is causing the  dizziness?"     Not sure heat?   9. RECURRENT SYMPTOM: "Have you had dizziness before?" If so, ask: "When was the last time?" "What happened that time?"     yesterday 10. OTHER SYMPTOMS: "Do you have any other symptoms?" (e.g., fever, chest pain, vomiting, diarrhea, bleeding)       no 11. PREGNANCY: "Is there any chance you are pregnant?" "When was your last menstrual period?"        On depo  Protocols used: DIZZINESS [facility]

## 2018-03-07 ENCOUNTER — Other Ambulatory Visit: Payer: Self-pay | Admitting: Family Medicine

## 2018-03-07 DIAGNOSIS — R109 Unspecified abdominal pain: Secondary | ICD-10-CM

## 2018-03-10 ENCOUNTER — Ambulatory Visit: Payer: BLUE CROSS/BLUE SHIELD | Admitting: Family Medicine

## 2018-03-10 ENCOUNTER — Encounter: Payer: Self-pay | Admitting: Family Medicine

## 2018-03-10 VITALS — BP 120/92 | HR 80 | Temp 98.7°F | Ht 66.0 in | Wt 222.2 lb

## 2018-03-10 DIAGNOSIS — R030 Elevated blood-pressure reading, without diagnosis of hypertension: Secondary | ICD-10-CM

## 2018-03-10 DIAGNOSIS — R42 Dizziness and giddiness: Secondary | ICD-10-CM

## 2018-03-10 DIAGNOSIS — M7062 Trochanteric bursitis, left hip: Secondary | ICD-10-CM

## 2018-03-10 DIAGNOSIS — K2900 Acute gastritis without bleeding: Secondary | ICD-10-CM | POA: Diagnosis not present

## 2018-03-10 MED ORDER — ONDANSETRON HCL 4 MG PO TABS: 4.0000 mg | ORAL_TABLET | Freq: Three times a day (TID) | ORAL | 0 refills | Status: DC | PRN

## 2018-03-10 MED ORDER — METHYLPREDNISOLONE 4 MG PO TBPK: ORAL_TABLET | ORAL | 0 refills | Status: DC

## 2018-03-10 NOTE — Progress Notes (Signed)
Chief Complaint  Patient presents with  . Pain    joints  . Nausea  . Dizziness    Andrea Briggs is 42 y.o. pt here for dizziness.  Duration: 2 days Pass out? No Spinning? No Recent illness/fever? Yes Headache? No Neurologic signs? No Change in PO intake? Yes- has been having nausea, abd pain, constipation and decreased PO intake   3 weeks of L hip pain. Thinks it is in the jt. No famhx of OA. Denies inj or change in activity. Not like her reg FM pain. Radiates down to knee.  ROS:  Neuro: As noted in HPI GI: As noted in HPI  Past Medical History:  Diagnosis Date  . Anxiety   . Chronic kidney disease (CKD)    follows w nephro  . Chronic pain   . DDD (degenerative disc disease), cervical   . DDD (degenerative disc disease), lumbar   . Depression   . Migraine   . MS (multiple sclerosis) ([place])   . Vertigo     Family History  Problem Relation Age of Onset  . Cancer Maternal Grandmother        breast cancer  . Stroke Maternal Grandfather       BP (!) 120/92 (BP Location: Left Arm, Patient Position: Sitting, Cuff Size: Large)   Pulse 80   Temp 98.7 F (37.1 C) (Oral)   Ht 5\' 6"  (1.676 m)   Wt 222 lb 4 oz (100.8 kg)   SpO2 98%   BMI 35.87 kg/m  General: Awake, alert, appears stated age Eyes: PERRLA, EOMi Ears: Patent, TM's neg b/l Heart: RRR, no murmurs, no carotid bruits Lungs: CTAB, no accessory muscle use Abd: Soft, diffusely ttp (no change from baseline), no masses or organomegaly, BS+ MSK: ttp over L greater troch bursa Neuro: No cerebellar signs Psych: Age appropriate judgment and insight, normal mood and affect  Light headed  Acute gastritis without hemorrhage, unspecified gastritis type - Plan: ondansetron (ZOFRAN) 4 MG tablet  Greater trochanteric bursitis of left hip - Plan: methylPREDNISolone (MEDROL DOSEPAK) 4 MG TBPK tablet  Elevated blood pressure reading  #1 likely 2/2 #2. Stay hydrated.  Offered inj but she prefers PO meds first.  Ice and stretching also rec'd. Tylenol. F/u in 4 weeks. Will recheck BP then. Discussed that this could be caused by her current misery. Pt voiced understanding and agreement to the plan.  [place], DO 03/10/18 12:08 PM

## 2018-03-10 NOTE — Progress Notes (Signed)
Pre visit review using our clinic review tool, if applicable. No additional management support is needed unless otherwise documented below in the visit note. 

## 2018-03-10 NOTE — Patient Instructions (Signed)
Stay hydrated!  Ice/cold pack over area for 10-15 min twice daily.  OK to take Tylenol 1000 mg (2 extra strength tabs) or 975 mg (3 regular strength tabs) every 6 hours as needed.  Stretch the area. Stay active!  Let us know if you need anything.

## 2018-03-25 ENCOUNTER — Ambulatory Visit: Payer: BLUE CROSS/BLUE SHIELD | Admitting: Family Medicine

## 2018-03-29 ENCOUNTER — Other Ambulatory Visit: Payer: Self-pay | Admitting: Family Medicine

## 2018-03-29 DIAGNOSIS — R109 Unspecified abdominal pain: Secondary | ICD-10-CM

## 2018-04-14 ENCOUNTER — Ambulatory Visit: Payer: BLUE CROSS/BLUE SHIELD | Admitting: Family Medicine

## 2018-04-22 ENCOUNTER — Ambulatory Visit: Payer: BLUE CROSS/BLUE SHIELD | Admitting: Family Medicine

## 2018-04-30 ENCOUNTER — Ambulatory Visit: Payer: BLUE CROSS/BLUE SHIELD | Admitting: Family Medicine

## 2018-04-30 ENCOUNTER — Encounter: Payer: Self-pay | Admitting: Family Medicine

## 2018-04-30 VITALS — BP 110/80 | HR 82 | Temp 98.8°F | Ht 66.0 in | Wt 219.5 lb

## 2018-04-30 DIAGNOSIS — Z23 Encounter for immunization: Secondary | ICD-10-CM

## 2018-04-30 DIAGNOSIS — M797 Fibromyalgia: Secondary | ICD-10-CM

## 2018-04-30 DIAGNOSIS — R350 Frequency of micturition: Secondary | ICD-10-CM

## 2018-04-30 DIAGNOSIS — R29898 Other symptoms and signs involving the musculoskeletal system: Secondary | ICD-10-CM

## 2018-04-30 LAB — POC URINALSYSI DIPSTICK (AUTOMATED)
GLUCOSE UA: NEGATIVE
Ketones, UA: NEGATIVE
LEUKOCYTES UA: NEGATIVE
NITRITE UA: NEGATIVE
Protein, UA: POSITIVE — AB
Spec Grav, UA: 1.025 (ref 1.010–1.025)
Urobilinogen, UA: 1 E.U./dL
pH, UA: 6 (ref 5.0–8.0)

## 2018-04-30 MED ORDER — SULFAMETHOXAZOLE-TRIMETHOPRIM 800-160 MG PO TABS: 1.0000 | ORAL_TABLET | Freq: Two times a day (BID) | ORAL | 0 refills | Status: DC

## 2018-04-30 MED ORDER — AMITRIPTYLINE HCL 50 MG PO TABS: 50.0000 mg | ORAL_TABLET | Freq: Every day | ORAL | 2 refills | Status: DC

## 2018-04-30 NOTE — Progress Notes (Signed)
Pre visit review using our clinic review tool, if applicable. No additional management support is needed unless otherwise documented below in the visit note. 

## 2018-04-30 NOTE — Progress Notes (Signed)
Chief Complaint  Patient presents with  . Follow-up    legs  . Urinary Frequency    Andrea Briggs is a 42 y.o. female here for possible UTI.  Duration: 1 week. Symptoms: urinary frequency, urinary hesitancy, urinary retention and b/l flank pain Denies: hematuria, fever, nausea, vomiting, dysuria, vaginal discharge Hx of recurrent UTI? No Denies new sexual partners.  FM still causing issues, but she is better with Elavil 25 mg/d and Cymbalta 60 mg/d. Is having some weakness in LE's 2/2 pain. LLE weakness is chronic. She does see a neurologist for her MS. Has never seen PT. Does use heat and tries to stay active as this helps with her pain.   ROS:  Constitutional: denies fever  GU: As noted in HPI  Past Medical History:  Diagnosis Date  . Anxiety   . Chronic kidney disease (CKD)    follows w nephro  . Chronic pain   . DDD (degenerative disc disease), cervical   . DDD (degenerative disc disease), lumbar   . Depression   . Migraine   . MS (multiple sclerosis) ([place])   . Vertigo     BP 110/80 (BP Location: Left Arm, Patient Position: Sitting, Cuff Size: Large)   Pulse 82   Temp 98.8 F (37.1 C) (Oral)   Ht 5\' 6"  (1.676 m)   Wt 219 lb 8 oz (99.6 kg)   SpO2 97%   BMI 35.43 kg/m  General: Awake, alert, appears stated age Heart: RRR Lungs: CTAB, normal respiratory effort, no accessory muscle usage Abd: BS+, soft, NT, ND, no masses or organomegaly MSK: +CVA tenderness, neg Lloyd's sign, +TTP over quads b/l, 5/5 strength in RLE, 4/5 strength with L knee extension Psych: Age appropriate judgment and insight  Frequent urination - Plan: POCT Urinalysis Dipstick (Automated), Urine Culture  Need for influenza vaccination - Plan: Flu Vaccine QUAD 6+ mos PF IM (Fluarix Quad PF)  Fibromyalgia  Weakness of both lower extremities - Plan: Ambulatory referral to Physical Therapy  1- empirically tx, await cx- has seen nephro for hematuria 2- Increase dose of Elavil to 50 mg/d,  exercise, heat 3- Refer to PT. Seek immediate care if pt starts to develop fevers, new/worsening symptoms, uncontrollable N/V. F/u in 2 mo. The patient voiced understanding and agreement to the plan.  [place], DO 04/30/18 8:19 AM

## 2018-04-30 NOTE — Patient Instructions (Addendum)
Stay hydrated.   Warning signs/symptoms: Uncontrollable nausea/vomiting, fevers, worsening symptoms despite treatment, confusion.  Give [place] around 2 business days to get culture back to you.  If you do not hear anything about your referral in the next 1-2 weeks, call our office and ask for an update.  Continue heat.  Let us know if you need anything.

## 2018-05-01 ENCOUNTER — Other Ambulatory Visit: Payer: Self-pay | Admitting: Family Medicine

## 2018-05-01 LAB — URINE CULTURE
MICRO NUMBER:: 91225421
SPECIMEN QUALITY: ADEQUATE

## 2018-05-20 ENCOUNTER — Telehealth: Payer: Self-pay | Admitting: *Deleted

## 2018-05-20 NOTE — Telephone Encounter (Signed)
Received Physician Orders from [facility] PT; forwarded to provider/SLS 10/31

## 2018-06-11 ENCOUNTER — Telehealth: Payer: Self-pay | Admitting: *Deleted

## 2018-06-11 NOTE — Telephone Encounter (Signed)
Received End of Care [PT] for review; forwarded to provider/SLS 11/22

## 2018-06-16 ENCOUNTER — Other Ambulatory Visit: Payer: Self-pay | Admitting: Family Medicine

## 2018-06-16 DIAGNOSIS — R109 Unspecified abdominal pain: Secondary | ICD-10-CM

## 2018-07-02 ENCOUNTER — Ambulatory Visit: Payer: BLUE CROSS/BLUE SHIELD | Admitting: Internal Medicine

## 2018-07-02 ENCOUNTER — Encounter: Payer: Self-pay | Admitting: Internal Medicine

## 2018-07-02 VITALS — BP 126/80 | HR 91 | Temp 98.1°F | Resp 16 | Ht 66.0 in | Wt 226.4 lb

## 2018-07-02 DIAGNOSIS — J069 Acute upper respiratory infection, unspecified: Secondary | ICD-10-CM | POA: Diagnosis not present

## 2018-07-02 DIAGNOSIS — B349 Viral infection, unspecified: Secondary | ICD-10-CM | POA: Diagnosis not present

## 2018-07-02 LAB — POCT INFLUENZA A/B
Influenza A, POC: NEGATIVE
Influenza B, POC: NEGATIVE

## 2018-07-02 LAB — POCT RAPID STREP A (OFFICE): Rapid Strep A Screen: NEGATIVE

## 2018-07-02 MED ORDER — AZELASTINE HCL 0.1 % NA SOLN: 2.0000 | Freq: Two times a day (BID) | NASAL | 6 refills | Status: DC

## 2018-07-02 NOTE — Progress Notes (Signed)
Pre visit review using our clinic review tool, if applicable. No additional management support is needed unless otherwise documented below in the visit note. 

## 2018-07-02 NOTE — Patient Instructions (Signed)
Rest, fluids , tylenol  For cough:  Take Mucinex DM twice a day as needed until better  For nasal congestion: Use OTC   Flonase : 2 nasal sprays on each side of the nose in the morning until you feel better Use ASTELIN a prescribed spray : 2 nasal sprays on each side of the nose twice a day  until you feel better  Call if not gradually better over the next  10 days  Call anytime if the symptoms are severe

## 2018-07-02 NOTE — Progress Notes (Signed)
Subjective:    Patient ID: Andrea Briggs, female    DOB: Jun 01, 1976, 42 y.o.   MRN: [id number]  DOS:  07/02/2018 Type of visit - description : acute Was feeling well yesterday except for scratchy throat. Today she woke up feeling worse: Sore throat, cough, hoarseness.  Chills.  Nasal congestion.   Review of Systems No fevers per se. No nausea, vomiting, diarrhea. No headache or rash Hard to say if she has new aches and pains, she has chronic pain syndrome  Past Medical History:  Diagnosis Date  . Anxiety   . Chronic kidney disease (CKD)    follows w nephro  . Chronic pain   . DDD (degenerative disc disease), cervical   . DDD (degenerative disc disease), lumbar   . Depression   . Migraine   . MS (multiple sclerosis) ([place])   . Vertigo     Past Surgical History:  Procedure Laterality Date  . carpel tunnel    . HERNIA REPAIR      Social History   Socioeconomic History  . Marital status: Married    Spouse name: Not on file  . Number of children: Not on file  . Years of education: Not on file  . Highest education level: Not on file  Occupational History  . Not on file  Social Needs  . Financial resource strain: Not on file  . Food insecurity:    Worry: Not on file    Inability: Not on file  . Transportation needs:    Medical: Not on file    Non-medical: Not on file  Tobacco Use  . Smoking status: Never Smoker  . Smokeless tobacco: Never Used  Substance and Sexual Activity  . Alcohol use: No  . Drug use: No  . Sexual activity: Yes    Partners: Male    Birth control/protection: None  Lifestyle  . Physical activity:    Days per week: Not on file    Minutes per session: Not on file  . Stress: Not on file  Relationships  . Social connections:    Talks on phone: Not on file    Gets together: Not on file    Attends religious service: Not on file    Active member of club or organization: Not on file    Attends meetings of clubs or organizations: Not on  file    Relationship status: Not on file  . Intimate partner violence:    Fear of current or ex partner: Not on file    Emotionally abused: Not on file    Physically abused: Not on file    Forced sexual activity: Not on file  Other Topics Concern  . Not on file  Social History Narrative  . Not on file      Allergies as of 07/02/2018      Reactions   Nsaids Other (See Comments)   nephropathy   Ketorolac Other (See Comments)   Renal insufficiency   Metoprolol Swelling      Medication List       Accurate as of July 02, 2018  5:28 PM. Always use your most recent med list.        amitriptyline 50 MG tablet Commonly known as:  ELAVIL Take 1 tablet (50 mg total) by mouth at bedtime.   atorvastatin 40 MG tablet Commonly known as:  LIPITOR Take 1 tablet (40 mg total) by mouth daily.   azelastine 0.1 % nasal spray Commonly known as:  ASTELIN  Place 2 sprays into both nostrils 2 (two) times daily.   dicyclomine 10 MG capsule Commonly known as:  BENTYL Take 1 capsule (10 mg total) by mouth 4 (four) times daily -  before meals and at bedtime.   DULoxetine 60 MG capsule Commonly known as:  CYMBALTA Take 1 capsule (60 mg total) by mouth daily.   gabapentin 400 MG capsule Commonly known as:  NEURONTIN Take 400 mg by mouth at bedtime.   hydrOXYzine 25 MG tablet Commonly known as:  ATARAX/VISTARIL Take 25 mg by mouth 2 (two) times daily.   ibuprofen 800 MG tablet Commonly known as:  ADVIL,MOTRIN Take 800 mg by mouth every 8 (eight) hours as needed.   meclizine 25 MG tablet Commonly known as:  ANTIVERT Take 25 mg by mouth 3 (three) times daily as needed for dizziness.   medroxyPROGESTERone 150 MG/ML injection Commonly known as:  DEPO-PROVERA Inject 150 mg into the muscle every 3 (three) months.   multivitamin capsule Take 1 capsule by mouth daily.   tiZANidine 2 MG tablet Commonly known as:  ZANAFLEX Take by mouth 4 (four) times daily.   traZODone 50 MG  tablet Commonly known as:  DESYREL Take 50 mg by mouth at bedtime. 1-3 tablets   ZONEGRAN 100 MG capsule Generic drug:  zonisamide Take 300 mg by mouth daily.           Objective:   Physical Exam BP 126/80 (BP Location: Left Arm, Patient Position: Sitting, Cuff Size: Normal)   Pulse 91   Temp 98.1 F (36.7 C) (Oral)   Resp 16   Ht 5\' 6"  (1.676 m)   Wt 226 lb 6 oz (102.7 kg)   SpO2 97%   BMI 36.54 kg/m  General:   Well developed, NAD, BMI noted. HEENT:  Normocephalic . Face symmetric, atraumatic.  TMs are slightly bulged.  Throat symmetric, slightly red but no discharge.  Nose quite congested, sinuses no TTP.  Mild hoarseness. Lungs:  CTA B Normal respiratory effort, no intercostal retractions, no accessory muscle use. Heart: RRR,  no murmur.  No pretibial edema bilaterally  Skin: Not pale. Not jaundice Neurologic:  alert & oriented X3.  Speech normal, gait appropriate for age and unassisted Psych--  Cognition and judgment appear intact.  Cooperative with normal attention span and concentration.  Behavior appropriate. No anxious or depressed appearing.      Assessment & Plan:    42 year old female, PMH includes high cholesterol, on Depo-Provera, DJD, CKD.  Chronic pain syndrome, MS.  Presents with:  URI: Patient tested neg for strep & influenza. She has a URI, recommend Tylenol, Mucinex, rest, fluids, nasal sprays.  Call if not better.

## 2018-09-08 ENCOUNTER — Other Ambulatory Visit: Payer: Self-pay | Admitting: Family Medicine

## 2018-09-08 DIAGNOSIS — R109 Unspecified abdominal pain: Secondary | ICD-10-CM

## 2018-09-08 MED ORDER — AMITRIPTYLINE HCL 50 MG PO TABS: 50.0000 mg | ORAL_TABLET | Freq: Every day | ORAL | 2 refills | Status: DC

## 2018-09-20 DIAGNOSIS — G894 Chronic pain syndrome: Secondary | ICD-10-CM | POA: Diagnosis not present

## 2018-09-20 DIAGNOSIS — M5136 Other intervertebral disc degeneration, lumbar region: Secondary | ICD-10-CM | POA: Diagnosis not present

## 2018-09-20 DIAGNOSIS — G35 Multiple sclerosis: Secondary | ICD-10-CM | POA: Diagnosis not present

## 2018-09-20 DIAGNOSIS — M5030 Other cervical disc degeneration, unspecified cervical region: Secondary | ICD-10-CM | POA: Diagnosis not present

## 2018-09-20 DIAGNOSIS — G43011 Migraine without aura, intractable, with status migrainosus: Secondary | ICD-10-CM | POA: Diagnosis not present

## 2018-11-10 ENCOUNTER — Other Ambulatory Visit: Payer: Self-pay

## 2018-11-10 ENCOUNTER — Ambulatory Visit (INDEPENDENT_AMBULATORY_CARE_PROVIDER_SITE_OTHER): Payer: Medicare Other | Admitting: Family Medicine

## 2018-11-10 ENCOUNTER — Encounter: Payer: Self-pay | Admitting: Family Medicine

## 2018-11-10 DIAGNOSIS — N3090 Cystitis, unspecified without hematuria: Secondary | ICD-10-CM

## 2018-11-10 DIAGNOSIS — G5602 Carpal tunnel syndrome, left upper limb: Secondary | ICD-10-CM

## 2018-11-10 DIAGNOSIS — M797 Fibromyalgia: Secondary | ICD-10-CM

## 2018-11-10 MED ORDER — DULOXETINE HCL 60 MG PO CPEP: 60.0000 mg | ORAL_CAPSULE | Freq: Every day | ORAL | 2 refills | Status: DC

## 2018-11-10 MED ORDER — SULFAMETHOXAZOLE-TRIMETHOPRIM 800-160 MG PO TABS: 1.0000 | ORAL_TABLET | Freq: Two times a day (BID) | ORAL | 0 refills | Status: AC

## 2018-11-10 NOTE — Progress Notes (Signed)
Chief Complaint  Patient presents with  . Pain    Subjective: Patient is a 43 y.o. female here for med ck. Due to outbreak, we are interacting via web portal for an electronic face-to-face visit. I verified patient's ID using 2 identifiers.   Hx of FM. Walks daily. Well controlled on Elavil and Cymbalta.   Hx of L wrist pain/numbness tingling. Hx of CTS on R that was resolved w surgery. Has been wearing splint at night without relief. Not interested in steroid injections.  Thinks she has a urinary/kidney infection. Hx of MS, seems to get more often due to that. She is experiencing flank pain and urinary freq. This is the typical presentation for this. No bleeding, pain, N/V, incomplete emptying, fevers, discharge.   ROS: Const: No fevers GU: As noted in HPI  Past Medical History:  Diagnosis Date  . Anxiety   . Chronic kidney disease (CKD)    follows w nephro  . Chronic pain   . DDD (degenerative disc disease), cervical   . DDD (degenerative disc disease), lumbar   . Depression   . Migraine   . MS (multiple sclerosis) ([place])   . Vertigo     Objective: No conversational dyspnea Age appropriate judgment and insight Nml affect and mood  Assessment and Plan: Fibromyalgia - Plan: DULoxetine (CYMBALTA) 60 MG capsule  Cystitis - Plan: sulfamethoxazole-trimethoprim (BACTRIM DS) 800-160 MG tablet  Carpal tunnel syndrome of left wrist - Plan: Ambulatory referral to Hand Surgery  Cont SNRI. Counseled on exercise. 7 d of Bactrim to cover for possible kidney involvement. If no improvement, will need to ck urine and do a more indepth eval.  Refer to Hand at her request.  F/u in 6 mo for CPE.  The patient voiced understanding and agreement to the plan.  [place], DO 11/10/18  1:55 PM

## 2018-11-13 ENCOUNTER — Other Ambulatory Visit: Payer: Self-pay | Admitting: Family Medicine

## 2018-11-13 DIAGNOSIS — I700 Atherosclerosis of aorta: Secondary | ICD-10-CM

## 2018-11-24 DIAGNOSIS — G5602 Carpal tunnel syndrome, left upper limb: Secondary | ICD-10-CM | POA: Diagnosis not present

## 2018-11-24 DIAGNOSIS — G5622 Lesion of ulnar nerve, left upper limb: Secondary | ICD-10-CM | POA: Diagnosis not present

## 2018-11-29 ENCOUNTER — Telehealth: Payer: Self-pay | Admitting: Family Medicine

## 2018-11-29 NOTE — Telephone Encounter (Signed)
Pt dropped off documents to be filled out by provider (6 pages [place]) Pt would like document to be faxed when ready to [phone]. Document put at front office tray under providers names.

## 2018-12-03 DIAGNOSIS — M5030 Other cervical disc degeneration, unspecified cervical region: Secondary | ICD-10-CM | POA: Diagnosis not present

## 2018-12-03 DIAGNOSIS — G5622 Lesion of ulnar nerve, left upper limb: Secondary | ICD-10-CM | POA: Diagnosis not present

## 2018-12-03 DIAGNOSIS — G5602 Carpal tunnel syndrome, left upper limb: Secondary | ICD-10-CM | POA: Diagnosis not present

## 2018-12-06 DIAGNOSIS — M797 Fibromyalgia: Secondary | ICD-10-CM | POA: Diagnosis not present

## 2018-12-06 DIAGNOSIS — G5602 Carpal tunnel syndrome, left upper limb: Secondary | ICD-10-CM | POA: Diagnosis not present

## 2018-12-06 DIAGNOSIS — G35 Multiple sclerosis: Secondary | ICD-10-CM | POA: Diagnosis not present

## 2018-12-06 DIAGNOSIS — G894 Chronic pain syndrome: Secondary | ICD-10-CM | POA: Diagnosis not present

## 2018-12-06 DIAGNOSIS — G43011 Migraine without aura, intractable, with status migrainosus: Secondary | ICD-10-CM | POA: Diagnosis not present

## 2018-12-09 DIAGNOSIS — G5602 Carpal tunnel syndrome, left upper limb: Secondary | ICD-10-CM | POA: Diagnosis not present

## 2019-01-06 DIAGNOSIS — G5602 Carpal tunnel syndrome, left upper limb: Secondary | ICD-10-CM | POA: Diagnosis not present

## 2019-01-24 DIAGNOSIS — G5602 Carpal tunnel syndrome, left upper limb: Secondary | ICD-10-CM | POA: Diagnosis not present

## 2019-01-24 DIAGNOSIS — Z01812 Encounter for preprocedural laboratory examination: Secondary | ICD-10-CM | POA: Diagnosis not present

## 2019-01-24 DIAGNOSIS — Z1159 Encounter for screening for other viral diseases: Secondary | ICD-10-CM | POA: Diagnosis not present

## 2019-01-28 DIAGNOSIS — G5602 Carpal tunnel syndrome, left upper limb: Secondary | ICD-10-CM | POA: Diagnosis not present

## 2019-04-26 ENCOUNTER — Ambulatory Visit: Payer: Medicare Other

## 2019-05-02 DIAGNOSIS — E889 Metabolic disorder, unspecified: Secondary | ICD-10-CM | POA: Diagnosis not present

## 2019-05-02 DIAGNOSIS — N141 Nephropathy induced by other drugs, medicaments and biological substances: Secondary | ICD-10-CM | POA: Diagnosis not present

## 2019-05-02 DIAGNOSIS — E559 Vitamin D deficiency, unspecified: Secondary | ICD-10-CM | POA: Diagnosis not present

## 2019-05-02 DIAGNOSIS — R109 Unspecified abdominal pain: Secondary | ICD-10-CM | POA: Diagnosis not present

## 2019-05-02 DIAGNOSIS — N182 Chronic kidney disease, stage 2 (mild): Secondary | ICD-10-CM | POA: Diagnosis not present

## 2019-05-02 DIAGNOSIS — R3129 Other microscopic hematuria: Secondary | ICD-10-CM | POA: Diagnosis not present

## 2019-05-06 DIAGNOSIS — G5602 Carpal tunnel syndrome, left upper limb: Secondary | ICD-10-CM | POA: Diagnosis not present

## 2019-05-19 ENCOUNTER — Ambulatory Visit: Payer: Medicare Other

## 2019-05-19 DIAGNOSIS — M797 Fibromyalgia: Secondary | ICD-10-CM | POA: Diagnosis not present

## 2019-05-19 DIAGNOSIS — G5602 Carpal tunnel syndrome, left upper limb: Secondary | ICD-10-CM | POA: Diagnosis not present

## 2019-05-19 DIAGNOSIS — M792 Neuralgia and neuritis, unspecified: Secondary | ICD-10-CM | POA: Diagnosis not present

## 2019-06-13 DIAGNOSIS — M797 Fibromyalgia: Secondary | ICD-10-CM | POA: Diagnosis not present

## 2019-06-13 DIAGNOSIS — G894 Chronic pain syndrome: Secondary | ICD-10-CM | POA: Diagnosis not present

## 2019-06-13 DIAGNOSIS — G35 Multiple sclerosis: Secondary | ICD-10-CM | POA: Diagnosis not present

## 2019-06-13 DIAGNOSIS — G43011 Migraine without aura, intractable, with status migrainosus: Secondary | ICD-10-CM | POA: Diagnosis not present

## 2019-06-13 DIAGNOSIS — M791 Myalgia, unspecified site: Secondary | ICD-10-CM | POA: Diagnosis not present

## 2019-07-01 DIAGNOSIS — G5602 Carpal tunnel syndrome, left upper limb: Secondary | ICD-10-CM | POA: Diagnosis not present

## 2019-07-04 DIAGNOSIS — G5602 Carpal tunnel syndrome, left upper limb: Secondary | ICD-10-CM | POA: Diagnosis not present

## 2019-07-04 DIAGNOSIS — R9082 White matter disease, unspecified: Secondary | ICD-10-CM | POA: Diagnosis not present

## 2019-07-04 DIAGNOSIS — M791 Myalgia, unspecified site: Secondary | ICD-10-CM | POA: Diagnosis not present

## 2019-07-04 DIAGNOSIS — G894 Chronic pain syndrome: Secondary | ICD-10-CM | POA: Diagnosis not present

## 2019-07-04 DIAGNOSIS — G35 Multiple sclerosis: Secondary | ICD-10-CM | POA: Diagnosis not present

## 2019-07-11 DIAGNOSIS — G5602 Carpal tunnel syndrome, left upper limb: Secondary | ICD-10-CM | POA: Diagnosis not present

## 2019-07-21 ENCOUNTER — Telehealth: Payer: Self-pay | Admitting: Family Medicine

## 2019-07-21 NOTE — Telephone Encounter (Signed)
Deana from [facility] calling stating recently did lab work on [facility] pt and discovered abnormalities in [place] and has on 12/31 pm faxed over these results.

## 2019-07-25 NOTE — Telephone Encounter (Signed)
Have you seen these yet?

## 2019-07-25 NOTE — Telephone Encounter (Signed)
I have not been in office since 12/30. She is due for her CPE w me so depending on what it shows, we might be able to get some follow up testing. Ty.

## 2019-07-25 NOTE — Telephone Encounter (Signed)
Called the patient left message to call back 

## 2019-07-26 NOTE — Telephone Encounter (Signed)
She scheduled CPE on 08/12/19

## 2019-08-12 ENCOUNTER — Other Ambulatory Visit (INDEPENDENT_AMBULATORY_CARE_PROVIDER_SITE_OTHER): Payer: Medicare Other

## 2019-08-12 ENCOUNTER — Ambulatory Visit (INDEPENDENT_AMBULATORY_CARE_PROVIDER_SITE_OTHER): Payer: Medicare Other | Admitting: Family Medicine

## 2019-08-12 ENCOUNTER — Encounter: Payer: Self-pay | Admitting: Family Medicine

## 2019-08-12 ENCOUNTER — Other Ambulatory Visit: Payer: Self-pay

## 2019-08-12 VITALS — BP 110/80 | HR 85 | Temp 98.1°F | Ht 66.0 in | Wt 210.0 lb

## 2019-08-12 DIAGNOSIS — Z0000 Encounter for general adult medical examination without abnormal findings: Secondary | ICD-10-CM | POA: Diagnosis not present

## 2019-08-12 DIAGNOSIS — D649 Anemia, unspecified: Secondary | ICD-10-CM | POA: Diagnosis not present

## 2019-08-12 DIAGNOSIS — Z23 Encounter for immunization: Secondary | ICD-10-CM | POA: Diagnosis not present

## 2019-08-12 DIAGNOSIS — I700 Atherosclerosis of aorta: Secondary | ICD-10-CM

## 2019-08-12 DIAGNOSIS — G35 Multiple sclerosis: Secondary | ICD-10-CM

## 2019-08-12 DIAGNOSIS — Z114 Encounter for screening for human immunodeficiency virus [HIV]: Secondary | ICD-10-CM | POA: Diagnosis not present

## 2019-08-12 DIAGNOSIS — M797 Fibromyalgia: Secondary | ICD-10-CM | POA: Diagnosis not present

## 2019-08-12 LAB — COMPREHENSIVE METABOLIC PANEL
ALT: 11 U/L (ref 0–35)
AST: 13 U/L (ref 0–37)
Albumin: 4.1 g/dL (ref 3.5–5.2)
Alkaline Phosphatase: 60 U/L (ref 39–117)
BUN: 16 mg/dL (ref 6–23)
CO2: 22 mEq/L (ref 19–32)
Calcium: 8.8 mg/dL (ref 8.4–10.5)
Chloride: 108 mEq/L (ref 96–112)
Creatinine, Ser: 1.1 mg/dL (ref 0.40–1.20)
GFR: 65.12 mL/min (ref >60.00)
Glucose, Bld: 93 mg/dL (ref 70–99)
Potassium: 3.7 mEq/L (ref 3.5–5.1)
Sodium: 140 mEq/L (ref 135–145)
Total Bilirubin: 0.7 mg/dL (ref 0.2–1.2)
Total Protein: 6.2 g/dL (ref 6.0–8.3)

## 2019-08-12 LAB — IBC + FERRITIN
Ferritin: 6.1 ng/mL — ABNORMAL LOW (ref 10.0–291.0)
Iron: 22 ug/dL — ABNORMAL LOW (ref 42–145)
Saturation Ratios: 6.7 % — ABNORMAL LOW (ref 20.0–50.0)
Transferrin: 235 mg/dL (ref 212.0–360.0)

## 2019-08-12 LAB — LIPID PANEL
Cholesterol: 144 mg/dL (ref 0–200)
HDL: 35.4 mg/dL — ABNORMAL LOW (ref >39.00)
LDL Cholesterol: 95 mg/dL (ref 0–99)
NonHDL: 108.12
Total CHOL/HDL Ratio: 4
Triglycerides: 68 mg/dL (ref 0.0–149.0)
VLDL: 13.6 mg/dL (ref 0.0–40.0)

## 2019-08-12 LAB — CBC
HCT: 36.8 % (ref 36.0–46.0)
Hemoglobin: 11.8 g/dL — ABNORMAL LOW (ref 12.0–15.0)
MCHC: 32.2 g/dL (ref 30.0–36.0)
MCV: 75.4 fl — ABNORMAL LOW (ref 78.0–100.0)
Platelets: 229 10*3/uL (ref 150.0–400.0)
RBC: 4.87 Mil/uL (ref 3.87–5.11)
RDW: 15.8 % — ABNORMAL HIGH (ref 11.5–15.5)
WBC: 4.7 10*3/uL (ref 4.0–10.5)

## 2019-08-12 LAB — HIV ANTIBODY (ROUTINE TESTING W REFLEX): HIV 1&2 Ab, 4th Generation: NONREACTIVE

## 2019-08-12 MED ORDER — PREGABALIN 75 MG PO CAPS: 75.0000 mg | ORAL_CAPSULE | Freq: Two times a day (BID) | ORAL | 1 refills | Status: DC

## 2019-08-12 NOTE — Patient Instructions (Addendum)
Call Dr. Gerarda Fraction to see if the ulnar nerve is contributing to your left arm issue.  Keep the diet clean and stay active.  Sheliah Mends MD: [facility]  [address]  [place],  [id number]  [phone]   Give [place] 2-3 business days to get the results of your labs back.   Let us know if you need anything.

## 2019-08-12 NOTE — Progress Notes (Signed)
CC: Well visit   Well Woman Andrea Briggs is here for a complete physical.   Her last physical was >1 year ago.  Current diet: in general, a "healthy" diet. Current exercise: none. Weight is decreasing and she denies daytime fatigue. No LMP recorded. Patient has had an injection. Seatbelt? Yes  Health Maintenance Pap/HPV- Yes 09/03/2014 Mammogram- scheduled 2/16 Tetanus- No HIV screening- Yes   Patient has a history of chronic pain and fibromyalgia.  She is currently on Cymbalta 60 mg daily.  This was increased from 30 mg daily by a pain management specialist.  No other changes were made at this time.  Neurontin has not been helpful in the past.  She is never been on pregabalin/Lyrica.  She is not exercising currently.  She did not have a follow-up appointment with the pain management specialist at this time.  She does continue to have left upper extremity weakness and tingling despite carpal tunnel release in July 2020.  She has a follow-up appointment with a hand specialist.  Past Medical History:  Diagnosis Date  . Anxiety   . Chronic kidney disease (CKD)    follows w nephro  . Chronic pain   . DDD (degenerative disc disease), cervical   . DDD (degenerative disc disease), lumbar   . Depression   . Migraine   . MS (multiple sclerosis) ([employer])   . Vertigo      Past Surgical History:  Procedure Laterality Date  . carpel tunnel    . HERNIA REPAIR      Medications  Current Outpatient Medications on File Prior to Visit  Medication Sig Dispense Refill  . amitriptyline (ELAVIL) 50 MG tablet Take 1 tablet (50 mg total) by mouth at bedtime. 30 tablet 2  . atorvastatin (LIPITOR) 40 MG tablet TAKE 1 TABLET BY MOUTH DAILY 90 tablet 3  . DULoxetine (CYMBALTA) 60 MG capsule Take 1 capsule (60 mg total) by mouth daily. 90 capsule 2  . hydrOXYzine (ATARAX/VISTARIL) 25 MG tablet Take 25 mg by mouth 2 (two) times daily.    Marland Kitchen ibuprofen (ADVIL,MOTRIN) 800 MG tablet Take 800 mg by mouth  every 8 (eight) hours as needed.    . meclizine (ANTIVERT) 25 MG tablet Take 25 mg by mouth 3 (three) times daily as needed for dizziness.    . Multiple Vitamin (MULTIVITAMIN) capsule Take 1 capsule by mouth daily.    Marland Kitchen tiZANidine (ZANAFLEX) 2 MG tablet Take by mouth 4 (four) times daily.    Marland Kitchen zonisamide (ZONEGRAN) 100 MG capsule Take 300 mg by mouth daily.     Allergies Allergies  Allergen Reactions  . Nsaids Other (See Comments)    nephropathy  . Ketorolac Other (See Comments)    Renal insufficiency  . Metoprolol Swelling    Review of Systems: Constitutional:  no unexpected weight changes Eye:  no recent significant change in vision Ear/Nose/Mouth/Throat:  Ears:  no recent change in hearing Nose/Mouth/Throat:  no complaints of nasal congestion, no sore throat Cardiovascular: no chest pain Respiratory:  no shortness of breath Gastrointestinal:  no abdominal pain, no change in bowel habits GU:  Female: negative for dysuria or pelvic pain Musculoskeletal/Extremities:  +diffuse and chronic pain Integumentary (Skin/Breast):  no abnormal skin lesions reported Neurologic:  no headaches Endocrine:  denies fatigue Hematologic/Lymphatic:  No areas of easy bleeding  Exam BP 110/80 (BP Location: Left Arm, Patient Position: Sitting, Cuff Size: Normal)   Pulse 85   Temp 98.1 F (36.7 C) (Temporal)   Ht  5\' 6"  (1.676 m)   Wt 210 lb (95.3 kg)   SpO2 98%   BMI 33.89 kg/m  General:  well developed, well nourished, in no apparent distress Skin:  no significant moles, warts, or growths Head:  no masses, lesions, or tenderness Eyes:  pupils equal and round, sclera anicteric without injection Ears:  canals without lesions, TMs shiny without retraction, no obvious effusion, no erythema Nose:  nares patent, septum midline, mucosa normal, and no drainage or sinus tenderness Throat/Pharynx:  lips and gingiva without lesion; tongue and uvula midline; non-inflamed pharynx; no exudates or  postnasal drainage Neck: neck supple without adenopathy, thyromegaly, or masses Lungs:  clear to auscultation, breath sounds equal bilaterally, no respiratory distress Cardio:  regular rate and rhythm, no LE edema Abdomen:  abdomen soft, nontender; bowel sounds normal; no masses or organomegaly Genital: Defer to GYN Musculoskeletal:  symmetrical muscle groups noted without atrophy or deformity Extremities:  no clubbing, cyanosis, or edema, no deformities, no skin discoloration Neuro:  gait normal; deep tendon reflexes normal and symmetric; 4/5 grip strength, 5/5 strength throughout otherwise Psych: well oriented with normal range of affect and appropriate judgment/insight  Assessment and Plan  Well adult exam - Plan: CBC, Comprehensive metabolic panel, Lipid panel  MS (multiple sclerosis) ([place])  Aortic atherosclerosis ([place])  Encounter for screening for HIV - Plan: HIV Antibody (routine testing w rflx)  Fibromyalgia - Plan: pregabalin (LYRICA) 75 MG capsule  Need for Tdap vaccination - Plan: Tdap vaccine greater than or equal to 7yo IM  Need for influenza vaccination - Plan: Flu Vaccine QUAD 6+ mos PF IM (Fluarix Quad PF)   Well 44 y.o. female. Counseled on diet and exercise. Cont Cymbalta. Add Lyrica. Revisit TCA? Other orders as above. Follow up in 1 mo w me if she does not get in w pain management. The patient voiced understanding and agreement to the plan.  [place], DO 08/12/19 12:20 PM

## 2019-08-15 ENCOUNTER — Other Ambulatory Visit: Payer: Self-pay | Admitting: Family Medicine

## 2019-08-15 DIAGNOSIS — E611 Iron deficiency: Secondary | ICD-10-CM

## 2019-08-15 DIAGNOSIS — R319 Hematuria, unspecified: Secondary | ICD-10-CM

## 2019-08-25 ENCOUNTER — Other Ambulatory Visit (INDEPENDENT_AMBULATORY_CARE_PROVIDER_SITE_OTHER): Payer: Medicare Other

## 2019-08-25 ENCOUNTER — Other Ambulatory Visit: Payer: Self-pay | Admitting: Family Medicine

## 2019-08-25 ENCOUNTER — Telehealth: Payer: Self-pay | Admitting: *Deleted

## 2019-08-25 DIAGNOSIS — E611 Iron deficiency: Secondary | ICD-10-CM | POA: Diagnosis not present

## 2019-08-25 DIAGNOSIS — R195 Other fecal abnormalities: Secondary | ICD-10-CM

## 2019-08-25 DIAGNOSIS — D509 Iron deficiency anemia, unspecified: Secondary | ICD-10-CM

## 2019-08-25 LAB — FECAL OCCULT BLOOD, IMMUNOCHEMICAL: Fecal Occult Bld: POSITIVE — AB

## 2019-08-25 NOTE — Telephone Encounter (Signed)
Received call from [facility] lab stating pt has positive IFOB result.

## 2019-08-26 ENCOUNTER — Encounter: Payer: Self-pay | Admitting: Gastroenterology

## 2019-08-29 ENCOUNTER — Ambulatory Visit: Payer: Medicare Other | Admitting: Gastroenterology

## 2019-08-29 NOTE — Telephone Encounter (Signed)
Pt has already been sent to GI. Ty.

## 2019-09-02 ENCOUNTER — Encounter: Payer: Self-pay | Admitting: Family Medicine

## 2019-09-12 ENCOUNTER — Telehealth: Payer: Self-pay | Admitting: Family Medicine

## 2019-09-12 MED ORDER — AMITRIPTYLINE HCL 50 MG PO TABS: 50.0000 mg | ORAL_TABLET | Freq: Every day | ORAL | 0 refills | Status: DC

## 2019-09-12 NOTE — Telephone Encounter (Signed)
I don't see a message?

## 2019-09-12 NOTE — Telephone Encounter (Signed)
Refill done.  

## 2019-09-14 ENCOUNTER — Telehealth: Payer: Self-pay | Admitting: Family Medicine

## 2019-09-14 DIAGNOSIS — Z1231 Encounter for screening mammogram for malignant neoplasm of breast: Secondary | ICD-10-CM | POA: Diagnosis not present

## 2019-09-14 NOTE — Telephone Encounter (Signed)
Pt dropped off document to be filled out ([place] 7 pages- [employer]) Pt would like document to be faxed when ready [phone]. Document put at front office tray under providers name.

## 2019-09-14 NOTE — Telephone Encounter (Signed)
Will put in PCP's folder once it arrives and let PCP know.

## 2019-09-20 NOTE — Telephone Encounter (Signed)
From received from PCP - Placed on [employer] with PCP note.

## 2019-09-21 ENCOUNTER — Other Ambulatory Visit: Payer: Self-pay

## 2019-09-21 ENCOUNTER — Encounter: Payer: Self-pay | Admitting: Family Medicine

## 2019-09-21 ENCOUNTER — Ambulatory Visit (INDEPENDENT_AMBULATORY_CARE_PROVIDER_SITE_OTHER): Payer: Medicare Other | Admitting: Family Medicine

## 2019-09-21 VITALS — Temp 97.1°F

## 2019-09-21 DIAGNOSIS — G894 Chronic pain syndrome: Secondary | ICD-10-CM | POA: Diagnosis not present

## 2019-09-21 DIAGNOSIS — M5136 Other intervertebral disc degeneration, lumbar region: Secondary | ICD-10-CM

## 2019-09-21 DIAGNOSIS — M797 Fibromyalgia: Secondary | ICD-10-CM

## 2019-09-21 NOTE — Telephone Encounter (Signed)
PCP completed form//faxed//patient informed faxed and done//labeled and sent to scan.

## 2019-09-21 NOTE — Telephone Encounter (Signed)
Received fax confirmation

## 2019-09-21 NOTE — Progress Notes (Signed)
Chief Complaint  Patient presents with  . Follow-up    medication    Subjective: Patient is a 44 y.o. female here for f/u. Due to COVID-19 pandemic, we are interacting via web portal for an electronic face-to-face visit. I verified patient's ID using 2 identifiers. Patient agreed to proceed with visit via this method. Patient is at home, I am at office. Patient and I are present for visit.   Patient was seen around 6 weeks ago and started on Lyrica for chronic pain/fibromyalgia. She is currently taking Cymbalta 60 mg daily and reports compliance. She reports modest improvement with the Lyrica but only around 30%. Reports it made her lightheaded. She has failed amitriptyline in the past. She is interested in seeing a specialist.   Past Medical History:  Diagnosis Date  . Anxiety   . Chronic kidney disease (CKD)    follows w nephro  . Chronic pain   . DDD (degenerative disc disease), cervical   . DDD (degenerative disc disease), lumbar   . Depression   . Migraine   . MS (multiple sclerosis) ([place])   . Vertigo     Objective: Temp (!) 97.1 F (36.2 C) (Temporal)  No conversational dyspnea Age appropriate judgment and insight Nml affect and mood  Assessment and Plan: DDD (degenerative disc disease), lumbar - Plan: Ambulatory referral to Physical Medicine Rehab  Chronic pain syndrome - Plan: Ambulatory referral to Physical Medicine Rehab  Fibromyalgia  Continue Cymbalta 60 mg daily. Stop Lyrica. refer to PM&R for their help. The patient voiced understanding and agreement to the plan.  [place], DO 09/21/19  10:04 AM

## 2019-09-26 ENCOUNTER — Other Ambulatory Visit: Payer: Self-pay

## 2019-10-03 ENCOUNTER — Ambulatory Visit: Payer: Medicare Other

## 2019-10-10 ENCOUNTER — Encounter: Payer: Self-pay | Admitting: Physical Therapy

## 2019-10-10 ENCOUNTER — Telehealth: Payer: Self-pay | Admitting: Family Medicine

## 2019-10-10 ENCOUNTER — Other Ambulatory Visit: Payer: Self-pay

## 2019-10-10 ENCOUNTER — Ambulatory Visit: Payer: Medicare Other | Admitting: Physical Therapy

## 2019-10-10 DIAGNOSIS — M5136 Other intervertebral disc degeneration, lumbar region: Secondary | ICD-10-CM

## 2019-10-10 NOTE — Telephone Encounter (Signed)
Referral done and called the patient to inform

## 2019-10-10 NOTE — Telephone Encounter (Signed)
Pt came in office stating from her last visit (09-21-2019 as virtual) that she did not want to do physical therapy, pt instead wants to see an Ortho for her issue, (ortho in [place] if possible) Pt tel [phone]. Please add referral for Ortho. Please advise.

## 2019-10-10 NOTE — Therapy (Signed)
[facility] [address]  [place], [place], [address] Phone: [phone]   Fax:  [phone]  Patient Details  Name: Andrea Briggs MRN: [id number] Date of Birth: 30-May-1976 Referring Provider:  Shelda Pal*  Encounter Date: 10/10/2019   Patient arrived to session for evaluation, reporting confusion as to why she is being seen by Physical Therapy. Upon review of referring MD's notes, patient was referred to Physical Medicine & Rehab and patient reporting that she does not want to be seen by PT. Did not proceed with today's appointment; left without being seen.    Janene Harvey, PT, DPT 10/10/19 9:46 AM   [facility] [address]  [place], [place], [address] Phone: [phone]   Fax:  [phone]

## 2019-10-10 NOTE — Addendum Note (Signed)
Addended by: Sharon Seller B on: 10/10/2019 11:36 AM   Modules accepted: Orders

## 2019-10-10 NOTE — Telephone Encounter (Signed)
OK to refer to ortho spine for DDD. Ty.

## 2019-11-08 ENCOUNTER — Ambulatory Visit: Payer: Medicare Other | Admitting: Gastroenterology

## 2019-11-09 DIAGNOSIS — R202 Paresthesia of skin: Secondary | ICD-10-CM | POA: Diagnosis not present

## 2019-11-09 DIAGNOSIS — G5602 Carpal tunnel syndrome, left upper limb: Secondary | ICD-10-CM | POA: Diagnosis not present

## 2019-11-09 DIAGNOSIS — R200 Anesthesia of skin: Secondary | ICD-10-CM | POA: Diagnosis not present

## 2019-11-09 DIAGNOSIS — M5030 Other cervical disc degeneration, unspecified cervical region: Secondary | ICD-10-CM | POA: Diagnosis not present

## 2019-12-06 DIAGNOSIS — M47813 Spondylosis without myelopathy or radiculopathy, cervicothoracic region: Secondary | ICD-10-CM | POA: Diagnosis not present

## 2019-12-06 DIAGNOSIS — M50223 Other cervical disc displacement at C6-C7 level: Secondary | ICD-10-CM | POA: Diagnosis not present

## 2019-12-06 DIAGNOSIS — M47812 Spondylosis without myelopathy or radiculopathy, cervical region: Secondary | ICD-10-CM | POA: Diagnosis not present

## 2019-12-06 DIAGNOSIS — G5602 Carpal tunnel syndrome, left upper limb: Secondary | ICD-10-CM | POA: Diagnosis not present

## 2019-12-06 DIAGNOSIS — M4802 Spinal stenosis, cervical region: Secondary | ICD-10-CM | POA: Diagnosis not present

## 2019-12-06 DIAGNOSIS — M5031 Other cervical disc degeneration,  high cervical region: Secondary | ICD-10-CM | POA: Diagnosis not present

## 2019-12-07 DIAGNOSIS — G35 Multiple sclerosis: Secondary | ICD-10-CM | POA: Diagnosis not present

## 2020-01-11 DIAGNOSIS — M5412 Radiculopathy, cervical region: Secondary | ICD-10-CM | POA: Diagnosis not present

## 2020-01-11 DIAGNOSIS — G5602 Carpal tunnel syndrome, left upper limb: Secondary | ICD-10-CM | POA: Diagnosis not present

## 2020-01-12 ENCOUNTER — Encounter: Payer: Self-pay | Admitting: Internal Medicine

## 2020-01-12 ENCOUNTER — Ambulatory Visit (INDEPENDENT_AMBULATORY_CARE_PROVIDER_SITE_OTHER): Payer: Medicare Other | Admitting: Internal Medicine

## 2020-01-12 VITALS — BP 116/82 | HR 70 | Temp 96.5°F | Resp 18 | Ht 66.0 in | Wt 208.2 lb

## 2020-01-12 DIAGNOSIS — R319 Hematuria, unspecified: Secondary | ICD-10-CM

## 2020-01-12 DIAGNOSIS — D649 Anemia, unspecified: Secondary | ICD-10-CM

## 2020-01-12 DIAGNOSIS — R109 Unspecified abdominal pain: Secondary | ICD-10-CM

## 2020-01-12 LAB — CBC WITH DIFFERENTIAL/PLATELET
Basophils Absolute: 0 10*3/uL (ref 0.0–0.1)
Basophils Relative: 0.6 % (ref 0.0–3.0)
Eosinophils Absolute: 0.1 10*3/uL (ref 0.0–0.7)
Eosinophils Relative: 1.4 % (ref 0.0–5.0)
HCT: 30.2 % — ABNORMAL LOW (ref 36.0–46.0)
Hemoglobin: 9.4 g/dL — ABNORMAL LOW (ref 12.0–15.0)
Lymphocytes Relative: 33.3 % (ref 12.0–46.0)
Lymphs Abs: 1.8 10*3/uL (ref 0.7–4.0)
MCHC: 31.1 g/dL (ref 30.0–36.0)
MCV: 68.3 fl — ABNORMAL LOW (ref 78.0–100.0)
Monocytes Absolute: 0.4 10*3/uL (ref 0.1–1.0)
Monocytes Relative: 7.7 % (ref 3.0–12.0)
Neutro Abs: 3.1 10*3/uL (ref 1.4–7.7)
Neutrophils Relative %: 57 % (ref 43.0–77.0)
Platelets: 334 10*3/uL (ref 150.0–400.0)
RBC: 4.42 Mil/uL (ref 3.87–5.11)
RDW: 17.2 % — ABNORMAL HIGH (ref 11.5–15.5)
WBC: 5.5 10*3/uL (ref 4.0–10.5)

## 2020-01-12 LAB — BASIC METABOLIC PANEL
BUN: 18 mg/dL (ref 6–23)
CO2: 25 mEq/L (ref 19–32)
Calcium: 8.8 mg/dL (ref 8.4–10.5)
Chloride: 106 mEq/L (ref 96–112)
Creatinine, Ser: 1.07 mg/dL (ref 0.40–1.20)
GFR: 67.11 mL/min (ref >60.00)
Glucose, Bld: 95 mg/dL (ref 70–99)
Potassium: 4.1 mEq/L (ref 3.5–5.1)
Sodium: 137 mEq/L (ref 135–145)

## 2020-01-12 LAB — URINALYSIS, ROUTINE W REFLEX MICROSCOPIC
Bilirubin Urine: NEGATIVE
Ketones, ur: NEGATIVE
Leukocytes,Ua: NEGATIVE
Nitrite: NEGATIVE
Specific Gravity, Urine: 1.02 (ref 1.000–1.030)
Total Protein, Urine: NEGATIVE
Urine Glucose: NEGATIVE
Urobilinogen, UA: 0.2 (ref 0.0–1.0)
WBC, UA: NONE SEEN (ref 0–?)
pH: 6.5 (ref 5.0–8.0)

## 2020-01-12 LAB — POC URINALSYSI DIPSTICK (AUTOMATED)
Bilirubin, UA: NEGATIVE
Glucose, UA: NEGATIVE
Ketones, UA: NEGATIVE
Leukocytes, UA: NEGATIVE
Nitrite, UA: NEGATIVE
Protein, UA: POSITIVE — AB
Spec Grav, UA: 1.025 (ref 1.010–1.025)
Urobilinogen, UA: 1 E.U./dL
pH, UA: 6 (ref 5.0–8.0)

## 2020-01-12 LAB — POCT URINE PREGNANCY: Preg Test, Ur: NEGATIVE

## 2020-01-12 MED ORDER — AMOXICILLIN-POT CLAVULANATE 875-125 MG PO TABS
1.0000 | ORAL_TABLET | Freq: Two times a day (BID) | ORAL | 0 refills | Status: DC
Start: 2020-01-12 — End: 2020-03-16

## 2020-01-12 NOTE — Progress Notes (Signed)
Pre visit review using our clinic review tool, if applicable. No additional management support is needed unless otherwise documented below in the visit note. 

## 2020-01-12 NOTE — Progress Notes (Signed)
Subjective:    Patient ID: Andrea Briggs, female    DOB: 1975/10/22, 44 y.o.   MRN: [id number]  DOS:  01/12/2020 Type of visit - description: Acute Symptoms started a week ago with pain located at the left abdomen and left flank. Pain does not radiate, has not changed sides, is a steady, somewhat worse with torso movement. She thinks he might be a UTI.  Denies fever chills Appetite is okay No rash No nausea, vomiting, diarrhea. No vaginal discharge or bleeding No dysuria, gross hematuria or difficulty urinating Had a normal menstrual period ended a day ago.  Review of Systems See above   Past Medical History:  Diagnosis Date  . Anxiety   . Chronic kidney disease (CKD)    follows w nephro  . Chronic pain   . DDD (degenerative disc disease), cervical   . DDD (degenerative disc disease), lumbar   . Depression   . Migraine   . MS (multiple sclerosis) ([place])   . Vertigo     Past Surgical History:  Procedure Laterality Date  . carpel tunnel    . HERNIA REPAIR      Allergies as of 01/12/2020      Reactions   Nsaids Other (See Comments)   nephropathy   Ketorolac Other (See Comments)   Renal insufficiency   Metoprolol Swelling      Medication List       Accurate as of January 12, 2020  8:29 AM. If you have any questions, ask your nurse or doctor.        atorvastatin 40 MG tablet Commonly known as: LIPITOR TAKE 1 TABLET BY MOUTH DAILY   DULoxetine 60 MG capsule Commonly known as: CYMBALTA Take 1 capsule (60 mg total) by mouth daily.   hydrOXYzine 25 MG tablet Commonly known as: ATARAX/VISTARIL Take 25 mg by mouth 2 (two) times daily.   ibuprofen 800 MG tablet Commonly known as: ADVIL Take 800 mg by mouth every 8 (eight) hours as needed.   multivitamin capsule Take 1 capsule by mouth daily.   tiZANidine 2 MG tablet Commonly known as: ZANAFLEX Take by mouth 4 (four) times daily.   Zonegran 100 MG capsule Generic drug: zonisamide Take 300 mg by  mouth daily.          Objective:   Physical Exam Skin:        BP 116/82 (BP Location: Left Arm, Patient Position: Sitting, Cuff Size: Normal)   Pulse 70   Temp (!) 96.5 F (35.8 C) (Temporal)   Resp 18   Ht 5\' 6"  (1.676 m)   Wt 208 lb 4 oz (94.5 kg)   SpO2 100%   BMI 33.61 kg/m  General:   Well developed, NAD, BMI noted.  HEENT:  Normocephalic . Face symmetric, atraumatic Lungs:  CTA B Normal respiratory effort, no intercostal retractions, no accessory muscle use. Heart: RRR,  no murmur.  Abdomen:  See graphic Skin: Not pale. Not jaundice Lower extremities: no pretibial edema bilaterally  Neurologic:  alert & oriented X3.  Speech normal, gait appropriate for age and unassisted Psych--  Cognition and judgment appear intact.  Cooperative with normal attention span and concentration.  Behavior appropriate. No anxious or depressed appearing.     Assessment      44 year old female, PMH includes high cholesterol,  DJD,  CKD, Chronic pain syndrome, MS, not on BCP, LMP started 6/20 to 6/23, h/o urolithiasis .  Presents with:  Left abdomen, left flank pain: As described  above, started a week ago, no clear-cut UTI or GI symptoms, see ROS. Udip +  RBCs (just finished her meses),  UPT negative. DDx: UTI, kidney stone, early shingles, MSK, diverticulitis less likely, others. Plan: CBC, BMP, UA, urine culture, renal ultrasound, empiric Augmentin to cover both UTI and possibly diverticulitis. Prompt reevaluation if symptoms not responding. Consider office visit again in 3 to 4 days to reassess clinically depending on work-up.  This visit occurred during the SARS-CoV-2 public health emergency.  Safety protocols were in place, including screening questions prior to the visit, additional usage of staff PPE, and extensive cleaning of exam room while observing appropriate contact time as indicated for disinfecting solutions.

## 2020-01-12 NOTE — Patient Instructions (Addendum)
Drink plenty fluids  Tylenol  500 mg OTC 2 tabs a day every 8 hours as needed for pain  Start an antibiotic called Augmentin  Call anytime if fever, chills, increased pain, rash, nausea, vomiting, blood in the stools or in the urine.  GO TO THE LAB : Get the blood work     STOP BY THE FIRST FLOOR: Schedule ultrasound of your kidneys

## 2020-01-13 LAB — URINE CULTURE
MICRO NUMBER:: 10630066
SPECIMEN QUALITY:: ADEQUATE

## 2020-01-16 ENCOUNTER — Telehealth: Payer: Self-pay | Admitting: Internal Medicine

## 2020-01-16 DIAGNOSIS — D649 Anemia, unspecified: Secondary | ICD-10-CM

## 2020-01-16 DIAGNOSIS — R109 Unspecified abdominal pain: Secondary | ICD-10-CM

## 2020-01-16 NOTE — Telephone Encounter (Signed)
GI referral sent

## 2020-01-16 NOTE — Telephone Encounter (Signed)
Spoke with the patient, she was seen with left abdominal pain 01/12/2020, was put on antibiotics empirically, urine culture eventually was negative. Work-up show anemia. Today she tells me she feels about the same, continue with abdominal pain, some constipation, no vomiting.  No blood in the stools. Please arrange a GI referral DX abdominal pain, anemia. She knows to call me if her symptoms get much worse.

## 2020-01-18 ENCOUNTER — Encounter: Payer: Self-pay | Admitting: Gastroenterology

## 2020-01-20 ENCOUNTER — Telehealth: Payer: Self-pay | Admitting: Family Medicine

## 2020-01-20 NOTE — Telephone Encounter (Signed)
Recommend to proceed with ultrasound and GI referral

## 2020-01-20 NOTE — Telephone Encounter (Signed)
Patient called regarding ultra sound for her stomach . She wants to know , if she still needs it , Patient saw Dr. Larose Kells . Please advise patient appt is for Tuesday .

## 2020-01-20 NOTE — Telephone Encounter (Signed)
Please advise 

## 2020-01-20 NOTE — Telephone Encounter (Signed)
Spoke w/ Pt- informed of recommendations. Pt verbalized understanding.  

## 2020-01-24 ENCOUNTER — Other Ambulatory Visit: Payer: Self-pay

## 2020-01-24 ENCOUNTER — Ambulatory Visit (HOSPITAL_BASED_OUTPATIENT_CLINIC_OR_DEPARTMENT_OTHER)
Admission: RE | Admit: 2020-01-24 | Discharge: 2020-01-24 | Disposition: A | Discharge status: Home / Self Care | Payer: Medicare HMO | Source: Ambulatory Visit | Attending: Internal Medicine | Admitting: Internal Medicine

## 2020-01-24 DIAGNOSIS — R319 Hematuria, unspecified: Secondary | ICD-10-CM | POA: Diagnosis not present

## 2020-01-27 ENCOUNTER — Other Ambulatory Visit: Payer: Self-pay | Admitting: Family Medicine

## 2020-01-27 DIAGNOSIS — R319 Hematuria, unspecified: Secondary | ICD-10-CM

## 2020-01-28 ENCOUNTER — Ambulatory Visit: Payer: Self-pay | Attending: Critical Care Medicine

## 2020-01-28 DIAGNOSIS — Z23 Encounter for immunization: Secondary | ICD-10-CM

## 2020-01-28 NOTE — Progress Notes (Signed)
   Covid-19 Vaccination Clinic  Name:  Andrea Briggs    MRN: [id number] DOB: 11-Feb-1976  01/28/2020  [name] was observed post Covid-19 immunization for 15 minutes without incident. She was provided with Vaccine Information Sheet and instruction to access the V-Safe system.   [name] was instructed to call 911 with any severe reactions post vaccine: Marland Kitchen Difficulty breathing  . Swelling of face and throat  . A fast heartbeat  . A bad rash all over body  . Dizziness and weakness   Immunizations Administered    Name Date Dose VIS Date Route   Moderna COVID-19 Vaccine 01/28/2020 11:03 AM 0.5 mL 06/2019 Intramuscular   Manufacturer: Levan Hurst   Lot: [id number]   [place]: 29191-660-60

## 2020-02-06 ENCOUNTER — Emergency Department (HOSPITAL_BASED_OUTPATIENT_CLINIC_OR_DEPARTMENT_OTHER)
Admission: EM | Admit: 2020-02-06 | Discharge: 2020-02-06 | Disposition: A | Discharge status: Home / Self Care | Payer: Medicare HMO | Attending: Emergency Medicine | Admitting: Emergency Medicine

## 2020-02-06 ENCOUNTER — Emergency Department (HOSPITAL_BASED_OUTPATIENT_CLINIC_OR_DEPARTMENT_OTHER): Payer: Medicare HMO

## 2020-02-06 ENCOUNTER — Other Ambulatory Visit: Payer: Self-pay

## 2020-02-06 ENCOUNTER — Telehealth: Payer: Self-pay

## 2020-02-06 ENCOUNTER — Encounter (HOSPITAL_BASED_OUTPATIENT_CLINIC_OR_DEPARTMENT_OTHER): Payer: Self-pay | Admitting: *Deleted

## 2020-02-06 DIAGNOSIS — W548XXA Other contact with dog, initial encounter: Secondary | ICD-10-CM | POA: Insufficient documentation

## 2020-02-06 DIAGNOSIS — R519 Headache, unspecified: Secondary | ICD-10-CM | POA: Insufficient documentation

## 2020-02-06 DIAGNOSIS — Y939 Activity, unspecified: Secondary | ICD-10-CM | POA: Insufficient documentation

## 2020-02-06 DIAGNOSIS — W19XXXA Unspecified fall, initial encounter: Secondary | ICD-10-CM

## 2020-02-06 DIAGNOSIS — S61216A Laceration without foreign body of right little finger without damage to nail, initial encounter: Secondary | ICD-10-CM | POA: Diagnosis not present

## 2020-02-06 DIAGNOSIS — M2550 Pain in unspecified joint: Secondary | ICD-10-CM | POA: Diagnosis not present

## 2020-02-06 DIAGNOSIS — S60946A Unspecified superficial injury of right little finger, initial encounter: Secondary | ICD-10-CM | POA: Diagnosis present

## 2020-02-06 DIAGNOSIS — R9082 White matter disease, unspecified: Secondary | ICD-10-CM | POA: Diagnosis not present

## 2020-02-06 DIAGNOSIS — Y929 Unspecified place or not applicable: Secondary | ICD-10-CM | POA: Insufficient documentation

## 2020-02-06 DIAGNOSIS — Y999 Unspecified external cause status: Secondary | ICD-10-CM | POA: Diagnosis not present

## 2020-02-06 DIAGNOSIS — N189 Chronic kidney disease, unspecified: Secondary | ICD-10-CM | POA: Diagnosis not present

## 2020-02-06 DIAGNOSIS — W01198A Fall on same level from slipping, tripping and stumbling with subsequent striking against other object, initial encounter: Secondary | ICD-10-CM | POA: Insufficient documentation

## 2020-02-06 MED ORDER — ACETAMINOPHEN 325 MG PO TABS
650.0000 mg | ORAL_TABLET | Freq: Once | ORAL | Status: AC
  Administered 2020-02-06: 650 mg via ORAL
  Filled 2020-02-06: qty 2

## 2020-02-06 NOTE — ED Notes (Signed)
Patient transported to X-ray 

## 2020-02-06 NOTE — ED Triage Notes (Addendum)
Pt /o fall x 2 hrs ago, hitting head , denies loc , c/o right 5th finger and right leg pain.  HX of MS and Chronic pain

## 2020-02-06 NOTE — ED Notes (Signed)
Speech wnl, facial symmetry is noted. States the dog caused the fall

## 2020-02-06 NOTE — ED Notes (Signed)
Fell off porch , approx 1 foot in height, fell today. Fell onto concrete. States fell onto left side hitting left side of body and hit left side of head, also rec injury to rt 5 digit as well.

## 2020-02-06 NOTE — Telephone Encounter (Signed)
Nurse Assessment Nurse: May, RN, Tammy Date/Time Eilene Ghazi Time): 02/06/2020 12:11:05 PM Confirm and document reason for call. If symptomatic, describe symptoms. ---Caller states she fell off the porch and landed on concrete. Caller states she hit her head, left shoulder and left side. Caller states her right pinky has a cut on it. She now has a headache and nausea. Has the patient had close contact with a person known or suspected to have the novel coronavirus illness OR traveled / lives in area with major community spread (including international travel) in the last 14 days from the onset of symptoms? * If Asymptomatic, screen for exposure and travel within the last 14 days. ---No Does the patient have any new or worsening symptoms? ---Yes Will a triage be completed? ---Yes Related visit to physician within the last 2 weeks? ---No Does the PT have any chronic conditions? (i.e. diabetes, asthma, this includes High risk factors for pregnancy, etc.) ---Yes List chronic conditions. ---ms, ckd, migraines Is the patient pregnant or possibly pregnant? (Ask all females between the ages of 83-55) ---No Is this a behavioral health or substance abuse call? ---No Guidelines Guideline Title Affirmed Question Affirmed Notes Nurse Date/Time (Eastern Time) Head Injury Knocked out (unconscious) > 1 minute May, RN, Tammy 02/06/2020 12:13:36 PMPLEASE NOTE: All timestamps contained within this report are represented as [place] Standard Time. CONFIDENTIALTY NOTICE: This fax transmission is intended only for the addressee. It contains information that is legally privileged, confidential or otherwise protected from use or disclosure. If you are not the intended recipient, you are strictly prohibited from reviewing, disclosing, copying using or disseminating any of this information or taking any action in reliance on or regarding this information. If you have received this fax in error, please notify us  immediately by telephone so that we can arrange for its return to [place]. Phone: [phone], Toll-Free: [phone], Fax: [phone] Page: 2 of 2 Call Id: [id number] [place]. Time Eilene Ghazi Time) Disposition Final User 02/06/2020 12:09:46 PM Send to Urgent Langston Masker 02/06/2020 12:21:32 PM 911 Outcome Documentation May, RN, Tammy Reason: Call back to caller, went to VM. Left message stating to call back if needed. 02/06/2020 12:14:49 PM Call EMS 911 Now Yes May, RN, Tammy Caller Disagree/Comply Comply Caller Understands Yes PreDisposition Did not know what to do Care Advice Given Per Guideline CALL EMS 911 NOW: * Immediate medical attention is needed. You need to hang up and call 911 (or an ambulance). * Triager Discretion: I'll call you back in a few minutes to be sure you were able to reach them. CARE ADVICE given per Head Injury (Adult) guideline. Referrals GO TO FACILITY UNDECIDED

## 2020-02-06 NOTE — ED Provider Notes (Signed)
[facility] EMERGENCY DEPARTMENT Provider Note   CSN: [id number] Arrival date & time: 02/06/20  1244     History Chief Complaint  Patient presents with  . Fall    Andrea Briggs is a 44 y.o. female with pertinent past medical history of MS, migraine, depression, chronic pain, chronic kidney disease that presents the emergency department that presents the emergency department today for mechanical  fall.  Patient states that she fell off her porch and fell onto concrete, hit her head onto the ground because her dog pulled her.  Is complaining of a headache, states that she is unsure if she lost consciousness, she states states that she thinks she did.  Did review triage note which states that she did not, however when speaking to patient but that she states that she actually thinks she did.  Patient states that she also hit her fifth finger on the way down and her right leg.  Is up-to-date on tetanus.  Denies any vision changes, weakness, paresthesias, abdominal pain, vomiting, chest pain, shortness of breath, back pain.  Patient states that she did feel slightly nauseous.  States that she was in normal health yesterday.  Denies any alcohol board.  Denies blood thinners.  Has not taken any medications today.  States that she think she fell because her dog pulled her off the porch.  Denies any previous head injury, states that she does have some neck stiffness from the fall.  Does have chronic pain here.  Has not taken anything for this.  Denies any fevers, chills, recent illness, seizure like activity.   HPI     Past Medical History:  Diagnosis Date  . Anxiety   . Chronic kidney disease (CKD)    follows w nephro  . Chronic pain   . DDD (degenerative disc disease), cervical   . DDD (degenerative disc disease), lumbar   . Depression   . Migraine   . MS (multiple sclerosis) ([place])   . Vertigo     Patient Active Problem List   Diagnosis Date Noted  . Weakness of both lower  extremities 04/30/2018  . Greater trochanteric bursitis of left hip 03/10/2018  . Aortic atherosclerosis ([place]) 11/12/2017  . Fibromyalgia 08/13/2017  . MS (multiple sclerosis) ([place])   . DDD (degenerative disc disease), cervical   . DDD (degenerative disc disease), lumbar   . Chronic kidney disease (CKD)   . Chronic pain   . Carpal tunnel syndrome of left wrist 09/16/2013    Past Surgical History:  Procedure Laterality Date  . carpel tunnel    . HERNIA REPAIR       OB History   No obstetric history on file.     Family History  Problem Relation Age of Onset  . Cancer Maternal Grandmother        breast cancer  . Stroke Maternal Grandfather     Social History   Tobacco Use  . Smoking status: Never Smoker  . Smokeless tobacco: Never Used  Substance Use Topics  . Alcohol use: No  . Drug use: No    Home Medications Prior to Admission medications   Medication Sig Start Date End Date Taking? Authorizing Provider  amoxicillin-clavulanate (AUGMENTIN) 875-125 MG tablet Take 1 tablet by mouth 2 (two) times daily. 01/12/20   Colon Branch, MD  atorvastatin (LIPITOR) 40 MG tablet TAKE 1 TABLET BY MOUTH DAILY 11/15/18   Shelda Pal, DO  DULoxetine (CYMBALTA) 60 MG capsule Take 1 capsule (60  mg total) by mouth daily. 11/10/18   Shelda Pal, DO  hydrOXYzine (ATARAX/VISTARIL) 25 MG tablet Take 25 mg by mouth 2 (two) times daily.    [provider]  ibuprofen (ADVIL,MOTRIN) 800 MG tablet Take 800 mg by mouth every 8 (eight) hours as needed.    [provider]  Multiple Vitamin (MULTIVITAMIN) capsule Take 1 capsule by mouth daily.    [provider]  tiZANidine (ZANAFLEX) 2 MG tablet Take by mouth 4 (four) times daily.    [provider]  zonisamide (ZONEGRAN) 100 MG capsule Take 300 mg by mouth daily.    [provider]    Allergies    Nsaids, Ketorolac, and Metoprolol  Review of Systems   Review of Systems    Constitutional: Negative for chills, diaphoresis, fatigue and fever.  HENT: Negative for congestion, sore throat and trouble swallowing.   Eyes: Negative for pain and visual disturbance.  Respiratory: Negative for cough, shortness of breath and wheezing.   Cardiovascular: Negative for chest pain, palpitations and leg swelling.  Gastrointestinal: Negative for abdominal distention, abdominal pain, diarrhea, nausea and vomiting.  Genitourinary: Negative for difficulty urinating.  Musculoskeletal: Positive for arthralgias. Negative for back pain, neck pain and neck stiffness.  Skin: Negative for pallor.  Neurological: Positive for headaches. Negative for dizziness, tremors, seizures, syncope, facial asymmetry, speech difficulty, weakness, light-headedness and numbness.  Psychiatric/Behavioral: Negative for confusion.    Physical Exam Updated Vital Signs BP 121/85 (BP Location: Left Arm)   Pulse 72   Temp 98.4 F (36.9 C) (Oral)   Resp 18   Ht 5\' 6"  (1.676 m)   Wt 94.8 kg   LMP 02/05/2020   SpO2 100%   BMI 33.73 kg/m   Physical Exam Constitutional:      General: She is not in acute distress.    Appearance: Normal appearance. She is not ill-appearing, toxic-appearing or diaphoretic.  HENT:     Head: Normocephalic and atraumatic. No raccoon eyes or Battle's sign.     Mouth/Throat:     Mouth: Mucous membranes are moist.     Pharynx: Oropharynx is clear.  Eyes:     General: No scleral icterus.    Extraocular Movements: Extraocular movements intact.     Pupils: Pupils are equal, round, and reactive to light.  Neck:     Comments: Mild tenderness to cervical paraspinal muscles, no midline tenderness. Able to range neck normally.  Cardiovascular:     Rate and Rhythm: Normal rate and regular rhythm.     Pulses: Normal pulses.     Heart sounds: Normal heart sounds.  Pulmonary:     Effort: Pulmonary effort is normal. No respiratory distress.     Breath sounds: Normal breath sounds.  No stridor. No wheezing, rhonchi or rales.  Chest:     Chest wall: No tenderness.  Abdominal:     General: Abdomen is flat. There is no distension.     Palpations: Abdomen is soft.     Tenderness: There is no abdominal tenderness. There is no guarding or rebound.  Musculoskeletal:        General: No swelling or tenderness. Normal range of motion.     Cervical back: Normal range of motion and neck supple. No rigidity.     Right lower leg: No edema.     Left lower leg: No edema.     Comments: Right fifth digit with 4 mm superficial laceration to distal finger, is healing and scabbed over.  Patient  is able to move all joints with normal sensation.  Radial pulse 2+.  Normal strength of bilateral upper and lower extremities with normal strength.  Right leg with mild tenderness, no deformities noted.  Able to move leg any in all directions.  Patient is is able to walk here without any problems.  PT DP pulses 2+.  Skin:    General: Skin is warm and dry.     Capillary Refill: Capillary refill takes less than 2 seconds.     Coloration: Skin is not pale.  Neurological:     General: No focal deficit present.     Mental Status: She is alert and oriented to person, place, and time.     Comments: Alert. Clear speech. No facial droop. CNIII-XII grossly intact. Bilateral upper and lower extremities' sensation grossly intact. 5/5 symmetric strength with grip strength and with plantar and dorsi flexion bilaterally. Patellar . Normal finger to nose bilaterally. Negative pronator drift. Negative Romberg sign. Gait is steady and intact   Psychiatric:        Mood and Affect: Mood normal.        Behavior: Behavior normal.     ED Results / Procedures / Treatments   Labs (all labs ordered are listed, but only abnormal results are displayed) Labs Reviewed - No data to display  EKG None  Radiology CT Head Wo Contrast  Result Date: 02/06/2020 CLINICAL DATA:  Head injury 2 hours ago with subsequent  headache. Loss of consciousness. History of multiple sclerosis. EXAM: CT HEAD WITHOUT CONTRAST TECHNIQUE: Contiguous axial images were obtained from the base of the skull through the vertex without intravenous contrast. COMPARISON:  CT head 11/17/2014.  MRI brain 07/04/2019. FINDINGS: Brain: There is no evidence of acute intracranial hemorrhage, mass lesion, brain edema or extra-axial fluid collection. The ventricles and subarachnoid spaces are appropriately sized for age. There is no CT evidence of acute cortical infarction. Stable minimal periventricular white matter disease. Vascular:  No hyperdense vessel identified. Skull: Negative for fracture or focal lesion. Sinuses/Orbits: The visualized paranasal sinuses and mastoid air cells are clear. No orbital abnormalities are seen. Other: None. IMPRESSION: Stable head CT. No acute intracranial findings. Electronically Signed   By: Richardean Sale M.D.   On: 02/06/2020 15:28   DG Finger Little Right  Result Date: 02/06/2020 CLINICAL DATA:  Fall. Pain and lac to middle phalanx of right 5th finger. EXAM: RIGHT LITTLE FINGER 2+V COMPARISON:  None. FINDINGS: There is no evidence of fracture or dislocation. There is no evidence of arthropathy or other focal bone abnormality. Soft tissues are unremarkable. IMPRESSION: Negative radiographs of the right fifth digit. Electronically Signed   By: Audie Pinto M.D.   On: 02/06/2020 13:43    Procedures Procedures (including critical care time)  Medications Ordered in ED Medications  acetaminophen (TYLENOL) tablet 650 mg (650 mg Oral Given 02/06/20 1553)    ED Course  I have reviewed the triage vital signs and the nursing notes.  Pertinent labs & imaging results that were available during my care of the patient were reviewed by me and considered in my medical decision making (see chart for details).    MDM Rules/Calculators/A&P                          Andrea Briggs is a 44 y.o. female with pertinent  past medical history of MS, migraine, depression, chronic pain, chronic kidney disease that presents the emergency department that presents  the emergency department today for mechanical  fall.  Patient complaining of headache and finger pain.  Plain films of finger without any fractures dislocations.  Tetanus is up-to-date.  Laceration does not need any stitches, is extremely superficial and is already healing.  We will also obtain CT scan head since patient had injury to head with headache and questionable LOC.  Patient states that she does not think she needs x-rays of her leg.  Normal neuro exam with normal gait.  CT scan of head reveals no acute intracranial abnormality.   Doubt need for further emergent work up at this time. I explained the diagnosis and have given explicit precautions to return to the ER including for any other new or worsening symptoms. The patient understands and accepts the medical plan as it's been dictated and I have answered their questions. Discharge instructions concerning home care and prescriptions have been given. The patient is STABLE and is discharged to home in good condition.    Final Clinical Impression(s) / ED Diagnoses Final diagnoses:  Fall, initial encounter    Rx / DC Orders ED Discharge Orders    None       Alfredia Client, PA-C 02/06/20 1603    Malvin Johns, MD 02/09/20 1504

## 2020-02-06 NOTE — Discharge Instructions (Addendum)
You are seen today for a fall, I want you to take Tylenol as prescribed on the bottle for pain.  I want you to follow-up with your primary care in the next couple of days.  If you have any new or worsening concerning symptoms please come back to the emergency department, these can include worsening head pain, vision changes, weakness, numbness and tingling that is new, or falls, urinary problems.

## 2020-02-06 NOTE — ED Notes (Signed)
Static finger splint applied and coban Per RN

## 2020-02-06 NOTE — ED Notes (Signed)
PT finger irrigated and also cleaned with wound cleanser, 4x4 gauze loosely applied and suture cart is at bedside.

## 2020-02-10 ENCOUNTER — Telehealth: Payer: Self-pay | Admitting: Family Medicine

## 2020-02-10 ENCOUNTER — Other Ambulatory Visit: Payer: Self-pay

## 2020-02-10 ENCOUNTER — Other Ambulatory Visit (INDEPENDENT_AMBULATORY_CARE_PROVIDER_SITE_OTHER): Payer: Medicare HMO

## 2020-02-10 DIAGNOSIS — R319 Hematuria, unspecified: Secondary | ICD-10-CM | POA: Diagnosis not present

## 2020-02-10 LAB — URINALYSIS, MICROSCOPIC ONLY

## 2020-02-10 NOTE — Telephone Encounter (Signed)
Please advise message

## 2020-02-10 NOTE — Telephone Encounter (Signed)
If no available appointments at [facility] or at our sister site, OK to send in macrobid 100 mg bid for 5 d. I usually make people come in for this, but am willing to save her an UC visit given I am out of the office.  Ty.

## 2020-02-10 NOTE — Telephone Encounter (Signed)
Pt stated is having urine infection and is wanting to know if she can get a med for her issues since the meds over the counter is not working. Please advise. Pt tel [phone].

## 2020-02-14 MED ORDER — NITROFURANTOIN MONOHYD MACRO 100 MG PO CAPS: 100.0000 mg | ORAL_CAPSULE | Freq: Two times a day (BID) | ORAL | 0 refills | Status: DC

## 2020-02-14 NOTE — Telephone Encounter (Signed)
Called the patient to inform of PCP instructions. I did apologize regarding the delay in response to this message. She states has not been seen anywhere,  but feeling a little better, but did offer macrobid and she agreed to have sent in to completely take care of this infection. She verbalized understanding and sent in antibiotic to her local pharmcy

## 2020-02-14 NOTE — Addendum Note (Signed)
Addended by: Sharon Seller B on: 02/14/2020 07:11 AM   Modules accepted: Orders

## 2020-02-15 ENCOUNTER — Telehealth: Payer: Self-pay | Admitting: Family Medicine

## 2020-02-15 MED ORDER — FLUCONAZOLE 150 MG PO TABS
ORAL_TABLET | ORAL | 0 refills | Status: DC
Start: 2020-02-15 — End: 2020-03-16

## 2020-02-15 NOTE — Telephone Encounter (Signed)
New message:   Pt is calling and states the Dr sent in the wrong type of medication for her. She states he sent something for a UTI and she needed something for a yeast infection. Please advise.

## 2020-03-16 ENCOUNTER — Ambulatory Visit (INDEPENDENT_AMBULATORY_CARE_PROVIDER_SITE_OTHER): Payer: Medicare HMO | Admitting: Gastroenterology

## 2020-03-16 ENCOUNTER — Encounter: Payer: Self-pay | Admitting: Gastroenterology

## 2020-03-16 VITALS — BP 102/70 | HR 68 | Ht 66.0 in | Wt 205.0 lb

## 2020-03-16 DIAGNOSIS — D509 Iron deficiency anemia, unspecified: Secondary | ICD-10-CM

## 2020-03-16 DIAGNOSIS — R6881 Early satiety: Secondary | ICD-10-CM | POA: Diagnosis not present

## 2020-03-16 DIAGNOSIS — R195 Other fecal abnormalities: Secondary | ICD-10-CM

## 2020-03-16 DIAGNOSIS — Z9114 Patient's other noncompliance with medication regimen: Secondary | ICD-10-CM | POA: Diagnosis not present

## 2020-03-16 DIAGNOSIS — R630 Anorexia: Secondary | ICD-10-CM | POA: Diagnosis not present

## 2020-03-16 MED ORDER — CLENPIQ 10-3.5-12 MG-GM -GM/160ML PO SOLN
1.0000 | ORAL | 0 refills | Status: DC
Start: 2020-03-16 — End: 2020-05-07

## 2020-03-16 NOTE — Progress Notes (Signed)
Chief Complaint: FOBT positive stools, IDA  Referring Provider:     Shelda Pal, DO   HPI:     Andrea Briggs is a 44 y.o. female referred to the [facility] for evaluation of FOBT positive stools and microcytic anemia. She is o/w w/o any complaints.   Baseline Hgb ~12 since at least 2016, but was 11.8 in 07/2019 and Ferritin 6.1, iron 22, sat 6.7% and FOBT+ in 08/2019. Was told to start on ferrous sulfate (but she only started taking 2 days ago).  Hgb contiunued to downtrend in 12/2019 (9.4 with MCV 68).   She is o/w without overt GI blood loss. Reports decreased appetite and intermittent early satiety for last several years since MS diagnosis in 2016.  CT head in 01/2020 stable without acute changes.  Takes all of her medications "as needed", which may be about 1/month.   No previous EGD/colonoscopy.   No known family history of CRC, GI malignancy, liver disease, pancreatic disease, or IBD.    CBC Latest Ref Rng & Units 01/12/2020 08/12/2019 04/29/2017  WBC 4.0 - 10.5 K/uL 5.5 4.7 4.6  Hemoglobin 12.0 - 15.0 g/dL 9.4(L) 11.8(L) 12.2  Hematocrit 36 - 46 % 30.2(L) 36.8 35.9(L)  Platelets 150 - 400 K/uL 334.0 229.0 235        Past Medical History:  Diagnosis Date  . Anxiety   . Chronic kidney disease (CKD)    follows w nephro  . Chronic pain   . DDD (degenerative disc disease), cervical   . DDD (degenerative disc disease), lumbar   . Depression   . Migraine   . MS (multiple sclerosis) ([place])   . Vertigo      Past Surgical History:  Procedure Laterality Date  . carpel tunnel    . HERNIA REPAIR     Family History  Problem Relation Age of Onset  . Cancer Maternal Grandmother        breast cancer  . Stroke Maternal Grandfather    Social History   Tobacco Use  . Smoking status: Never Smoker  . Smokeless tobacco: Never Used  Substance Use Topics  . Alcohol use: No  . Drug use: No   Current Outpatient Medications   Medication Sig Dispense Refill  . DULoxetine (CYMBALTA) 60 MG capsule Take 1 capsule (60 mg total) by mouth daily. 90 capsule 2  . hydrOXYzine (ATARAX/VISTARIL) 25 MG tablet Take 25 mg by mouth 2 (two) times daily.    Marland Kitchen ibuprofen (ADVIL,MOTRIN) 800 MG tablet Take 800 mg by mouth every 8 (eight) hours as needed.    . Multiple Vitamin (MULTIVITAMIN) capsule Take 1 capsule by mouth daily.    . OCRELIZUMAB IV Inject 1 Dose into the vein every 6 (six) months.    Marland Kitchen tiZANidine (ZANAFLEX) 2 MG tablet Take by mouth 4 (four) times daily.    . Vitamin D, Ergocalciferol, (DRISDOL) 1.25 MG (50000 UNIT) CAPS capsule Take 50,000 Units by mouth once a week.    . zonisamide (ZONEGRAN) 100 MG capsule Take 300 mg by mouth daily.     No current facility-administered medications for this visit.   Allergies  Allergen Reactions  . Nsaids Other (See Comments)    nephropathy  . Ketorolac Other (See Comments)    Renal insufficiency  . Metoprolol Swelling     Review of Systems: All systems reviewed and negative except where noted in HPI.  Physical Exam:    Wt Readings from Last 3 Encounters:  03/16/20 205 lb (93 kg)  02/06/20 209 lb (94.8 kg)  01/12/20 208 lb 4 oz (94.5 kg)    BP 102/70   Pulse 68   Ht 5\' 6"  (1.676 m)   Wt 205 lb (93 kg)   BMI 33.09 kg/m  Constitutional:  Pleasant, in no acute distress. Psychiatric: Normal mood and affect. Behavior is normal. EENT: Pupils normal.  Conjunctivae are normal. No scleral icterus. Neck supple. No cervical LAD. Cardiovascular: Normal rate, regular rhythm. No edema Pulmonary/chest: Effort normal and breath sounds normal. No wheezing, rales or rhonchi. Abdominal: Soft, nondistended, nontender. Bowel sounds active throughout. There are no masses palpable. No hepatomegaly. Neurological: Alert and oriented to person place and time. Skin: Skin is warm and dry. No rashes noted.   ASSESSMENT AND PLAN;   1) Iron deficiency anemia 2) FOBT positive  stool  -EGD with duodenal biopsies and colonoscopy to evaluate for etiology -If EGD/colonoscopy unrevealing, plan for VCE -Hold iron 7 days prior to bowel prep -Resume oral iron after procedures, with repeat CBC and iron panel in 2-3 months  3) Early satiety 4) Decreased appetite -Unsure if this is primary GI in origin, or potentially related to diagnosis of MS given close temporal relationship (depression related to diagnosis?),  Medication side effect, or byproduct of not taking medications as prescribed -Evaluate for mucosal/luminal pathology at time of EGD as above -Encouraged to continue p.o. intake as tolerated -CT head in 01/2020 unremarkable for etiology -If ongoing symptoms and EEG unremarkable, consider cross-sectional imaging  5) Medication noncompliance -Takes all of her prescribed medications "as needed".  Discussed how this may complicate not only her underlying medical treatment  The indications, risks, and benefits of EGD and colonoscopy were explained to the patient in detail. Risks include but are not limited to bleeding, perforation, adverse reaction to medications, and cardiopulmonary compromise. Sequelae include but are not limited to the possibility of surgery, hositalization, and mortality. The patient verbalized understanding and wished to proceed. All questions answered, referred to scheduler and bowel prep ordered. Further recommendations pending results of the exam.     Lavena Bullion, DO, FACG  03/16/2020, 2:43 PM   [place], [place]*

## 2020-03-16 NOTE — Patient Instructions (Signed)
If you are age 45 or older, your body mass index should be between 23-30. Your Body mass index is 33.09 kg/m. If this is out of the aforementioned range listed, please consider follow up with your Primary Care Provider.  If you are age 16 or younger, your body mass index should be between 19-25. Your Body mass index is 33.09 kg/m. If this is out of the aformentioned range listed, please consider follow up with your Primary Care Provider.   You have been scheduled for an endoscopy and colonoscopy. Please follow the written instructions given to you at your visit today. Please pick up your prep supplies at the pharmacy within the next 1-3 days. If you use inhalers (even only as needed), please bring them with you on the day of your procedure.  We have sent the following medications to your pharmacy for you to pick up at your convenience: Clenpiq  It was a pleasure to see you today!  Vito Cirigliano, D.O.

## 2020-03-20 DIAGNOSIS — R3129 Other microscopic hematuria: Secondary | ICD-10-CM | POA: Diagnosis not present

## 2020-03-20 DIAGNOSIS — E889 Metabolic disorder, unspecified: Secondary | ICD-10-CM | POA: Diagnosis not present

## 2020-03-20 DIAGNOSIS — T39395A Adverse effect of other nonsteroidal anti-inflammatory drugs [NSAID], initial encounter: Secondary | ICD-10-CM | POA: Diagnosis not present

## 2020-03-20 DIAGNOSIS — N141 Nephropathy induced by other drugs, medicaments and biological substances: Secondary | ICD-10-CM | POA: Diagnosis not present

## 2020-03-20 DIAGNOSIS — R809 Proteinuria, unspecified: Secondary | ICD-10-CM | POA: Diagnosis not present

## 2020-03-20 DIAGNOSIS — E559 Vitamin D deficiency, unspecified: Secondary | ICD-10-CM | POA: Diagnosis not present

## 2020-03-20 DIAGNOSIS — M9080 Osteopathy in diseases classified elsewhere, unspecified site: Secondary | ICD-10-CM | POA: Diagnosis not present

## 2020-03-20 DIAGNOSIS — N182 Chronic kidney disease, stage 2 (mild): Secondary | ICD-10-CM | POA: Diagnosis not present

## 2020-04-07 DIAGNOSIS — R569 Unspecified convulsions: Secondary | ICD-10-CM | POA: Diagnosis not present

## 2020-04-07 DIAGNOSIS — R42 Dizziness and giddiness: Secondary | ICD-10-CM | POA: Diagnosis not present

## 2020-04-07 DIAGNOSIS — I10 Essential (primary) hypertension: Secondary | ICD-10-CM | POA: Diagnosis not present

## 2020-04-07 DIAGNOSIS — R55 Syncope and collapse: Secondary | ICD-10-CM | POA: Diagnosis not present

## 2020-04-07 DIAGNOSIS — G4489 Other headache syndrome: Secondary | ICD-10-CM | POA: Diagnosis not present

## 2020-05-04 ENCOUNTER — Telehealth: Payer: Self-pay | Admitting: Family Medicine

## 2020-05-04 NOTE — Telephone Encounter (Signed)
Medication: fluconazole (DIFLUCAN) 150 MG tablet [[id number]] DISCONTINUED  ibuprofen (ADVIL,MOTRIN) 800 MG tablet [[id number]]     Has the patient contacted their pharmacy? No. (If no, request that the patient contact the pharmacy for the refill.) (If yes, when and what did the pharmacy advise?)  Preferred Pharmacy (with phone number or street name): [facility]  Agent: Please be advised that RX refills may take up to 3 business days. We ask that you follow-up with your pharmacy.

## 2020-05-06 ENCOUNTER — Encounter: Payer: Self-pay | Admitting: Certified Registered Nurse Anesthetist

## 2020-05-06 NOTE — Telephone Encounter (Signed)
OK to fill ibuprofen. Try monostat OTC, if still having issues reach out to GYN or sched appt w [place], can be virtual. Ty.

## 2020-05-07 ENCOUNTER — Other Ambulatory Visit: Payer: Self-pay | Admitting: Gastroenterology

## 2020-05-07 ENCOUNTER — Ambulatory Visit (AMBULATORY_SURGERY_CENTER): Payer: Medicare HMO | Admitting: Gastroenterology

## 2020-05-07 ENCOUNTER — Other Ambulatory Visit: Payer: Self-pay

## 2020-05-07 ENCOUNTER — Encounter: Payer: Self-pay | Admitting: Gastroenterology

## 2020-05-07 VITALS — BP 119/71 | HR 79 | Temp 97.9°F | Resp 25 | Ht 66.0 in | Wt 205.0 lb

## 2020-05-07 DIAGNOSIS — D120 Benign neoplasm of cecum: Secondary | ICD-10-CM

## 2020-05-07 DIAGNOSIS — K269 Duodenal ulcer, unspecified as acute or chronic, without hemorrhage or perforation: Secondary | ICD-10-CM | POA: Diagnosis not present

## 2020-05-07 DIAGNOSIS — K635 Polyp of colon: Secondary | ICD-10-CM

## 2020-05-07 DIAGNOSIS — R195 Other fecal abnormalities: Secondary | ICD-10-CM

## 2020-05-07 DIAGNOSIS — D509 Iron deficiency anemia, unspecified: Secondary | ICD-10-CM

## 2020-05-07 DIAGNOSIS — B9681 Helicobacter pylori [H. pylori] as the cause of diseases classified elsewhere: Secondary | ICD-10-CM | POA: Diagnosis not present

## 2020-05-07 DIAGNOSIS — K2970 Gastritis, unspecified, without bleeding: Secondary | ICD-10-CM | POA: Diagnosis not present

## 2020-05-07 DIAGNOSIS — G35 Multiple sclerosis: Secondary | ICD-10-CM | POA: Diagnosis not present

## 2020-05-07 DIAGNOSIS — K259 Gastric ulcer, unspecified as acute or chronic, without hemorrhage or perforation: Secondary | ICD-10-CM

## 2020-05-07 DIAGNOSIS — K5730 Diverticulosis of large intestine without perforation or abscess without bleeding: Secondary | ICD-10-CM | POA: Diagnosis not present

## 2020-05-07 DIAGNOSIS — K2981 Duodenitis with bleeding: Secondary | ICD-10-CM | POA: Diagnosis not present

## 2020-05-07 DIAGNOSIS — N189 Chronic kidney disease, unspecified: Secondary | ICD-10-CM | POA: Diagnosis not present

## 2020-05-07 DIAGNOSIS — R6881 Early satiety: Secondary | ICD-10-CM

## 2020-05-07 DIAGNOSIS — K2951 Unspecified chronic gastritis with bleeding: Secondary | ICD-10-CM | POA: Diagnosis not present

## 2020-05-07 MED ORDER — SODIUM CHLORIDE 0.9 % IV SOLN: 500.0000 mL | Freq: Once | INTRAVENOUS | Status: DC

## 2020-05-07 MED ORDER — SODIUM CHLORIDE 0.9 % IV SOLN: 500.0000 mL | INTRAVENOUS | Status: DC

## 2020-05-07 MED ORDER — IBUPROFEN 800 MG PO TABS: 800.0000 mg | ORAL_TABLET | Freq: Three times a day (TID) | ORAL | 1 refills | Status: DC | PRN

## 2020-05-07 MED ORDER — PANTOPRAZOLE SODIUM 40 MG PO TBEC: 40.0000 mg | DELAYED_RELEASE_TABLET | Freq: Two times a day (BID) | ORAL | 0 refills | Status: DC

## 2020-05-07 NOTE — Progress Notes (Signed)
Report given to PACU, vss 

## 2020-05-07 NOTE — Progress Notes (Signed)
Called to room to assist during endoscopic procedure.  Patient ID and intended procedure confirmed with present staff. Received instructions for my participation in the procedure from the performing physician.  

## 2020-05-07 NOTE — Patient Instructions (Signed)
Handouts given for Gastritis, Peptic Ulcer disease, polyps and diverticulosis.  Use Protonix as prescribed, you need to pick it up today.  Resume your oral iron therapy as prescribed.  Repeat CBC and Iron blood tests in 2 months. (office will notify you and schedule an appointment).  YOU HAD AN ENDOSCOPIC PROCEDURE TODAY AT [facility]:   Refer to the procedure report that was given to you for any specific questions about what was found during the examination.  If the procedure report does not answer your questions, please call your gastroenterologist to clarify.  If you requested that your care partner not be given the details of your procedure findings, then the procedure report has been included in a sealed envelope for you to review at your convenience later.  YOU SHOULD EXPECT: Some feelings of bloating in the abdomen. Passage of more gas than usual.  Walking can help get rid of the air that was put into your GI tract during the procedure and reduce the bloating. If you had a lower endoscopy (such as a colonoscopy or flexible sigmoidoscopy) you may notice spotting of blood in your stool or on the toilet paper. If you underwent a bowel prep for your procedure, you may not have a normal bowel movement for a few days.  Please Note:  You might notice some irritation and congestion in your nose or some drainage.  This is from the oxygen used during your procedure.  There is no need for concern and it should clear up in a day or so.  SYMPTOMS TO REPORT IMMEDIATELY:   Following lower endoscopy (colonoscopy or flexible sigmoidoscopy):  Excessive amounts of blood in the stool  Significant tenderness or worsening of abdominal pains  Swelling of the abdomen that is new, acute  Fever of 100F or higher   Following upper endoscopy (EGD)  Vomiting of blood or coffee ground material  New chest pain or pain under the shoulder blades  Painful or persistently difficult swallowing  New  shortness of breath  Fever of 100F or higher  Black, tarry-looking stools  For urgent or emergent issues, a gastroenterologist can be reached at any hour by calling [phone]. Do not use MyChart messaging for urgent concerns.    DIET:  We do recommend a small meal at first, but then you may proceed to your regular diet.  Drink plenty of fluids but you should avoid alcoholic beverages for 24 hours.  ACTIVITY:  You should plan to take it easy for the rest of today and you should NOT DRIVE or use heavy machinery until tomorrow (because of the sedation medicines used during the test).    FOLLOW UP: Our staff will call the number listed on your records 48-72 hours following your procedure to check on you and address any questions or concerns that you may have regarding the information given to you following your procedure. If we do not reach you, we will leave a message.  We will attempt to reach you two times.  During this call, we will ask if you have developed any symptoms of COVID 19. If you develop any symptoms (ie: fever, flu-like symptoms, shortness of breath, cough etc.) before then, please call [phone].  If you test positive for Covid 19 in the 2 weeks post procedure, please call and report this information to [place].    If any biopsies were taken you will be contacted by phone or by letter within the next 1-3 weeks.  Please call  us at [phone] if you have not heard about the biopsies in 3 weeks.    SIGNATURES/CONFIDENTIALITY: You and/or your care partner have signed paperwork which will be entered into your electronic medical record.  These signatures attest to the fact that that the information above on your After Visit Summary has been reviewed and is understood.  Full responsibility of the confidentiality of this discharge information lies with you and/or your care-partner.

## 2020-05-07 NOTE — Op Note (Signed)
[place] Patient Name: Andrea Briggs Procedure Date: 05/07/2020 10:40 AM MRN: [id number] Endoscopist: Gerrit Heck , MD Age: 44 Referring MD:  Date of Birth: [birth year] Gender: Female Account #: [id number] Procedure:                Colonoscopy Indications:              This is the patient's first colonoscopy, Heme                            positive stool, Iron deficiency anemia Medicines:                Monitored Anesthesia Care Procedure:                Pre-Anesthesia Assessment:                           - Prior to the procedure, a History and Physical                            was performed, and patient medications and                            allergies were reviewed. The patient's tolerance of                            previous anesthesia was also reviewed. The risks                            and benefits of the procedure and the sedation                            options and risks were discussed with the patient.                            All questions were answered, and informed consent                            was obtained. Prior Anticoagulants: The patient has                            taken no previous anticoagulant or antiplatelet                            agents. ASA Grade Assessment: II - A patient with                            mild systemic disease. After reviewing the risks                            and benefits, the patient was deemed in                            satisfactory condition to undergo the procedure.  After obtaining informed consent, the colonoscope                            was passed under direct vision. Throughout the                            procedure, the patient's blood pressure, pulse, and                            oxygen saturations were monitored continuously. The                            Colonoscope was introduced through the anus and                            advanced to the the  terminal ileum. The colonoscopy                            was performed without difficulty. The patient                            tolerated the procedure well. The quality of the                            bowel preparation was good. The terminal ileum,                            ileocecal valve, appendiceal orifice, and rectum                            were photographed. Scope In: 10:56:21 AM Scope Out: 11:08:19 AM Scope Withdrawal Time: 0 hours 10 minutes 27 seconds  Total Procedure Duration: 0 hours 11 minutes 58 seconds  Findings:                 The perianal and digital rectal examinations were                            normal.                           A 4 mm polyp was found in the cecum. The polyp was                            sessile. The polyp was removed with a cold snare.                            Resection and retrieval were complete. Estimated                            blood loss was minimal.                           Multiple small and large-mouthed diverticula were  found in the sigmoid colon, descending colon and                            transverse colon.                           The retroflexed view of the distal rectum and anal                            verge was normal and showed no anal or rectal                            abnormalities.                           The terminal ileum appeared normal. Complications:            No immediate complications. Estimated Blood Loss:     Estimated blood loss was minimal. Impression:               - One 4 mm polyp in the cecum, removed with a cold                            snare. Resected and retrieved.                           - Diverticulosis in the sigmoid colon, in the                            descending colon and in the transverse colon.                           - The colonic mucosa was otherwise normal through                            the remainder of the colon.                            - The distal rectum and anal verge are normal on                            retroflexion view.                           - The examined portion of the ileum was normal. Recommendation:           - Patient has a contact number available for                            emergencies. The signs and symptoms of potential                            delayed complications were discussed with the  patient. Return to normal activities tomorrow.                            Written discharge instructions were provided to the                            patient.                           - Resume previous diet.                           - Continue present medications.                           - Await pathology results.                           - Repeat colonoscopy for surveillance based on                            pathology results.                           - Return to GI office at appointment to be                            scheduled. Gerrit Heck, MD 05/07/2020 11:22:57 AM

## 2020-05-07 NOTE — Op Note (Signed)
[place] Patient Name: Andrea Briggs Procedure Date: 05/07/2020 10:41 AM MRN: [id number] Endoscopist: Gerrit Heck , MD Age: 44 Referring MD:  Date of Birth: 31-Aug-1975 Gender: Female Account #: [id number] Procedure:                Upper GI endoscopy Indications:              Iron deficiency anemia, Heme positive stool Medicines:                Monitored Anesthesia Care Procedure:                Pre-Anesthesia Assessment:                           - Prior to the procedure, a History and Physical                            was performed, and patient medications and                            allergies were reviewed. The patient's tolerance of                            previous anesthesia was also reviewed. The risks                            and benefits of the procedure and the sedation                            options and risks were discussed with the patient.                            All questions were answered, and informed consent                            was obtained. Prior Anticoagulants: The patient has                            taken no previous anticoagulant or antiplatelet                            agents. ASA Grade Assessment: II - A patient with                            mild systemic disease. After reviewing the risks                            and benefits, the patient was deemed in                            satisfactory condition to undergo the procedure.                           After obtaining informed consent, the endoscope was  passed under direct vision. Throughout the                            procedure, the patient's blood pressure, pulse, and                            oxygen saturations were monitored continuously. The                            Endoscope was introduced through the mouth, and                            advanced to the second part of duodenum. The upper                            GI  endoscopy was accomplished without difficulty.                            The patient tolerated the procedure well. Scope In: Scope Out: Findings:                 The examined esophagus was normal.                           Localized mild inflammation characterized by                            congestion (edema), erythema and 3 small, shallow                            ulcerations was found in the gastric antrum.                            Biopsies were taken with a cold forceps for                            histology. Estimated blood loss was minimal.                           The gastric fundus and gastric body were normal.                            Additional biopsies were taken with a cold forceps                            for Helicobacter pylori testing. Estimated blood                            loss was minimal.                           One non-bleeding cratered duodenal ulcer with no                            stigmata of bleeding was found  in the duodenal                            bulb. The lesion was 4 mm in largest dimension.                            Biopsies were taken with a cold forceps for                            histology. Estimated blood loss was minimal.                           The second portion of the duodenum was normal.                            Biopsies for histology were taken with a cold                            forceps for evaluation of celiac disease. Estimated                            blood loss was minimal. Complications:            No immediate complications. Estimated Blood Loss:     Estimated blood loss was minimal. Impression:               - Normal esophagus.                           - Gastritis. Biopsied.                           - Normal gastric fundus and gastric body. Biopsied.                           - Non-bleeding duodenal ulcer with no stigmata of                            bleeding. Biopsied.                           -  Normal second portion of the duodenum. Biopsied. Recommendation:           - Patient has a contact number available for                            emergencies. The signs and symptoms of potential                            delayed complications were discussed with the                            patient. Return to normal activities tomorrow.                            Written discharge instructions were provided to the  patient.                           - Resume previous diet.                           - Continue present medications.                           - Await pathology results.                           - Use Protonix (pantoprazole) 40 mg PO BID for 6                            weeks to promote mucosal healing, then reduce to 40                            mg daily, and can potentially titrate off                            completely.                           - Resume oral iron therapy as prescribed.                           - Colonoscopy today.                           - Repeat CBC and iron panel in 2 months.                           - Return to GI clinic at appointment to be                            scheduled. If ongoing iron deficiency anemia                            despite adequate treatment of the ulcers and                            continued oral iron therapy, and if biopsies                            otherwise unrevealing, will plan on small bowel                            interrogation with Video Capsule Endoscopy. Gerrit Heck, MD 05/07/2020 11:18:36 AM

## 2020-05-07 NOTE — Progress Notes (Signed)
1042 Robinul 0.1 mg IV given due large amount of secretions upon assessment.  MD made aware, vss  

## 2020-05-07 NOTE — Addendum Note (Signed)
Addended by: Sharon Seller B on: 05/07/2020 07:05 AM   Modules accepted: Orders

## 2020-05-07 NOTE — Telephone Encounter (Signed)
Sent in ibuprofen. Patient informed of PCP instructions. She did not request diflucan.

## 2020-05-09 ENCOUNTER — Telehealth: Payer: Self-pay | Admitting: *Deleted

## 2020-05-09 ENCOUNTER — Telehealth: Payer: Self-pay | Admitting: Gastroenterology

## 2020-05-09 NOTE — Telephone Encounter (Signed)
Pt is requesting a pre auth for her protonix.

## 2020-05-09 NOTE — Telephone Encounter (Signed)
  Follow up Call-  Call back number 05/07/2020  Post procedure Call Back phone  # [phone]  Permission to leave phone message Yes  Some recent data might be hidden     Patient questions:  Do you have a fever, pain , or abdominal swelling? No. Pain Score  0 *  Have you tolerated food without any problems? Yes.    Have you been able to return to your normal activities? Yes.    Do you have any questions about your discharge instructions: Diet   No. Medications  No. Follow up visit  No.  Do you have questions or concerns about your Care? No.  Actions: * If pain score is 4 or above: No action needed, pain <4.  1. Have you developed a fever since your procedure? no  2.   Have you had an respiratory symptoms (SOB or cough) since your procedure? no  3.   Have you tested positive for COVID 19 since your procedure no  4.   Have you had any family members/close contacts diagnosed with the COVID 19 since your procedure?  no   If yes to any of these questions please route to Joylene John, RN and Joella Prince, RN

## 2020-05-09 NOTE — Telephone Encounter (Signed)
Sent a request for pa per patients request to cover my meds for pantoprazole. Cover my meds states Josem Kaufmann is not needed.

## 2020-05-10 ENCOUNTER — Ambulatory Visit: Payer: Medicare HMO

## 2020-05-10 NOTE — Telephone Encounter (Signed)
Contacted [facility] and spoke with Janett Billow, the pantoprazole does not need a PA. She will release the medication and contact the patient.

## 2020-05-11 ENCOUNTER — Other Ambulatory Visit: Payer: Self-pay

## 2020-05-11 ENCOUNTER — Ambulatory Visit (INDEPENDENT_AMBULATORY_CARE_PROVIDER_SITE_OTHER): Payer: Medicare HMO

## 2020-05-11 DIAGNOSIS — Z23 Encounter for immunization: Secondary | ICD-10-CM

## 2020-05-22 ENCOUNTER — Other Ambulatory Visit: Payer: Self-pay | Admitting: Gastroenterology

## 2020-05-22 DIAGNOSIS — D509 Iron deficiency anemia, unspecified: Secondary | ICD-10-CM

## 2020-05-28 ENCOUNTER — Telehealth: Payer: Self-pay | Admitting: General Surgery

## 2020-05-28 DIAGNOSIS — A048 Other specified bacterial intestinal infections: Secondary | ICD-10-CM

## 2020-05-28 DIAGNOSIS — G35 Multiple sclerosis: Secondary | ICD-10-CM | POA: Diagnosis not present

## 2020-05-28 MED ORDER — METRONIDAZOLE 250 MG PO TABS: 250.0000 mg | ORAL_TABLET | Freq: Four times a day (QID) | ORAL | 0 refills | Status: AC

## 2020-05-28 MED ORDER — DOXYCYCLINE MONOHYDRATE 100 MG PO TABS: 100.0000 mg | ORAL_TABLET | Freq: Two times a day (BID) | ORAL | 0 refills | Status: AC

## 2020-05-28 NOTE — Telephone Encounter (Signed)
Left a voicemail for the patient to contact the office regarding her ECL results and rxs that were sent to her pharmacy and h pylori stool antigen order was placed in epic for 07/09/2020. Sent patient a my chart message

## 2020-05-28 NOTE — Telephone Encounter (Signed)
The patient called the office and was not able to get into her my chart. Went over message with her and medication instructions for H. Pylori. Patient requested that I mail her the instructions for quad medications. She did verbalize understanding and will head to pick up her prescriptions at the pharmacy

## 2020-05-28 NOTE — Telephone Encounter (Signed)
-----   Message from Lavena Bullion, DO sent at 05/24/2020  4:24 PM EDT ----- Pathology results from the recent upper endoscopy and colonoscopy are as follows:Colonoscopy:-Benign lymphoid aggregate.  No evidence of dysplasia.-Repeat colonoscopy in 10 years for ongoing colon cancer screeningUpper Endoscopy:-The biopsies from the small intestine demonstrate benign peptic duodenitis.  No evidence of Celiac Disease-The biopsies from the recent upper GI Endoscopy were notable for H. Pylori gastritis, and will plan on treating with quad therapy as below. Please confirm no medication allergies to the prescribed regimen.   1) Continue current acid suppression therapy as prescribed 2) Pepto Bismol 2 tabs (262 mg each) 4 times a day x 14 d 3) Metronidazole 250 mg 4 times a day x 14 d 4) doxycycline 100 mg 2 times a day x 14 d  -After 6 weeks, okay to stop PPI. -4 weeks after treatment completed, check H. Pylori stool antigen to confirm eradication (must be off acid suppression therapy)-Schedule follow-up in the GI clinic  Dx: H. Pylori gastritis

## 2020-07-30 ENCOUNTER — Telehealth (INDEPENDENT_AMBULATORY_CARE_PROVIDER_SITE_OTHER): Payer: Medicare HMO | Admitting: Family Medicine

## 2020-07-30 ENCOUNTER — Encounter: Payer: Self-pay | Admitting: Family Medicine

## 2020-07-30 ENCOUNTER — Other Ambulatory Visit: Payer: Self-pay

## 2020-07-30 VITALS — Ht 66.0 in

## 2020-07-30 DIAGNOSIS — R509 Fever, unspecified: Secondary | ICD-10-CM | POA: Insufficient documentation

## 2020-07-30 NOTE — Progress Notes (Signed)
Virtual Visit via Video Note  I connected with Andrea Briggs on 07/30/20 at  3:40 PM EST by a video enabled telemedicine application and verified that I am speaking with the correct person using two identifiers.  Location: Patient: home in dark alone  Provider: office    I discussed the limitations of evaluation and management by telemedicine and the availability of in person appointments. The patient expressed understanding and agreed to proceed.  History of Present Illness: Pt is home c/o fever , chills and no appetite with diarrhea x 7 days ,  _+cough Pt called ems last night and they came to the house and told her she might have covid  Ems said her heart rate was high but her ox was ok.    She is feeling better today but wants to get tested   Past Medical History:  Diagnosis Date  . Anemia   . Anxiety   . Chronic kidney disease (CKD)    follows w nephro  . Chronic pain   . DDD (degenerative disc disease), cervical   . DDD (degenerative disc disease), lumbar   . Depression   . Migraine   . MS (multiple sclerosis) ([place])   . Neuromuscular disorder ([place])    MS dx 2016  . Vertigo    Current Outpatient Medications on File Prior to Visit  Medication Sig Dispense Refill  . DULoxetine (CYMBALTA) 60 MG capsule Take 1 capsule (60 mg total) by mouth daily. 90 capsule 2  . hydrOXYzine (ATARAX/VISTARIL) 25 MG tablet Take 25 mg by mouth 2 (two) times daily.    Marland Kitchen ibuprofen (ADVIL) 800 MG tablet Take 1 tablet (800 mg total) by mouth every 8 (eight) hours as needed. 30 tablet 1  . Multiple Vitamin (MULTIVITAMIN) capsule Take 1 capsule by mouth daily.    . OCRELIZUMAB IV Inject 1 Dose into the vein every 6 (six) months.    . pantoprazole (PROTONIX) 40 MG tablet Take 1 tablet (40 mg total) by mouth 2 (two) times daily. Take Pantoprazole 40 mg twice a day for 6 weeks, then take once a day. 120 tablet 0  . tiZANidine (ZANAFLEX) 2 MG tablet Take by mouth 4 (four) times daily.    . Vitamin D,  Ergocalciferol, (DRISDOL) 1.25 MG (50000 UNIT) CAPS capsule Take 50,000 Units by mouth once a week.    . zonisamide (ZONEGRAN) 100 MG capsule Take 300 mg by mouth daily.     Current Facility-Administered Medications on File Prior to Visit  Medication Dose Route Frequency Provider Last Rate Last Admin  . 0.9 %  sodium chloride infusion  500 mL Intravenous Continuous Cirigliano, Vito V, DO        Observations/Objective: Vitals:   No vitals obtained Temp 99 last night  Pt is in nad  No sob   Assessment and Plan: 1. Fever and chills Pt will schedule covid test She is feeling better today --- tylenol for fever  Pt encouraged to drink plenty of fluids  And to make she she is eating -- she can drink ensure / boost if needed  F/u with test results -- if neg f/u pcp   Follow Up Instructions:    I discussed the assessment and treatment plan with the patient. The patient was provided an opportunity to ask questions and all were answered. The patient agreed with the plan and demonstrated an understanding of the instructions.   The patient was advised to call back or seek an in-person evaluation if the  symptoms worsen or if the condition fails to improve as anticipated.  I provided 25 minutes of non-face-to-face time during this encounter.   Ann Held, DO

## 2020-08-25 ENCOUNTER — Other Ambulatory Visit: Payer: Self-pay | Admitting: Family Medicine

## 2020-09-12 ENCOUNTER — Telehealth: Payer: Self-pay | Admitting: Family Medicine

## 2020-09-12 NOTE — Telephone Encounter (Signed)
Mailed paperwork to her home address

## 2020-09-12 NOTE — Telephone Encounter (Signed)
Patient called back and would like her documents mailed to her home address

## 2020-09-12 NOTE — Telephone Encounter (Signed)
Called the patient informed per PCP instructions he does not do Long Term Disability. Will put at the front desk for her to pickup at convenience.

## 2020-09-12 NOTE — Telephone Encounter (Signed)
Pt dropped off document to be filled out by provider (LTD paperwork - 15 pages [place]) Pt would like document to be faxed when ready to [phone] and let pt know when document was faxed, pt tel [phone]. Document put at front office tray under providers name.

## 2020-10-10 ENCOUNTER — Telehealth (INDEPENDENT_AMBULATORY_CARE_PROVIDER_SITE_OTHER): Payer: Medicare HMO | Admitting: Family Medicine

## 2020-10-10 ENCOUNTER — Encounter: Payer: Self-pay | Admitting: Family Medicine

## 2020-10-10 ENCOUNTER — Other Ambulatory Visit: Payer: Self-pay

## 2020-10-10 DIAGNOSIS — I700 Atherosclerosis of aorta: Secondary | ICD-10-CM

## 2020-10-10 DIAGNOSIS — G35 Multiple sclerosis: Secondary | ICD-10-CM | POA: Diagnosis not present

## 2020-10-10 DIAGNOSIS — R634 Abnormal weight loss: Secondary | ICD-10-CM

## 2020-10-10 DIAGNOSIS — M5136 Other intervertebral disc degeneration, lumbar region: Secondary | ICD-10-CM

## 2020-10-10 DIAGNOSIS — G894 Chronic pain syndrome: Secondary | ICD-10-CM | POA: Diagnosis not present

## 2020-10-10 DIAGNOSIS — M51369 Other intervertebral disc degeneration, lumbar region without mention of lumbar back pain or lower extremity pain: Secondary | ICD-10-CM

## 2020-10-10 DIAGNOSIS — R5383 Other fatigue: Secondary | ICD-10-CM | POA: Diagnosis not present

## 2020-10-10 MED ORDER — VENLAFAXINE HCL ER 75 MG PO CP24: 75.0000 mg | ORAL_CAPSULE | Freq: Every day | ORAL | 3 refills | Status: DC

## 2020-10-10 MED ORDER — MELOXICAM 7.5 MG PO TABS: 7.5000 mg | ORAL_TABLET | Freq: Every day | ORAL | 2 refills | Status: DC

## 2020-10-10 NOTE — Progress Notes (Signed)
Chief Complaint  Patient presents with  . Fatigue  . Weight Loss    Subjective: Patient is a 45 y.o. female here for wt loss and fatigue. Due to COVID-19 pandemic, we are interacting via telephone. I verified patient's ID using 2 identifiers. Patient agreed to proceed with visit via this method. Patient is at home, I am at office. Patient and I are present for visit.   Lost around 20 lbs in past 2 mo and having fatigue after getting sick. No blood in stool/urine, diarrhea, nausea/vomiting, shortness of breath, coughing, skin lesions/moles.  Her diet was decreased but over the past week and a half she has started to resume her normal appetite.  She is having a lot of jt pain. Tylenol is not helping.  She does have a history of chronic pain syndrome and degenerative disc disease in her lumbar and cervical spine.  She has been taking Cymbalta 60 mg daily without adverse effect.  She reports compliance.  She has failed amitriptyline, pregabalin, and gabapentin.  Past Medical History:  Diagnosis Date  . Anemia   . Anxiety   . Chronic kidney disease (CKD)    follows w nephro  . Chronic pain   . DDD (degenerative disc disease), cervical   . DDD (degenerative disc disease), lumbar   . Depression   . Migraine   . MS (multiple sclerosis) ([place])   . Neuromuscular disorder ([place])    MS dx 2016  . Vertigo     Objective: No conversational dyspnea Age appropriate judgment and insight Nml affect and mood  Assessment and Plan: Fatigue, unspecified type - Plan: Comprehensive metabolic panel, IBC + Ferritin, TSH, T4, free, VITAMIN D 25 Hydroxy (Vit-D Deficiency, Fractures), B12  Weight loss - Plan: CBC  DDD (degenerative disc disease), lumbar - Plan: Ambulatory referral to Physical Medicine Rehab, meloxicam (MOBIC) 7.5 MG tablet  Chronic pain syndrome - Plan: Ambulatory referral to Physical Medicine Rehab, venlafaxine XR (EFFEXOR XR) 75 MG 24 hr capsule  MS (multiple sclerosis) (HCC)  Aortic  atherosclerosis (HCC)  Check labs.  If still having issues, I would like to see her in person for weight loss.  Recommended resuming normal diet and adding a protein supplement like Ensure or boost. For her pain, will add low-dose meloxicam for Cox 2 specific inhibition and change Cymbalta to Effexor.  I will refer her to the physical medicine rehabilitation team for further evaluation.  I encouraged her to stay active as I believe this will help as well. Total time: 15 minutes The patient voiced understanding and agreement to the plan.  Wells, DO 10/10/20  3:14 PM

## 2020-10-11 ENCOUNTER — Other Ambulatory Visit (INDEPENDENT_AMBULATORY_CARE_PROVIDER_SITE_OTHER): Payer: Medicare HMO

## 2020-10-11 ENCOUNTER — Other Ambulatory Visit: Payer: Self-pay

## 2020-10-11 DIAGNOSIS — R5383 Other fatigue: Secondary | ICD-10-CM

## 2020-10-11 DIAGNOSIS — R634 Abnormal weight loss: Secondary | ICD-10-CM | POA: Diagnosis not present

## 2020-10-11 LAB — COMPREHENSIVE METABOLIC PANEL
ALT: 13 U/L (ref 0–35)
AST: 12 U/L (ref 0–37)
Albumin: 3.9 g/dL (ref 3.5–5.2)
Alkaline Phosphatase: 69 U/L (ref 39–117)
BUN: 14 mg/dL (ref 6–23)
CO2: 23 mEq/L (ref 19–32)
Calcium: 8.4 mg/dL (ref 8.4–10.5)
Chloride: 108 mEq/L (ref 96–112)
Creatinine, Ser: 0.97 mg/dL (ref 0.40–1.20)
GFR: 70.46 mL/min (ref >60.00)
Glucose, Bld: 100 mg/dL — ABNORMAL HIGH (ref 70–99)
Potassium: 3.9 mEq/L (ref 3.5–5.1)
Sodium: 139 mEq/L (ref 135–145)
Total Bilirubin: 0.5 mg/dL (ref 0.2–1.2)
Total Protein: 6.1 g/dL (ref 6.0–8.3)

## 2020-10-11 LAB — CBC
HCT: 28.7 % — ABNORMAL LOW (ref 36.0–46.0)
Hemoglobin: 9.1 g/dL — ABNORMAL LOW (ref 12.0–15.0)
MCHC: 31.6 g/dL (ref 30.0–36.0)
MCV: 57.6 fl — ABNORMAL LOW (ref 78.0–100.0)
Platelets: 345 10*3/uL (ref 150.0–400.0)
RBC: 4.99 Mil/uL (ref 3.87–5.11)
RDW: 22.8 % — ABNORMAL HIGH (ref 11.5–15.5)
WBC: 3.7 10*3/uL — ABNORMAL LOW (ref 4.0–10.5)

## 2020-10-11 LAB — IBC + FERRITIN
Ferritin: 7.6 ng/mL — ABNORMAL LOW (ref 10.0–291.0)
Iron: 22 ug/dL — ABNORMAL LOW (ref 42–145)
Saturation Ratios: 6.2 % — ABNORMAL LOW (ref 20.0–50.0)
Transferrin: 255 mg/dL (ref 212.0–360.0)

## 2020-10-11 LAB — TSH: TSH: 2.13 u[IU]/mL (ref 0.35–4.50)

## 2020-10-11 LAB — VITAMIN D 25 HYDROXY (VIT D DEFICIENCY, FRACTURES): VITD: 32.71 ng/mL (ref 30.00–100.00)

## 2020-10-11 LAB — T4, FREE: Free T4: 0.8 ng/dL (ref 0.60–1.60)

## 2020-10-11 LAB — VITAMIN B12: Vitamin B-12: 437 pg/mL (ref 211–911)

## 2020-10-16 ENCOUNTER — Other Ambulatory Visit: Payer: Medicare HMO

## 2020-10-16 ENCOUNTER — Encounter: Payer: Self-pay | Admitting: Gastroenterology

## 2020-10-16 ENCOUNTER — Ambulatory Visit (INDEPENDENT_AMBULATORY_CARE_PROVIDER_SITE_OTHER): Payer: Medicare HMO | Admitting: Gastroenterology

## 2020-10-16 ENCOUNTER — Other Ambulatory Visit: Payer: Self-pay

## 2020-10-16 ENCOUNTER — Telehealth: Payer: Self-pay | Admitting: *Deleted

## 2020-10-16 VITALS — BP 112/82 | HR 92 | Ht 66.0 in | Wt 181.0 lb

## 2020-10-16 DIAGNOSIS — K279 Peptic ulcer, site unspecified, unspecified as acute or chronic, without hemorrhage or perforation: Secondary | ICD-10-CM

## 2020-10-16 DIAGNOSIS — R634 Abnormal weight loss: Secondary | ICD-10-CM

## 2020-10-16 DIAGNOSIS — Z8619 Personal history of other infectious and parasitic diseases: Secondary | ICD-10-CM

## 2020-10-16 DIAGNOSIS — D509 Iron deficiency anemia, unspecified: Secondary | ICD-10-CM

## 2020-10-16 NOTE — Patient Instructions (Signed)
If you are age 46 or older, your body mass index should be between 23-30. Your Body mass index is 29.21 kg/m. If this is out of the aforementioned range listed, please consider follow up with your Primary Care Provider.  If you are age 12 or younger, your body mass index should be between 19-25. Your Body mass index is 29.21 kg/m. If this is out of the aformentioned range listed, please consider follow up with your Primary Care Provider.   Please go to the 2nd floor of this building today and schedule your labwork, [address], [address].   Please go to the first floor to schedule your Ct Scan of the abdomen and pelvis.  We have sent a referral to Hematology. They will contact you with an appointment. If you do not hear from them within 1 week,  please contact our office at [phone]  Call to schedule a follow up appointment in 3 months.  Due to recent changes in healthcare laws, you may see the results of your imaging and laboratory studies on MyChart before your provider has had a chance to review them.  We understand that in some cases there may be results that are confusing or concerning to you. Not all laboratory results come back in the same time frame and the provider may be waiting for multiple results in order to interpret others.  Please give [place] 48 hours in order for your provider to thoroughly review all the results before contacting the office for clarification of your results.    Thank you for choosing me and [facility].  Vito Cirigliano, D.O.

## 2020-10-16 NOTE — Progress Notes (Signed)
Chief Complaint: Iron deficiency anemia, weight loss  GI History: 45 year old female with history of CKD, DDD, multiple sclerosis, initially seen in GI clinic in 02/2020 for evaluation of iron deficiency anemia and FOBT positive stool.  She additionally reported early satiety and decreased appetite, which had been ongoing since her diagnosis of MS in 2016.  CT head in 01/2020 unremarkable.  -Baseline Hgb ~12  from 2016-2020 -07/2019: H/H 11.8/36.8, MCV/RDW 75.4/15.8. Ferritin 6.1, iron 22, sat 6.7% and FOBT+ in 08/2019. Was told to start on ferrous sulfate (never started).   -12/2019: H/H 9.4/30.2, MCV/RDW 68.3/17.2.   -EGD (04/2020): Gastritis with 3 shallow antral ulcers (path: H. pylori gastritis), 4 mm DU (path:), Otherwise normal duodenum (path negative for Celiac).  Treated with quadruple therapy -Colonoscopy (04/2020): 4 mm cecal polyp (path: Benign lymphoid aggregate), diverticulosis, normal TI.  Repeat in 10 years -09/2020: H/H 9.1/28.7, MCV/RDW 57.6/22.8.  Ferritin 7.6, iron 22, sat 6.2% (still not taking oral iron).  Normal CMP, B12   HPI:     Andrea Briggs is a 45 y.o. female referred to the Gastroenterology Clinic for ongoing evaluation and treatment of iron deficiency anemia along with weight loss.  Diagnosed with H. pylori on EGD in 04/2020.  Prescribed quadruple therapy, and reports taking as prescribed. Has since stopped all PPI and never went for H. pylori stool antigen to confirm eradication.  Just started taking taking OTC iron tablets this week following most recent lab results above. Had otherwise never really taken any oral iron.   Additionally, reports 20-25 pound weight loss in the last 2 months.  She states she was "sick" in January with decreased appetite and fatigue x9 days. No diarrhea, n/v/f/c, pulmonary symptoms. Never got tested for Covid. No anosmia. Since then, has lost weight despite returning her appetite and PO intake to her baseline (2  meals/day for as long as she can remember). No hematochezia or melena. Does endorse night sweats, but that is unchanged since 2018, and not to the point of changing shirts, sheets, etc at night.    Reviewed medication list with her.  Only takes hydroxyzine and IV ocrelizumab as prescribed. The rest she doesn't take or takes as needed.  Past Medical History:  Diagnosis Date  . Anemia   . Anxiety   . Chronic kidney disease (CKD)    follows w nephro  . Chronic pain   . DDD (degenerative disc disease), cervical   . DDD (degenerative disc disease), lumbar   . Depression   . Migraine   . MS (multiple sclerosis) ([place])   . Neuromuscular disorder ([place])    MS dx 2016  . Vertigo      Past Surgical History:  Procedure Laterality Date  . carpel tunnel    . HERNIA REPAIR     Family History  Problem Relation Age of Onset  . Cancer Maternal Grandmother        breast cancer  . Stroke Maternal Grandfather   . Colon cancer Neg Hx   . Esophageal cancer Neg Hx   . Rectal cancer Neg Hx   . Stomach cancer Neg Hx    Social History   Tobacco Use  . Smoking status: Never Smoker  . Smokeless tobacco: Never Used  Vaping Use  . Vaping Use: Never used  Substance Use Topics  . Alcohol use: No  . Drug use: No   Current Outpatient Medications  Medication Sig Dispense  Refill  . hydrOXYzine (ATARAX/VISTARIL) 25 MG tablet Take 25 mg by mouth 2 (two) times daily.    . meloxicam (MOBIC) 7.5 MG tablet Take 1 tablet (7.5 mg total) by mouth daily. 30 tablet 2  . Multiple Vitamin (MULTIVITAMIN) capsule Take 1 capsule by mouth daily.    . OCRELIZUMAB IV Inject 1 Dose into the vein every 6 (six) months.    . pantoprazole (PROTONIX) 40 MG tablet Take 1 tablet (40 mg total) by mouth 2 (two) times daily. Take Pantoprazole 40 mg twice a day for 6 weeks, then take once a day. 120 tablet 0  . tiZANidine (ZANAFLEX) 2 MG tablet Take by mouth 4 (four) times daily.    Marland Kitchen venlafaxine XR (EFFEXOR XR) 75 MG 24 hr  capsule Take 1 capsule (75 mg total) by mouth daily with breakfast. 30 capsule 3  . Vitamin D, Ergocalciferol, (DRISDOL) 1.25 MG (50000 UNIT) CAPS capsule Take 50,000 Units by mouth once a week.    . zonisamide (ZONEGRAN) 100 MG capsule Take 300 mg by mouth daily.     Current Facility-Administered Medications  Medication Dose Route Frequency Provider Last Rate Last Admin  . 0.9 %  sodium chloride infusion  500 mL Intravenous Continuous Carmita Boom V, DO       Allergies  Allergen Reactions  . Nsaids Other (See Comments)    nephropathy  . Ketorolac Other (See Comments)    Renal insufficiency  . Metoprolol Swelling     Review of Systems: All systems reviewed and negative except where noted in HPI.     Physical Exam:    Wt Readings from Last 3 Encounters:  05/07/20 205 lb (93 kg)  03/16/20 205 lb (93 kg)  02/06/20 209 lb (94.8 kg)    There were no vitals taken for this visit. Constitutional:  Pleasant, in no acute distress. Psychiatric: Normal mood and affect. Behavior is normal. EENT: Pupils normal.  Conjunctivae are normal. No scleral icterus. Neck supple. No cervical LAD. Cardiovascular: Normal rate, regular rhythm. No edema Pulmonary/chest: Effort normal and breath sounds normal. No wheezing, rales or rhonchi. Abdominal: Soft, nondistended, nontender. Bowel sounds active throughout. There are no masses palpable. No hepatomegaly. Neurological: Alert and oriented to person place and time. Skin: Skin is warm and dry. No rashes noted.   ASSESSMENT AND PLAN;   1) Iron deficiency anemia -Referral to Hematology for IV iron as she reports being intolerant to oral iron (and also noncompliant with oral medications typically) -Evaluate for H. pylori eradication below.  If eradicated appropriately and ongoing IDA, plan for VCE for small bowel interrogation  2) Weight loss -Unclear if her weight loss occurred at the time of recent illness in January and has otherwise  remained stable, or if she is actively losing weight, as she does not weigh herself at home.  Nonetheless, 62-94#TMLYYT loss certainly warrants further evaluation -CT abdomen/pelvis -Referral to Hematology -If CT unrevealing, may have low threshold for repeat EGD to ensure resolution of previous gastritis and duodenal ulcer, along with VCD -Again, not clear where she is at with this weight loss, but work-up for underlying depression with PCM reasonable (noncompliant with medications)  3) History of H. pylori 4) Peptic ulcer disease 5) History of duodenal ulcer -Check H. pylori stool Ag for confirmation of eradication -Depending on work-up as above, may need to consider repeat EGD to evaluate for resolution of previously noted ulcers, particularly with ongoing IDA  I spent 35 minutes of time, including in depth chart  review, independent review of results as outlined above, communicating results with the patient directly, face-to-face time with the patient, coordinating care, and ordering studies and medications as appropriate, and documentation.   Lavena Bullion, DO, FACG  10/16/2020, 10:40 AM   Andrea Briggs, [place]*

## 2020-10-16 NOTE — Telephone Encounter (Signed)
Per referral Dr. Waldon Merl - called patient and gave upcoming appointment

## 2020-10-17 ENCOUNTER — Encounter: Payer: Self-pay | Admitting: Hematology & Oncology

## 2020-10-17 ENCOUNTER — Inpatient Hospital Stay: Payer: Medicare HMO | Attending: Hematology & Oncology

## 2020-10-17 ENCOUNTER — Other Ambulatory Visit (INDEPENDENT_AMBULATORY_CARE_PROVIDER_SITE_OTHER): Payer: Medicare HMO

## 2020-10-17 ENCOUNTER — Inpatient Hospital Stay (HOSPITAL_BASED_OUTPATIENT_CLINIC_OR_DEPARTMENT_OTHER): Payer: Medicare HMO | Admitting: Hematology & Oncology

## 2020-10-17 VITALS — BP 103/77 | HR 73 | Temp 98.1°F | Resp 16 | Wt 181.0 lb

## 2020-10-17 DIAGNOSIS — N189 Chronic kidney disease, unspecified: Secondary | ICD-10-CM | POA: Diagnosis not present

## 2020-10-17 DIAGNOSIS — D500 Iron deficiency anemia secondary to blood loss (chronic): Secondary | ICD-10-CM

## 2020-10-17 DIAGNOSIS — R634 Abnormal weight loss: Secondary | ICD-10-CM | POA: Diagnosis not present

## 2020-10-17 DIAGNOSIS — N921 Excessive and frequent menstruation with irregular cycle: Secondary | ICD-10-CM | POA: Diagnosis not present

## 2020-10-17 DIAGNOSIS — K279 Peptic ulcer, site unspecified, unspecified as acute or chronic, without hemorrhage or perforation: Secondary | ICD-10-CM | POA: Diagnosis not present

## 2020-10-17 DIAGNOSIS — G35 Multiple sclerosis: Secondary | ICD-10-CM | POA: Diagnosis not present

## 2020-10-17 HISTORY — DX: Excessive and frequent menstruation with irregular cycle: N92.1

## 2020-10-17 LAB — RETICULOCYTES
Immature Retic Fract: 24.3 % — ABNORMAL HIGH (ref 2.3–15.9)
RBC.: 5.03 MIL/uL (ref 3.87–5.11)
Retic Count, Absolute: 69.9 10*3/uL (ref 19.0–186.0)
Retic Ct Pct: 1.4 % (ref 0.4–3.1)

## 2020-10-17 LAB — CMP (CANCER CENTER ONLY)
ALT: 11 U/L (ref 0–44)
AST: 13 U/L — ABNORMAL LOW (ref 15–41)
Albumin: 4 g/dL (ref 3.5–5.0)
Alkaline Phosphatase: 68 U/L (ref 38–126)
Anion gap: 7 (ref 5–15)
BUN: 11 mg/dL (ref 6–20)
CO2: 27 mmol/L (ref 22–32)
Calcium: 9.5 mg/dL (ref 8.9–10.3)
Chloride: 105 mmol/L (ref 98–111)
Creatinine: 1.08 mg/dL — ABNORMAL HIGH (ref 0.44–1.00)
GFR, Estimated: >60 mL/min (ref >60)
Glucose, Bld: 93 mg/dL (ref 70–99)
Potassium: 4 mmol/L (ref 3.5–5.1)
Sodium: 139 mmol/L (ref 135–145)
Total Bilirubin: 0.7 mg/dL (ref 0.3–1.2)
Total Protein: 6.5 g/dL (ref 6.5–8.1)

## 2020-10-17 LAB — CBC WITH DIFFERENTIAL (CANCER CENTER ONLY)
Abs Immature Granulocytes: 0.01 10*3/uL (ref 0.00–0.07)
Basophils Absolute: 0 10*3/uL (ref 0.0–0.1)
Basophils Relative: 0 %
Eosinophils Absolute: 0.2 10*3/uL (ref 0.0–0.5)
Eosinophils Relative: 5 %
HCT: 31.1 % — ABNORMAL LOW (ref 36.0–46.0)
Hemoglobin: 9.2 g/dL — ABNORMAL LOW (ref 12.0–15.0)
Immature Granulocytes: 0 %
Lymphocytes Relative: 50 %
Lymphs Abs: 2.2 10*3/uL (ref 0.7–4.0)
MCH: 18.2 pg — ABNORMAL LOW (ref 26.0–34.0)
MCHC: 29.6 g/dL — ABNORMAL LOW (ref 30.0–36.0)
MCV: 61.6 fL — ABNORMAL LOW (ref 80.0–100.0)
Monocytes Absolute: 0.5 10*3/uL (ref 0.1–1.0)
Monocytes Relative: 10 %
Neutro Abs: 1.6 10*3/uL — ABNORMAL LOW (ref 1.7–7.7)
Neutrophils Relative %: 35 %
Platelet Count: 330 10*3/uL (ref 150–400)
RBC: 5.05 MIL/uL (ref 3.87–5.11)
RDW: 23.9 % — ABNORMAL HIGH (ref 11.5–15.5)
WBC Count: 4.5 10*3/uL (ref 4.0–10.5)
nRBC: 0 % (ref 0.0–0.2)

## 2020-10-17 LAB — SAVE SMEAR(SSMR), FOR PROVIDER SLIDE REVIEW

## 2020-10-17 NOTE — Progress Notes (Signed)
Referral MD  Reason for Referral: Iron deficiency anemia secondary to menometrorrhagia  Chief Complaint  Patient presents with  . New Patient (Initial Visit)  : I have lost weight and feel tired.  HPI: Andrea Briggs is a very charming 45 year old African-American female.  She is originally from [place].  She has multiple sclerosis.  She has had is under very good control.  She takes infusional therapy for this.  She has lost weight recently.  She is bylaws about 20 pounds she feels.  She is not sure why she is lost the weight.  She felt tired.  She saw her family physician.  She was noted to be anemic.  Of note, back in January 2021, her hemoglobin is 11.8 with hematocrit of 36.8.  The MCV was 75.  In June 2021, her hemoglobin was 9.4.  MCV is down to 68.  GIST 4 days ago, her ferritin was 8 with an iron saturation of only 6%.  She also was found to have some blood in her stool.  As such, she was referred to Dr. Bryan Lemma of gastroenterology.  As always, he was very thorough and did an upper and lower endoscopy on her.  He did find that she has some small ulcerations in her stomach.  The Helicobacter test was negative.  She also had a colonoscopy.  She was found to have a polyp.  This was not malignant.  She had diverticulosis.  She has had no obvious melena or bright red blood per rectum.  She is not diabetic.  She has not been taking any kind of blood thinners or aspirin or nonsteroidals.  She does not smoke.  She does not drink.  I think she is due for a CT scan.  Dr. Bryan Lemma would like to have one done.  She was then referred to [facility] for the iron deficiency anemia.  She does not have sickle cell anemia.  There is no history of sickle cell in the family.  There is history of cancer in the family.  There is a history of breast cancer and lung cancer.  Overall, I would say her performance status is ECOG 1.    Past Medical History:   Diagnosis Date  . Anemia   . Anxiety   . Chronic kidney disease (CKD)    follows w nephro  . Chronic pain   . DDD (degenerative disc disease), cervical   . DDD (degenerative disc disease), lumbar   . Depression   . Migraine   . MS (multiple sclerosis) ([place])   . Neuromuscular disorder ([place])    MS dx 2016  . Vertigo   :  Past Surgical History:  Procedure Laterality Date  . carpel tunnel    . HERNIA REPAIR    :   Current Outpatient Medications:  .  hydrOXYzine (ATARAX/VISTARIL) 25 MG tablet, Take 25 mg by mouth 2 (two) times daily., Disp: , Rfl:  .  meloxicam (MOBIC) 7.5 MG tablet, Take 1 tablet (7.5 mg total) by mouth daily., Disp: 30 tablet, Rfl: 2 .  Multiple Vitamin (MULTIVITAMIN) capsule, Take 1 capsule by mouth daily., Disp: , Rfl:  .  OCRELIZUMAB IV, Inject 1 Dose into the vein every 6 (six) months., Disp: , Rfl:  .  pantoprazole (PROTONIX) 40 MG tablet, Take 1 tablet (40 mg total) by mouth 2 (two) times daily. Take Pantoprazole 40 mg twice a day for 6 weeks, then take once a day., Disp: 120 tablet, Rfl: 0 .  tiZANidine (ZANAFLEX) 2 MG tablet, Take by mouth 4 (four) times daily., Disp: , Rfl:  .  venlafaxine XR (EFFEXOR XR) 75 MG 24 hr capsule, Take 1 capsule (75 mg total) by mouth daily with breakfast., Disp: 30 capsule, Rfl: 3 .  Vitamin D, Ergocalciferol, (DRISDOL) 1.25 MG (50000 UNIT) CAPS capsule, Take 50,000 Units by mouth once a week., Disp: , Rfl:  .  zonisamide (ZONEGRAN) 100 MG capsule, Take 300 mg by mouth daily., Disp: , Rfl:   Current Facility-Administered Medications:  .  0.9 %  sodium chloride infusion, 500 mL, Intravenous, Continuous, Cirigliano, Vito V, DO:  :  Allergies  Allergen Reactions  . Nsaids Other (See Comments)    nephropathy  . Ketorolac Other (See Comments)    Renal insufficiency  . Metoprolol Swelling  :  Family History  Problem Relation Age of Onset  . Cancer Maternal Grandmother        breast cancer  . Stroke Maternal  Grandfather   . Colon cancer Neg Hx   . Esophageal cancer Neg Hx   . Rectal cancer Neg Hx   . Stomach cancer Neg Hx   :  Social History   Socioeconomic History  . Marital status: Married    Spouse name: Not on file  . Number of children: Not on file  . Years of education: Not on file  . Highest education level: Not on file  Occupational History  . Not on file  Tobacco Use  . Smoking status: Never Smoker  . Smokeless tobacco: Never Used  Vaping Use  . Vaping Use: Never used  Substance and Sexual Activity  . Alcohol use: No  . Drug use: No  . Sexual activity: Yes    Partners: Male    Birth control/protection: None  Other Topics Concern  . Not on file  Social History Narrative  . Not on file   Social Determinants of Health   Financial Resource Strain: Not on file  Food Insecurity: Not on file  Transportation Needs: Not on file  Physical Activity: Not on file  Stress: Not on file  Social Connections: Not on file  Intimate Partner Violence: Not on file  :  Review of Systems  Constitutional: Positive for malaise/fatigue and weight loss.  HENT: Negative.   Eyes: Negative.   Respiratory: Negative.   Cardiovascular: Negative.   Gastrointestinal: Negative.   Genitourinary: Negative.   Musculoskeletal: Negative.   Skin: Negative.   Neurological: Positive for tingling and headaches.  Endo/Heme/Allergies: Negative.   Psychiatric/Behavioral: Negative.      Exam:  This is a well-developed well-nourished African-American female in no obvious distress.  Vital signs show temperature of 98.1.  Pulse 73.  Blood pressure 103/77.  Weight is 181 pounds.  Head and neck exam shows no scleral icterus.  Her conjunctiva are pale.  She has no adenopathy in the neck.  She has no glossitis.  There is no palpable thyroid.  Lungs are clear bilaterally.  Cardiac exam regular rate and rhythm with no murmurs, rubs or bruits.  Abdomen is a soft abdomen with good bowel sounds.  She has no  fluid wave.  There is no guarding or rebound tenderness.  There is no palpable liver or spleen tip.  Back exam shows no tenderness over the spine, ribs or hips.  Extremities shows no clubbing, cyanosis or edema.  She has good range of motion of her joints.  Skin exam shows no rashes, ecchymoses or petechia.  Neurological exam shows no  focal neurological deficits.  '@IPVITALS' @   Recent Labs    10/17/20 1055  WBC 4.5  HGB 9.2*  HCT 31.1*  PLT 330   Recent Labs    10/17/20 1055  NA 139  K 4.0  CL 105  CO2 27  GLUCOSE 93  BUN 11  CREATININE 1.08*  CALCIUM 9.5    Blood smear review: Mild anisocytosis and poikilocytosis.  She has hypochromic and microcytic red blood cells.  I see no schistocytes.  She has a few target cells.  There are some ghost cells.  She has no rouleaux formation.  I see no nucleated red blood cells.  White blood cells appear normal in morphology maturation.  She has no hypersegmented polys.  I see no immature myeloid or lymphoid forms.  Platelets are adequate number and size.  Platelets are well granulated.  Pathology: None    Assessment and Plan:   Ms. Bushong is a very charming 45 year old African-American female.  Of note, she has a very strong faith.  I did give her a prayer blanket which she was very appreciative of.  She has iron deficiency anemia.  She still has her monthly cycles.  They are a little bit irregular.  I would not think there is any malignancy.  However, given the history of cancer in the family, I really cannot argue with getting the CT scans on her so we can check to make sure there is nothing going on.  I am sure that her iron levels are quite low.  Her blood smear is highly consistent with iron deficiency anemia.  I think that we can get her to feel better and get her to gain weight with replacement of her iron.  We will set her up with IV iron.  I would like to see her back in 6 weeks.  Of note, I do not believe there is any  indication for a bone marrow biopsy.  I do not see anything on her blood smear that would suggest an underlying bone marrow disorder.  It was fun talking to her.  Again she has a very strong faith.  She very much appreciated the prayer blanket.

## 2020-10-18 ENCOUNTER — Telehealth: Payer: Self-pay

## 2020-10-18 LAB — IRON AND TIBC
Iron: 94 ug/dL (ref 41–142)
Saturation Ratios: 32 % (ref 21–57)
TIBC: 295 ug/dL (ref 236–444)
UIBC: 201 ug/dL (ref 120–384)

## 2020-10-18 LAB — FERRITIN: Ferritin: 16 ng/mL (ref 11–307)

## 2020-10-18 LAB — TSH: TSH: 2.77 u[IU]/mL (ref 0.308–3.960)

## 2020-10-18 NOTE — Telephone Encounter (Signed)
S/w pt and she is aware of her appts per 10/17/20 los  Sajan Cheatwood

## 2020-10-19 ENCOUNTER — Other Ambulatory Visit: Payer: Self-pay | Admitting: Family

## 2020-10-19 ENCOUNTER — Encounter: Payer: Self-pay | Admitting: Physical Medicine and Rehabilitation

## 2020-10-19 LAB — HELICOBACTER PYLORI  SPECIAL ANTIGEN
MICRO NUMBER:: 11711265
RESULT:: DETECTED — AB
SPECIMEN QUALITY: ADEQUATE

## 2020-10-22 ENCOUNTER — Telehealth: Payer: Self-pay | Admitting: General Surgery

## 2020-10-22 DIAGNOSIS — A048 Other specified bacterial intestinal infections: Secondary | ICD-10-CM

## 2020-10-22 MED ORDER — METRONIDAZOLE 500 MG PO TABS: 500.0000 mg | ORAL_TABLET | Freq: Two times a day (BID) | ORAL | 0 refills | Status: DC

## 2020-10-22 MED ORDER — OMEPRAZOLE 20 MG PO CPDR: 20.0000 mg | DELAYED_RELEASE_CAPSULE | Freq: Two times a day (BID) | ORAL | 0 refills | Status: DC

## 2020-10-22 MED ORDER — AMOXICILLIN 500 MG PO TABS: ORAL_TABLET | ORAL | 0 refills | Status: DC

## 2020-10-22 MED ORDER — CLARITHROMYCIN 500 MG PO TABS: 500.0000 mg | ORAL_TABLET | Freq: Two times a day (BID) | ORAL | 0 refills | Status: DC

## 2020-10-22 MED ORDER — CLARITHROMYCIN 500 MG PO TABS: 500.0000 mg | ORAL_TABLET | Freq: Two times a day (BID) | ORAL | 0 refills | Status: AC

## 2020-10-22 MED ORDER — METRONIDAZOLE 500 MG PO TABS: 500.0000 mg | ORAL_TABLET | Freq: Two times a day (BID) | ORAL | 0 refills | Status: AC

## 2020-10-22 NOTE — Telephone Encounter (Signed)
Patient called and said she can not pay for all her medications therefore she is asking which once should she absolutely need to take.

## 2020-10-22 NOTE — Addendum Note (Signed)
Addended by: Lanny Hurst A on: 10/22/2020 10:13 AM   Modules accepted: Orders

## 2020-10-22 NOTE — Telephone Encounter (Signed)
-----   Message from [place], DO sent at 10/19/2020  4:34 PM EDT ----- H. pylori stool antigen is again positive.  Previously treated with quadruple therapy.  Plan for concomitant therapy as below:  Clarithromycin-based concomitant therapy: -Prilosec 20 mg PO BID x14 days -Clarithromycin 500 mg PO BID x14 days -Amoxicillin 1 g PO BID x14 days -Metronidazole 500 mg PO BID x14 days

## 2020-10-22 NOTE — Telephone Encounter (Signed)
Spoke with the patient and explained that she is still positive for h. Pylori. Explained to the patient she will need to do quad therapy again. After she has completed her course of quad therapy she will need to return to have another Stool antigen test. The patient verbalized understanding and will have stool test done at [facility] [place] primary care. Pt would also like her meds called to [facility] on [address] in [place].

## 2020-10-23 ENCOUNTER — Ambulatory Visit: Payer: Medicare HMO

## 2020-10-23 ENCOUNTER — Inpatient Hospital Stay: Payer: Medicare HMO | Attending: Hematology & Oncology

## 2020-10-23 ENCOUNTER — Other Ambulatory Visit: Payer: Self-pay

## 2020-10-23 VITALS — BP 105/59 | HR 78 | Temp 98.1°F | Resp 15

## 2020-10-23 DIAGNOSIS — D500 Iron deficiency anemia secondary to blood loss (chronic): Secondary | ICD-10-CM | POA: Insufficient documentation

## 2020-10-23 DIAGNOSIS — N921 Excessive and frequent menstruation with irregular cycle: Secondary | ICD-10-CM | POA: Insufficient documentation

## 2020-10-23 MED ORDER — SODIUM CHLORIDE 0.9 % IV SOLN
200.0000 mg | Freq: Once | INTRAVENOUS | Status: AC
  Administered 2020-10-23: 200 mg via INTRAVENOUS
  Filled 2020-10-23: qty 200

## 2020-10-23 MED ORDER — SODIUM CHLORIDE 0.9 % IV SOLN
Freq: Once | INTRAVENOUS | Status: AC
  Filled 2020-10-23: qty 250

## 2020-10-23 NOTE — Telephone Encounter (Signed)
Please advise me on what we should do for this patient and her meds to treat h. Pylori?

## 2020-10-23 NOTE — Telephone Encounter (Signed)
All medications had to be taken as directed in order to get the therapy the best chance working.  We can see if Pylera is better covered.  If so, take 3 capsules QID x10 days along with omeprazole 20 mg bid.

## 2020-10-23 NOTE — Patient Instructions (Signed)

## 2020-10-23 NOTE — Telephone Encounter (Signed)
[place] to verify the coverage of Pylera for this patient and was instructed that she picked up the quad therapy yesterday.

## 2020-10-29 ENCOUNTER — Ambulatory Visit (HOSPITAL_BASED_OUTPATIENT_CLINIC_OR_DEPARTMENT_OTHER)
Admission: RE | Admit: 2020-10-29 | Discharge: 2020-10-29 | Disposition: A | Discharge status: Home / Self Care | Payer: Medicare HMO | Source: Ambulatory Visit | Attending: Gastroenterology | Admitting: Gastroenterology

## 2020-10-29 ENCOUNTER — Encounter (HOSPITAL_BASED_OUTPATIENT_CLINIC_OR_DEPARTMENT_OTHER): Payer: Self-pay

## 2020-10-29 ENCOUNTER — Other Ambulatory Visit: Payer: Self-pay

## 2020-10-29 DIAGNOSIS — D509 Iron deficiency anemia, unspecified: Secondary | ICD-10-CM | POA: Diagnosis not present

## 2020-10-29 DIAGNOSIS — D649 Anemia, unspecified: Secondary | ICD-10-CM | POA: Diagnosis not present

## 2020-10-29 DIAGNOSIS — K5730 Diverticulosis of large intestine without perforation or abscess without bleeding: Secondary | ICD-10-CM | POA: Diagnosis not present

## 2020-10-29 DIAGNOSIS — K279 Peptic ulcer, site unspecified, unspecified as acute or chronic, without hemorrhage or perforation: Secondary | ICD-10-CM | POA: Insufficient documentation

## 2020-10-29 DIAGNOSIS — Z8711 Personal history of peptic ulcer disease: Secondary | ICD-10-CM | POA: Diagnosis not present

## 2020-10-29 DIAGNOSIS — R634 Abnormal weight loss: Secondary | ICD-10-CM | POA: Insufficient documentation

## 2020-10-29 MED ORDER — IOHEXOL 300 MG/ML  SOLN
100.0000 mL | Freq: Once | INTRAMUSCULAR | Status: AC | PRN
  Administered 2020-10-29: 100 mL via INTRAVENOUS

## 2020-10-31 ENCOUNTER — Other Ambulatory Visit: Payer: Self-pay

## 2020-10-31 ENCOUNTER — Telehealth (INDEPENDENT_AMBULATORY_CARE_PROVIDER_SITE_OTHER): Payer: Medicare HMO | Admitting: Family Medicine

## 2020-10-31 ENCOUNTER — Encounter: Payer: Self-pay | Admitting: Family Medicine

## 2020-10-31 VITALS — Ht 66.0 in | Wt 184.2 lb

## 2020-10-31 DIAGNOSIS — R058 Other specified cough: Secondary | ICD-10-CM

## 2020-10-31 DIAGNOSIS — M797 Fibromyalgia: Secondary | ICD-10-CM

## 2020-10-31 MED ORDER — LEVOCETIRIZINE DIHYDROCHLORIDE 5 MG PO TABS: 5.0000 mg | ORAL_TABLET | Freq: Every evening | ORAL | 2 refills | Status: DC

## 2020-10-31 MED ORDER — VENLAFAXINE HCL ER 150 MG PO CP24: 150.0000 mg | ORAL_CAPSULE | Freq: Every day | ORAL | 2 refills | Status: DC

## 2020-10-31 NOTE — Progress Notes (Signed)
Chief Complaint  Patient presents with  . Follow-up    Lab work    Subjective: Patient is a 45 y.o. female here for f/u. Due to COVID-19 pandemic, we are interacting via telephone. I verified patient's ID using 2 identifiers. Patient agreed to proceed with visit via this method. Patient is at home, I am at office. Patient and I are present for visit.   Patient has gained 3 pounds since her last visit.  She is seeing the oncology team in addition to the gastroenterology team.  She is currently being treated for H. pylori by gastroenterology.  She was not having any abdominal symptoms of note.  The patient has also been dealing with fibromyalgia.  She was changed from Cymbalta 60 mg daily to Effexor 75 mg daily.  She is not having any adverse effects.  She is compliant with the medication.  She does not notice any significant changes.  She is active with her granddaughter but has no scheduled exercise.  No recent injuries or change in activity otherwise.  For the last 3 days, patient has been having a dry cough.  No upper respiratory symptoms, fevers, association with meals/position, wheezing, or shortness of breath.  She does not have any swelling.  She has tried NyQuil over the past couple nights and is not sure if it is helpful.  Past Medical History:  Diagnosis Date  . Anemia   . Anxiety   . Chronic kidney disease (CKD)    follows w nephro  . Chronic pain   . DDD (degenerative disc disease), cervical   . DDD (degenerative disc disease), lumbar   . Depression   . Menorrhagia with irregular cycle 10/17/2020  . Migraine   . MS (multiple sclerosis) ([place])   . Neuromuscular disorder ([place])    MS dx 2016  . Vertigo     Objective: Ht 5\' 6"  (1.676 m)   Wt 184 lb 4 oz (83.6 kg)   LMP 10/09/2020   BMI 29.74 kg/m  No conversational dyspnea Age appropriate judgment and insight Nml affect and mood  Assessment and Plan: Fibromyalgia - Plan: venlafaxine XR (EFFEXOR XR) 150 MG 24 hr  capsule  Dry cough - Plan: levocetirizine (XYZAL) 5 MG tablet  1.  Increase dosage of Effexor from 75 mg daily to 150 mg daily.  Counseled on exercise.  Follow-up in 6 weeks to recheck this. 2.  Start Xyzal daily.  Could be related to pollen/postnasal drainage.  If no improvement, I would like to see her in person. Clinic time: 12 minutes The patient voiced understanding and agreement to the plan.  [place], DO 10/31/20  8:53 AM

## 2020-11-01 ENCOUNTER — Telehealth: Payer: Self-pay | Admitting: Gastroenterology

## 2020-11-01 NOTE — Telephone Encounter (Signed)
Inbound call from patient. Wants a call back asking when she is to go back to the lab after finishing medication. Best contact number [phone].

## 2020-11-01 NOTE — Telephone Encounter (Signed)
Left a voicemail for the patient to contact me back regarding retesting for h. Pylori. She will need to go to the lab on 11/21/2020. Will send a my chart message as well.

## 2020-11-20 ENCOUNTER — Encounter: Payer: Medicare HMO | Admitting: Physical Medicine and Rehabilitation

## 2020-11-26 DIAGNOSIS — R634 Abnormal weight loss: Secondary | ICD-10-CM | POA: Diagnosis not present

## 2020-11-26 DIAGNOSIS — G35 Multiple sclerosis: Secondary | ICD-10-CM | POA: Diagnosis not present

## 2020-11-27 ENCOUNTER — Telehealth: Payer: Self-pay

## 2020-11-27 NOTE — Telephone Encounter (Signed)
Pt called in to r/s her 5/11 appt to next avail, pt declined a sooner appt with Ronnie Doss

## 2020-11-28 ENCOUNTER — Inpatient Hospital Stay: Payer: Medicare HMO | Admitting: Hematology & Oncology

## 2020-11-28 ENCOUNTER — Inpatient Hospital Stay: Payer: Medicare HMO

## 2020-11-29 ENCOUNTER — Ambulatory Visit: Payer: Medicare HMO | Admitting: Hematology & Oncology

## 2020-11-29 ENCOUNTER — Other Ambulatory Visit: Payer: Medicare HMO

## 2020-12-27 ENCOUNTER — Encounter: Payer: Medicare HMO | Attending: Physical Medicine and Rehabilitation | Admitting: Physical Medicine and Rehabilitation

## 2021-01-09 ENCOUNTER — Encounter: Payer: Self-pay | Admitting: Family

## 2021-01-09 ENCOUNTER — Other Ambulatory Visit: Payer: Self-pay

## 2021-01-09 ENCOUNTER — Inpatient Hospital Stay: Payer: Medicare HMO | Attending: Hematology & Oncology

## 2021-01-09 ENCOUNTER — Ambulatory Visit: Payer: Medicare HMO | Admitting: Physical Medicine and Rehabilitation

## 2021-01-09 ENCOUNTER — Inpatient Hospital Stay (HOSPITAL_BASED_OUTPATIENT_CLINIC_OR_DEPARTMENT_OTHER): Payer: Medicare HMO | Admitting: Family

## 2021-01-09 ENCOUNTER — Telehealth: Payer: Self-pay

## 2021-01-09 ENCOUNTER — Encounter: Payer: Self-pay | Admitting: *Deleted

## 2021-01-09 ENCOUNTER — Telehealth: Payer: Self-pay | Admitting: *Deleted

## 2021-01-09 VITALS — BP 123/84 | HR 77 | Temp 98.3°F | Resp 18 | Ht 66.0 in | Wt 182.0 lb

## 2021-01-09 DIAGNOSIS — D509 Iron deficiency anemia, unspecified: Secondary | ICD-10-CM | POA: Insufficient documentation

## 2021-01-09 DIAGNOSIS — D500 Iron deficiency anemia secondary to blood loss (chronic): Secondary | ICD-10-CM | POA: Diagnosis not present

## 2021-01-09 LAB — CMP (CANCER CENTER ONLY)
ALT: 16 U/L (ref 0–44)
AST: 18 U/L (ref 15–41)
Albumin: 4.1 g/dL (ref 3.5–5.0)
Alkaline Phosphatase: 60 U/L (ref 38–126)
Anion gap: 8 (ref 5–15)
BUN: 18 mg/dL (ref 6–20)
CO2: 25 mmol/L (ref 22–32)
Calcium: 9.4 mg/dL (ref 8.9–10.3)
Chloride: 106 mmol/L (ref 98–111)
Creatinine: 1.02 mg/dL — ABNORMAL HIGH (ref 0.44–1.00)
GFR, Estimated: >60 mL/min (ref >60)
Glucose, Bld: 88 mg/dL (ref 70–99)
Potassium: 4 mmol/L (ref 3.5–5.1)
Sodium: 139 mmol/L (ref 135–145)
Total Bilirubin: 0.6 mg/dL (ref 0.3–1.2)
Total Protein: 6.6 g/dL (ref 6.5–8.1)

## 2021-01-09 LAB — RETICULOCYTES
Immature Retic Fract: 14.9 % (ref 2.3–15.9)
RBC.: 5.14 MIL/uL — ABNORMAL HIGH (ref 3.87–5.11)
Retic Count, Absolute: 69.4 10*3/uL (ref 19.0–186.0)
Retic Ct Pct: 1.4 % (ref 0.4–3.1)

## 2021-01-09 LAB — CBC WITH DIFFERENTIAL (CANCER CENTER ONLY)
Abs Immature Granulocytes: 0.03 10*3/uL (ref 0.00–0.07)
Basophils Absolute: 0 10*3/uL (ref 0.0–0.1)
Basophils Relative: 1 %
Eosinophils Absolute: 0.1 10*3/uL (ref 0.0–0.5)
Eosinophils Relative: 3 %
HCT: 36 % (ref 36.0–46.0)
Hemoglobin: 10.9 g/dL — ABNORMAL LOW (ref 12.0–15.0)
Immature Granulocytes: 1 %
Lymphocytes Relative: 39 %
Lymphs Abs: 1.5 10*3/uL (ref 0.7–4.0)
MCH: 21 pg — ABNORMAL LOW (ref 26.0–34.0)
MCHC: 30.3 g/dL (ref 30.0–36.0)
MCV: 69.5 fL — ABNORMAL LOW (ref 80.0–100.0)
Monocytes Absolute: 0.4 10*3/uL (ref 0.1–1.0)
Monocytes Relative: 10 %
Neutro Abs: 1.9 10*3/uL (ref 1.7–7.7)
Neutrophils Relative %: 46 %
Platelet Count: 323 10*3/uL (ref 150–400)
RBC: 5.18 MIL/uL — ABNORMAL HIGH (ref 3.87–5.11)
RDW: 22.5 % — ABNORMAL HIGH (ref 11.5–15.5)
WBC Count: 4 10*3/uL (ref 4.0–10.5)
nRBC: 0 % (ref 0.0–0.2)

## 2021-01-09 LAB — IRON AND TIBC
Iron: 58 ug/dL (ref 41–142)
Saturation Ratios: 20 % — ABNORMAL LOW (ref 21–57)
TIBC: 288 ug/dL (ref 236–444)
UIBC: 230 ug/dL (ref 120–384)

## 2021-01-09 LAB — FERRITIN: Ferritin: 19 ng/mL (ref 11–307)

## 2021-01-09 MED ORDER — FOLIC ACID 1 MG PO TABS: 1.0000 mg | ORAL_TABLET | Freq: Every day | ORAL | 3 refills | Status: DC

## 2021-01-09 NOTE — Telephone Encounter (Signed)
Called pt with appt per pt  Andrea Briggs

## 2021-01-09 NOTE — Progress Notes (Signed)
Hematology and Oncology Follow Up Visit  JURI DINNING [id number] 1975-11-06 45 y.o. 01/09/2021   Principle Diagnosis:  Iron deficiency anemia   Current Therapy:   IV iron as indicated    Interim History:  Ms. Baley is heretoday for follow-up. She tolerated her IV iron infusions nicely but still feels fatigued.  She had an episode where she got overheated and had not eaten that day and passed out. She was assessed by EMS and states that it was felt to be due to low BP and blood glucose.  No fever, chills, n/v, cough, rash, dizziness, SOB, chest pain, palpitations, abdominal pain or changes in bowel.  No episodes of blood loss. No bruising or petechiae. No swelling or tenderness in her extremities. She has numbness and tingling in her fingertips secondary to failed carpal tunnel surgeries.  No falls to report.  She has maintained a good appetite and is staying well hydrated. Her weight is stable at 182 lbs.   ECOG Performance Status: 1 - Symptomatic but completely ambulatory  Medications:  Allergies as of 01/09/2021       Reactions   Ketorolac Other (See Comments)   Renal insufficiency   Metoprolol Swelling   Nsaids Other (See Comments)   nephropathy        Medication List        Accurate as of January 09, 2021  9:15 AM. If you have any questions, ask your nurse or doctor.          STOP taking these medications    amoxicillin 500 MG tablet Commonly known as: AMOXIL Stopped by: Laverna Peace, NP       TAKE these medications    hydrOXYzine 25 MG tablet Commonly known as: ATARAX/VISTARIL Take 25 mg by mouth 2 (two) times daily.   levocetirizine 5 MG tablet Commonly known as: XYZAL Take 1 tablet (5 mg total) by mouth every evening.   meloxicam 7.5 MG tablet Commonly known as: MOBIC Take 1 tablet (7.5 mg total) by mouth daily.   multivitamin capsule Take 1 capsule by mouth daily.   OCRELIZUMAB IV Inject 1 Dose into the vein every 6 (six) months.    omeprazole 20 MG capsule Commonly known as: PRILOSEC Take 1 capsule (20 mg total) by mouth 2 (two) times daily before a meal.   tiZANidine 2 MG tablet Commonly known as: ZANAFLEX Take by mouth 4 (four) times daily.   venlafaxine XR 150 MG 24 hr capsule Commonly known as: Effexor XR Take 1 capsule (150 mg total) by mouth daily with breakfast.   Vitamin D (Ergocalciferol) 1.25 MG (50000 UNIT) Caps capsule Commonly known as: DRISDOL Take 50,000 Units by mouth once a week.   zonisamide 100 MG capsule Commonly known as: ZONEGRAN Take 300 mg by mouth daily.        Allergies:  Allergies  Allergen Reactions   Ketorolac Other (See Comments)    Renal insufficiency   Metoprolol Swelling   Nsaids Other (See Comments)    nephropathy    Past Medical History, Surgical history, Social history, and Family History were reviewed and updated.  Review of Systems: All other 10 point review of systems is negative.   Physical Exam:  height is 5\' 6"  (1.676 m) and weight is 182 lb 0.6 oz (82.6 kg). Her oral temperature is 98.3 F (36.8 C). Her blood pressure is 123/84 and her pulse is 77. Her respiration is 18 and oxygen saturation is 100%.   Wt Readings from Last 3  Encounters:  01/09/21 182 lb 0.6 oz (82.6 kg)  10/31/20 184 lb 4 oz (83.6 kg)  10/17/20 181 lb (82.1 kg)    Ocular: Sclerae unicteric, pupils equal, round and reactive to light Ear-nose-throat: Oropharynx clear, dentition fair Lymphatic: No cervical or supraclavicular adenopathy Lungs no rales or rhonchi, good excursion bilaterally Heart regular rate and rhythm, no murmur appreciated Abd soft, nontender, positive bowel sounds MSK no focal spinal tenderness, no joint edema Neuro: non-focal, well-oriented, appropriate affect Breasts: Deferred   Lab Results  Component Value Date   WBC 4.0 01/09/2021   HGB 10.9 (L) 01/09/2021   HCT 36.0 01/09/2021   MCV 69.5 (L) 01/09/2021   PLT 323 01/09/2021   Lab Results   Component Value Date   FERRITIN 16 10/17/2020   IRON 94 10/17/2020   TIBC 295 10/17/2020   UIBC 201 10/17/2020   IRONPCTSAT 32 10/17/2020   Lab Results  Component Value Date   RETICCTPCT 1.4 01/09/2021   RBC 5.18 (H) 01/09/2021   No results found for: KPAFRELGTCHN, LAMBDASER, KAPLAMBRATIO No results found for: IGGSERUM, IGA, IGMSERUM No results found for: Odetta Pink, SPEI   Chemistry      Component Value Date/Time   NA 139 01/09/2021 0822   K 4.0 01/09/2021 0822   CL 106 01/09/2021 0822   CO2 25 01/09/2021 0822   BUN 18 01/09/2021 0822   CREATININE 1.02 (H) 01/09/2021 0822      Component Value Date/Time   CALCIUM 9.4 01/09/2021 0822   ALKPHOS 60 01/09/2021 0822   AST 18 01/09/2021 0822   ALT 16 01/09/2021 0822   BILITOT 0.6 01/09/2021 1157       Impression and Plan: Ms. Whitehair is a very pleasant 45 yo African American female with history of iron deficiency anemia with history of GI blood loss.  Iron studies are pending. We will replace if needed.  We will have her start folic acid PO daily for microcytic anemia, MCV 69.   She follows up with GI Dr. Bryan Lemma on 01/17/21.  Follow-up in another 8 weeks.  She can contact our office with any questions or concerns.   Laverna Peace, NP 6/22/20229:15 AM

## 2021-01-09 NOTE — Telephone Encounter (Signed)
Per 01/09/21 los - called and gave patient upcoming appointments - patient confirmed

## 2021-01-10 ENCOUNTER — Encounter: Payer: Self-pay | Admitting: Family

## 2021-01-17 ENCOUNTER — Inpatient Hospital Stay: Payer: Medicare HMO

## 2021-01-17 ENCOUNTER — Ambulatory Visit (INDEPENDENT_AMBULATORY_CARE_PROVIDER_SITE_OTHER): Payer: Medicare HMO | Admitting: Gastroenterology

## 2021-01-17 ENCOUNTER — Encounter: Payer: Self-pay | Admitting: Gastroenterology

## 2021-01-17 ENCOUNTER — Other Ambulatory Visit: Payer: Self-pay

## 2021-01-17 VITALS — BP 122/76 | HR 85 | Ht 66.0 in | Wt 183.5 lb

## 2021-01-17 VITALS — BP 114/64 | HR 88 | Temp 98.2°F

## 2021-01-17 DIAGNOSIS — D509 Iron deficiency anemia, unspecified: Secondary | ICD-10-CM | POA: Diagnosis not present

## 2021-01-17 DIAGNOSIS — A048 Other specified bacterial intestinal infections: Secondary | ICD-10-CM

## 2021-01-17 DIAGNOSIS — N921 Excessive and frequent menstruation with irregular cycle: Secondary | ICD-10-CM

## 2021-01-17 DIAGNOSIS — D500 Iron deficiency anemia secondary to blood loss (chronic): Secondary | ICD-10-CM

## 2021-01-17 DIAGNOSIS — K5900 Constipation, unspecified: Secondary | ICD-10-CM

## 2021-01-17 MED ORDER — SODIUM CHLORIDE 0.9 % IV SOLN
Freq: Once | INTRAVENOUS | Status: AC
  Filled 2021-01-17: qty 250

## 2021-01-17 MED ORDER — SODIUM CHLORIDE 0.9 % IV SOLN
200.0000 mg | Freq: Once | INTRAVENOUS | Status: AC
  Administered 2021-01-17: 200 mg via INTRAVENOUS
  Filled 2021-01-17: qty 200

## 2021-01-17 NOTE — Patient Instructions (Signed)

## 2021-01-17 NOTE — Progress Notes (Signed)
Chief Complaint:    Iron deficiency anemia  GI History: 45 year old female with history of CKD, DDD, multiple sclerosis, fibromyalgia, initially seen in GI clinic in 02/2020 for evaluation of iron deficiency anemia and FOBT positive stool.  She additionally reported early satiety and decreased appetite, which had been ongoing since her diagnosis of MS in 2016.  CT head in 01/2020 unremarkable.   -Baseline Hgb ~12  from 2016-2020 -07/2019: H/H 11.8/36.8, MCV/RDW 75.4/15.8. Ferritin 6.1, iron 22, sat 6.7% and FOBT+ in 08/2019. Was told to start on ferrous sulfate (never started).   -12/2019: H/H 9.4/30.2, MCV/RDW 68.3/17.2.   -EGD (04/2020): Gastritis with 3 shallow antral ulcers (path: H. pylori gastritis), 4 mm DU (path:), Otherwise normal duodenum (path negative for Celiac).  Treated with quadruple therapy -Colonoscopy (04/2020): 4 mm cecal polyp (path: Benign lymphoid aggregate), diverticulosis, normal TI.  Repeat in 10 years -09/2020: H/H 9.1/28.7, MCV/RDW 57.6/22.8.  Ferritin 7.6, iron 22, sat 6.2% (still not taking oral iron).  Normal CMP, B12 - 09/2020: H. pylori stool antigen positive.  Treated with second course of quadruple therapy (clarithromycin-based concomitant therapy not covered) - 10/2020: CT abdomen/pelvis: Hepatic angioma, unchanged mild fullness in the pancreatic head (normal variation), diverticulosis - 10/2020: IV iron - 12/2020: H/H 10.9/36, MCV/RDW 69.5/22.5.  Ferritin 19, iron 58, TIBC 288, sat 20%  HPI:     Patient is a 45 y.o. female presenting to the Gastroenterology Clinic for follow-up.  Last seen by me on 10/16/2020 for evaluation of IDA and weight loss.  Stool testing was positive for H. pylori (previously treated in 04/2020 with quadruple therapy), and has since completed second course of antimicrobial therapy in 10/2020.  Has not completed repeat stool testing to confirm eradication.  Previously placed referral to Hematology for IV iron given intolerance to oral iron along  with evaluation of unintentional weight loss.  CT ordered for further weight loss as well and unremarkable as above.  Was seen by Dr. Marin Olp in the Hematology clinic on 10/17/2020 and given IV iron in April with improved H/H and iron indices earlier this month.  She followed up with Dr. Nani Ravens on 10/31/2020.  Was starting to gain weight at that time.  Medications are being adjusted for fibromyalgia as well.  Today, she states appetite good, eating 4 small meals/day. Weight up 2# since last appt.  Has 2nd IV iron infusion today.  Does feel some increased energy since starting IV iron.  Her only complaint today is constipation over the last 2 days.  Has had straining to have BM with BRB on tissue paper.   Review of systems:     No chest pain, no SOB, no fevers, no urinary sx   Past Medical History:  Diagnosis Date   Anemia    Anxiety    Chronic kidney disease (CKD)    follows w nephro   Chronic pain    DDD (degenerative disc disease), cervical    DDD (degenerative disc disease), lumbar    Depression    Menorrhagia with irregular cycle 10/17/2020   Migraine    MS (multiple sclerosis) (HCC)    Neuromuscular disorder ([place])    MS dx 2016   Vertigo     Patient's surgical history, family medical history, social history, medications and allergies were all reviewed in Epic    Current Outpatient Medications  Medication Sig Dispense Refill   folic acid (FOLVITE) 1 MG tablet Take 1 tablet (1 mg total) by mouth daily. 90 tablet 3  hydrOXYzine (ATARAX/VISTARIL) 25 MG tablet Take 25 mg by mouth 2 (two) times daily.     levocetirizine (XYZAL) 5 MG tablet Take 1 tablet (5 mg total) by mouth every evening. 30 tablet 2   meloxicam (MOBIC) 7.5 MG tablet Take 1 tablet (7.5 mg total) by mouth daily. 30 tablet 2   Multiple Vitamin (MULTIVITAMIN) capsule Take 1 capsule by mouth daily.     OCRELIZUMAB IV Inject 1 Dose into the vein every 6 (six) months.     omeprazole (PRILOSEC) 20 MG capsule Take 1  capsule (20 mg total) by mouth 2 (two) times daily before a meal. 14 capsule 0   tiZANidine (ZANAFLEX) 2 MG tablet Take by mouth 4 (four) times daily.     venlafaxine XR (EFFEXOR XR) 150 MG 24 hr capsule Take 1 capsule (150 mg total) by mouth daily with breakfast. 30 capsule 2   Vitamin D, Ergocalciferol, (DRISDOL) 1.25 MG (50000 UNIT) CAPS capsule Take 50,000 Units by mouth once a week.     zonisamide (ZONEGRAN) 100 MG capsule Take 300 mg by mouth daily.     Current Facility-Administered Medications  Medication Dose Route Frequency Provider Last Rate Last Admin   0.9 %  sodium chloride infusion  500 mL Intravenous Continuous Amiayah Giebel V, DO        Physical Exam:     BP 122/76   Pulse 85   Ht 5\' 6"  (1.676 m)   Wt 183 lb 8 oz (83.2 kg)   SpO2 98%   BMI 29.62 kg/m   GENERAL:  Pleasant female in NAD NEURO: Alert and oriented x 3, no focal neurologic deficits   IMPRESSION and PLAN:    1) H. pylori gastritis - Has completed second course of antimicrobial therapy - Check H. pylori stool antigen to confirm eradication (has been off PPI for the last 4+ weeks)  2) Constipation - New onset constipation over the last 2 days with associated scant hematochezia - Start MiraLAX 1 cap/day for goal of soft stools without straining to have BM - Increase water intake to at least 64 ounces/day - If symptoms do not abate, can plan for further work-up - Once symptoms improve, encouraged high-fiber diet with fiber supplement as needed to maintain soft stools - Suspected hemorrhoidal symptoms worsen, discussed OTC conservative management vs hemorrhoid banding  3) Iron deficiency anemia - Has IV iron dose #2 today - Continues to follow in the Hematology clinic  I spent 30 minutes of time, including in depth chart review, independent review of results as outlined above, communicating results with the patient directly, face-to-face time with the patient, coordinating care, and ordering  studies and medications as appropriate, and documentation.       [place] ,DO, FACG 01/17/2021, 8:21 AM

## 2021-01-17 NOTE — Patient Instructions (Signed)
If you are age 46 or older, your body mass index should be between 23-30. Your Body mass index is 29.62 kg/m. If this is out of the aforementioned range listed, please consider follow up with your Primary Care Provider.  If you are age 59 or younger, your body mass index should be between 19-25. Your Body mass index is 29.62 kg/m. If this is out of the aformentioned range listed, please consider follow up with your Primary Care Provider.   Take Miralax 1 capful mixed in 8 ounces of water at bed time for constipation as tolerated.  Increase water intake to 64 ounces daily.  Please go to the 2nd floor of this building today and schedule your labwork, [place], [address].  Due to recent changes in healthcare laws, you may see the results of your imaging and laboratory studies on MyChart before your provider has had a chance to review them.  We understand that in some cases there may be results that are confusing or concerning to you. Not all laboratory results come back in the same time frame and the provider may be waiting for multiple results in order to interpret others.  Please give [place] 48 hours in order for your provider to thoroughly review all the results before contacting the office for clarification of your results.   Thank you for choosing me and [facility].  Vito Cirigliano, D.O.

## 2021-01-23 ENCOUNTER — Other Ambulatory Visit (INDEPENDENT_AMBULATORY_CARE_PROVIDER_SITE_OTHER): Payer: Medicare HMO

## 2021-01-23 ENCOUNTER — Other Ambulatory Visit: Payer: Self-pay

## 2021-01-23 DIAGNOSIS — A048 Other specified bacterial intestinal infections: Secondary | ICD-10-CM

## 2021-01-24 LAB — HELICOBACTER PYLORI  SPECIAL ANTIGEN
MICRO NUMBER:: 12086739
SPECIMEN QUALITY: ADEQUATE

## 2021-01-30 ENCOUNTER — Telehealth: Payer: Self-pay | Admitting: Family Medicine

## 2021-01-30 DIAGNOSIS — N3281 Overactive bladder: Secondary | ICD-10-CM

## 2021-01-30 NOTE — Telephone Encounter (Signed)
Patient would like a refill on [place], [place] Phone:  [phone]  Fax:  [phone]

## 2021-01-31 MED ORDER — OXYBUTYNIN CHLORIDE ER 10 MG PO TB24: 10.0000 mg | ORAL_TABLET | Freq: Every day | ORAL | 2 refills | Status: DC

## 2021-01-31 NOTE — Telephone Encounter (Signed)
Patient states medication was prescribed by Dr.White in last year 04/2020 to be exact for over-active bladder and states she no longer sees Dr.White since you have became her PCP

## 2021-01-31 NOTE — Telephone Encounter (Signed)
Medication not on patient list , okay to refill .

## 2021-01-31 NOTE — Telephone Encounter (Signed)
Rx sent 

## 2021-02-04 ENCOUNTER — Other Ambulatory Visit: Payer: Self-pay

## 2021-02-04 ENCOUNTER — Telehealth: Payer: Self-pay

## 2021-02-04 ENCOUNTER — Encounter: Payer: Self-pay | Admitting: Family Medicine

## 2021-02-04 ENCOUNTER — Telehealth (INDEPENDENT_AMBULATORY_CARE_PROVIDER_SITE_OTHER): Payer: Medicare HMO | Admitting: Family Medicine

## 2021-02-04 DIAGNOSIS — B373 Candidiasis of vulva and vagina: Secondary | ICD-10-CM | POA: Diagnosis not present

## 2021-02-04 DIAGNOSIS — G35 Multiple sclerosis: Secondary | ICD-10-CM

## 2021-02-04 DIAGNOSIS — H539 Unspecified visual disturbance: Secondary | ICD-10-CM

## 2021-02-04 DIAGNOSIS — R55 Syncope and collapse: Secondary | ICD-10-CM | POA: Diagnosis not present

## 2021-02-04 DIAGNOSIS — B3731 Acute candidiasis of vulva and vagina: Secondary | ICD-10-CM

## 2021-02-04 MED ORDER — FLUCONAZOLE 150 MG PO TABS: ORAL_TABLET | ORAL | 0 refills | Status: DC

## 2021-02-04 NOTE — Telephone Encounter (Signed)
I can see her later today around 75 e visit also. Ty.

## 2021-02-04 NOTE — Telephone Encounter (Signed)
Pt has called and stated that she woke up late last night and early this am with vaginal itching. She knows her body and since the MS dx she has been having the yeast infections more frequently. She feels this is the reason for the vaginal itching. Pt is now scheduled for an in person visit on Friday 02/08/21 @ 11am.   Pt is asking if something can possibly be called in.   Thanks, [employer]

## 2021-02-04 NOTE — Telephone Encounter (Signed)
Called the patient and she did agree to a VV today at 4:15.

## 2021-02-04 NOTE — Progress Notes (Signed)
Chief Complaint  Patient presents with   Vaginitis    Andrea Briggs is a 45 y.o. female here for vaginal irritation.  Duration: 1 day Description of discharge: no d/c Odor: No New sexual partner: No Urinary complaints: No IUD? No Denies fevers, bleeding, pregnancy abdominal pain.   Syncope Passed out a few d ago. This happened also in Oct 2021. She felt hot just prior. Unsure if she ate that morning. She does stay hydrated. Has hx of Fe def anemia, gets iron infusions thru heme/onc. That has been stable. No known cardiac hx.   Past Medical History:  Diagnosis Date   Anemia    Anxiety    Chronic kidney disease (CKD)    follows w nephro   Chronic pain    DDD (degenerative disc disease), cervical    DDD (degenerative disc disease), lumbar    Depression    Menorrhagia with irregular cycle 10/17/2020   Migraine    MS (multiple sclerosis) ([place])    Neuromuscular disorder ([place])    MS dx 2016   Vertigo    Family History  Problem Relation Age of Onset   Cancer Maternal Grandmother        breast cancer   Stroke Maternal Grandfather    Colon cancer Neg Hx    Esophageal cancer Neg Hx    Rectal cancer Neg Hx    Stomach cancer Neg Hx    Objective No conversational dyspnea Age appropriate judgment and insight Nml affect and mood  Yeast vaginitis - Plan: fluconazole (DIFLUCAN) 150 MG tablet  Syncope, unspecified syncope type  MS (multiple sclerosis) ([place]) - Plan: Ambulatory referral to Ophthalmology  Vision changes - Plan: Ambulatory referral to Ophthalmology  Diflucan. Offered referral to cards. She thinks it might be related to low sugar. Will monitor. She will let me know if anything changes or if she has a change of heart. 3/4. Refer ophtho.  F/u prn. Pt voiced understanding and agreement to the plan.  [place], DO 02/04/21 3:40 PM

## 2021-02-05 ENCOUNTER — Ambulatory Visit: Payer: Medicare HMO | Admitting: Family Medicine

## 2021-02-08 ENCOUNTER — Telehealth: Payer: Self-pay

## 2021-02-08 ENCOUNTER — Ambulatory Visit: Payer: Medicare HMO | Admitting: Family Medicine

## 2021-02-08 NOTE — Telephone Encounter (Signed)
OK 

## 2021-02-08 NOTE — Telephone Encounter (Signed)
[facility] pharmacy called stating patient wants PCP to take over prescribing these medications:   (ATARAX/VISTARIL) 25 MG    tiZANidine (ZANAFLEX) 2

## 2021-02-11 MED ORDER — HYDROXYZINE HCL 25 MG PO TABS: 25.0000 mg | ORAL_TABLET | Freq: Two times a day (BID) | ORAL | 0 refills | Status: DC

## 2021-02-11 MED ORDER — TIZANIDINE HCL 2 MG PO TABS: 2.0000 mg | ORAL_TABLET | Freq: Four times a day (QID) | ORAL | 0 refills | Status: DC

## 2021-02-11 NOTE — Telephone Encounter (Signed)
sent 

## 2021-02-14 NOTE — Progress Notes (Deleted)
[place] at [facility] [address], [place], [place] [address] [phone]  Date:  02/15/2021   Name:  Andrea Briggs   DOB:  [birth year]   MRN:  [id number]  PCP:  Shelda Pal, DO    Chief Complaint: No chief complaint on file.   History of Present Illness:  Andrea Briggs is a 45 y.o. very pleasant female patient who presents with the following:  Pt seen today with concern of vaginal itching Primary pt of Dr Rosita Kea  I have not sen her myself in the past History of MS, iron def anemia, chronic back pain   She did a virtual visit with Merrilee Seashore 10 days ago for some concern, rx diflucan     Patient Active Problem List   Diagnosis Date Noted   Iron deficiency anemia due to chronic blood loss 10/17/2020   Menorrhagia with irregular cycle 10/17/2020   Fever and chills 07/30/2020   Weakness of both lower extremities 04/30/2018   Greater trochanteric bursitis of left hip 03/10/2018   Aortic atherosclerosis ([place]) 11/12/2017   Fibromyalgia 08/13/2017   MS (multiple sclerosis) (HCC)    DDD (degenerative disc disease), cervical    DDD (degenerative disc disease), lumbar    Chronic kidney disease (CKD)    Chronic pain    Carpal tunnel syndrome of left wrist 09/16/2013    Past Medical History:  Diagnosis Date   Anemia    Anxiety    Chronic kidney disease (CKD)    follows w nephro   Chronic pain    DDD (degenerative disc disease), cervical    DDD (degenerative disc disease), lumbar    Depression    Menorrhagia with irregular cycle 10/17/2020   Migraine    MS (multiple sclerosis) (HCC)    Neuromuscular disorder ([place])    MS dx 2016   Vertigo     Past Surgical History:  Procedure Laterality Date   carpel tunnel     HERNIA REPAIR      Social History   Tobacco Use   Smoking status: Never   Smokeless tobacco: Never  Vaping Use   Vaping Use: Never used  Substance Use Topics   Alcohol use: No   Drug  use: No    Family History  Problem Relation Age of Onset   Cancer Maternal Grandmother        breast cancer   Stroke Maternal Grandfather    Colon cancer Neg Hx    Esophageal cancer Neg Hx    Rectal cancer Neg Hx    Stomach cancer Neg Hx     Allergies  Allergen Reactions   Ketorolac Other (See Comments)    Renal insufficiency   Metoprolol Swelling   Nsaids Other (See Comments)    nephropathy    Medication list has been reviewed and updated.  Current Outpatient Medications on File Prior to Visit  Medication Sig Dispense Refill   fluconazole (DIFLUCAN) 150 MG tablet Take 1 tab, repeat in 48 hours if no improvement. 2 tablet 0   folic acid (FOLVITE) 1 MG tablet Take 1 tablet (1 mg total) by mouth daily. 90 tablet 3   hydrOXYzine (ATARAX/VISTARIL) 25 MG tablet Take 1 tablet (25 mg total) by mouth 2 (two) times daily. 30 tablet 0   levocetirizine (XYZAL) 5 MG tablet Take 1 tablet (5 mg total) by mouth every evening. 30 tablet 2   meloxicam (MOBIC) 7.5 MG tablet Take 1 tablet (  7.5 mg total) by mouth daily. 30 tablet 2   Multiple Vitamin (MULTIVITAMIN) capsule Take 1 capsule by mouth daily.     OCRELIZUMAB IV Inject 1 Dose into the vein every 6 (six) months.     omeprazole (PRILOSEC) 20 MG capsule Take 1 capsule (20 mg total) by mouth 2 (two) times daily before a meal. 14 capsule 0   oxybutynin (DITROPAN XL) 10 MG 24 hr tablet Take 1 tablet (10 mg total) by mouth at bedtime. 30 tablet 2   tiZANidine (ZANAFLEX) 2 MG tablet Take 1 tablet (2 mg total) by mouth 4 (four) times daily. 30 tablet 0   venlafaxine XR (EFFEXOR XR) 150 MG 24 hr capsule Take 1 capsule (150 mg total) by mouth daily with breakfast. 30 capsule 2   Vitamin D, Ergocalciferol, (DRISDOL) 1.25 MG (50000 UNIT) CAPS capsule Take 50,000 Units by mouth once a week.     zonisamide (ZONEGRAN) 100 MG capsule Take 300 mg by mouth daily.     Current Facility-Administered Medications on File Prior to Visit  Medication Dose Route  Frequency Provider Last Rate Last Admin   0.9 %  sodium chloride infusion  500 mL Intravenous Continuous Cirigliano, Vito V, DO        Review of Systems:  As per HPI- otherwise negative.   Physical Examination: There were no vitals filed for this visit. There were no vitals filed for this visit. There is no height or weight on file to calculate BMI. Ideal Body Weight:    GEN: no acute distress. HEENT: Atraumatic, Normocephalic.  Ears and Nose: No external deformity. CV: RRR, No M/G/R. No JVD. No thrill. No extra heart sounds. PULM: CTA B, no wheezes, crackles, rhonchi. No retractions. No resp. distress. No accessory muscle use. ABD: S, NT, ND, +BS. No rebound. No HSM. EXTR: No c/c/e PSYCH: Normally interactive. Conversant.    Assessment and Plan: ***  Signed Lamar Blinks, MD

## 2021-02-15 ENCOUNTER — Ambulatory Visit: Payer: Medicare HMO | Admitting: Family Medicine

## 2021-02-26 ENCOUNTER — Encounter: Payer: Self-pay | Admitting: Family Medicine

## 2021-02-26 ENCOUNTER — Other Ambulatory Visit: Payer: Self-pay

## 2021-02-26 ENCOUNTER — Ambulatory Visit (INDEPENDENT_AMBULATORY_CARE_PROVIDER_SITE_OTHER): Payer: Medicare HMO | Admitting: Family Medicine

## 2021-02-26 VITALS — BP 110/70 | HR 76 | Temp 98.2°F | Ht 66.0 in | Wt 185.5 lb

## 2021-02-26 DIAGNOSIS — N898 Other specified noninflammatory disorders of vagina: Secondary | ICD-10-CM

## 2021-02-26 DIAGNOSIS — M25531 Pain in right wrist: Secondary | ICD-10-CM

## 2021-02-26 MED ORDER — CLOTRIMAZOLE-BETAMETHASONE 1-0.05 % EX CREA: 1.0000 "application " | TOPICAL_CREAM | Freq: Two times a day (BID) | CUTANEOUS | 0 refills | Status: DC

## 2021-02-26 MED ORDER — HYDROCODONE-ACETAMINOPHEN 5-325 MG PO TABS
1.0000 | ORAL_TABLET | Freq: Four times a day (QID) | ORAL | 0 refills | Status: DC | PRN
Start: 2021-02-26 — End: 2021-11-01

## 2021-02-26 NOTE — Patient Instructions (Addendum)
Ice/cold pack over area for 10-15 min twice daily.  Heat (pad or rice pillow in microwave) over affected area, 10-15 minutes twice daily.   OK to take Tylenol as needed.  Do not drink alcohol, do any illicit/street drugs, drive or do anything that requires alertness while on the Norco.   Let us know if you need anything.  Wrist and Forearm Exercises Do exercises exactly as told by your health care provider and adjust them as directed. It is normal to feel mild stretching, pulling, tightness, or discomfort as you do these exercises, but you should stop right away if you feel sudden pain or your pain gets worse.   RANGE OF MOTION EXERCISES These exercises warm up your muscles and joints and improve the movement and flexibility of your injured wrist and forearm. These exercises also help to relieve pain, numbness, and tingling. These exercises are done using the muscles in your injured wrist and forearm. Exercise A: Wrist Flexion, Active With your fingers relaxed, bend your wrist forward as far as you can. Hold this position for 30 seconds. Repeat 2 times. Complete this exercise 3 times per week. Exercise B: Wrist Extension, Active With your fingers relaxed, bend your wrist backward as far as you can. Hold this position for 30 seconds. Repeat 2 times. Complete this exercise 3 times per week. Exercise C: Supination, Active  Stand or sit with your arms at your sides. Bend your left / right elbow to an "L" shape (90 degrees). Turn your palm upward until you feel a gentle stretch on the inside of your forearm. Hold this position for 30 seconds. Slowly return your palm to the starting position. Repeat 2 times. Complete this exercise 3 times per week. Exercise D: Pronation, Active  Stand or sit with your arms at your sides. Bend your left / right elbow to an "L" shape (90 degrees). Turn your palm downward until you feel a gentle stretch on the top of your forearm. Hold this position for 30  seconds. Slowly return your palm to the starting position. Repeat 2 times. Complete this exercise once a day.  STRETCHING EXERCISES These exercises warm up your muscles and joints and improve the movement and flexibility of your injured wrist and forearm. These exercises also help to relieve pain, numbness, and tingling. These exercises are done using your healthy wrist and forearm to help stretch the muscles in your injured wrist and forearm. Exercise E: Wrist Flexion, Passive  Extend your left / right arm in front of you, relax your wrist, and point your fingers downward. Gently push on the back of your hand. Stop when you feel a gentle stretch on the top of your forearm. Hold this position for 30 seconds. Repeat 2 times. Complete this exercise 3 times per week. Exercise F: Wrist Extension, Passive  Extend your left / right arm in front of you and turn your palm upward. Gently pull your palm and fingertips back so your fingers point downward. You should feel a gentle stretch on the palm-side of your forearm. Hold this position for 30 seconds. Repeat 2 times. Complete this exercise 3 times per week. Exercise G: Forearm Rotation, Supination, Passive Sit with your left / right elbow bent to an "L" shape (90 degrees) with your forearm resting on a table. Keeping your upper body and shoulder still, use your other hand to rotate your forearm palm-up until you feel a gentle to moderate stretch. Hold this position for 30 seconds. Slowly release the stretch and  return to the starting position. Repeat 2 times. Complete this exercise 3 times per week. Exercise H: Forearm Rotation, Pronation, Passive Sit with your left / right elbow bent to an "L" shape (90 degrees) with your forearm resting on a table. Keeping your upper body and shoulder still, use your other hand to rotate your forearm palm-down until you feel a gentle to moderate stretch. Hold this position for 30 seconds. Slowly release the  stretch and return to the starting position. Repeat 2 times. Complete this exercise 3 times per week.  STRENGTHENING EXERCISES These exercises build strength and endurance in your wrist and forearm. Endurance is the ability to use your muscles for a long time, even after they get tired. Exercise I: Wrist Flexors  Sit with your left / right forearm supported on a table and your hand resting palm-up over the edge of the table. Your elbow should be bent to an "L" shape (about 90 degrees) and be below the level of your shoulder. Hold a 3-5 lb weight in your left / right hand. Or, hold a rubber exercise band or tube in both hands, keeping your hands at the same level and hip distance apart. There should be a slight tension in the exercise band or tube. Slowly curl your hand up toward your forearm. Hold this position for 3 seconds. Slowly lower your hand back to the starting position. Repeat 2 times. Complete this exercise 3 times per week. Exercise J: Wrist Extensors  Sit with your left / right forearm supported on a table and your hand resting palm-down over the edge of the table. Your elbow should be bent to an "L" shape (about 90 degrees) and be below the level of your shoulder. Hold a 3-5 lb weight in your left / right hand. Or, hold a rubber exercise band or tube in both hands, keeping your hands at the same level and hip distance apart. There should be a slight tension in the exercise band or tube. Slowly curl your hand up toward your forearm. Hold this position for 3 seconds. Slowly lower your hand back to the starting position. Repeat 2 times. Complete this exercise 3 times per week. Exercise K: Forearm Rotation, Supination  Sit with your left / right forearm supported on a table and your hand resting palm-down. Your elbow should be at your side, bent to an "L" shape (about 90 degrees), and below the level of your shoulder. Keep your wrist stable and in a neutral position throughout the  exercise. Gently hold a lightweight hammer with your left / right hand. Without moving your elbow or wrist, slowly rotate your palm upward to a thumbs-up position. Hold this position for 3 seconds. Slowly return your forearm to the starting position. Repeat 2 times. Complete this exercise 3 times per week. Exercise L: Forearm Rotation, Pronation  Sit with your left / right forearm supported on a table and your hand resting palm-up. Your elbow should be at your side, bent to an "L" shape (about 90 degrees), and below the level of your shoulder. Keep your wrist stable. Do not allow it to move backward or forward during the exercise. Gently hold a lightweight hammer with your left / right hand. Without moving your elbow or wrist, slowly rotate your palm and hand upward to a thumbs-up position. Hold this position for 3 seconds. Slowly return your forearm to the starting position. Repeat 2 times. Complete this exercise 3 times per week. Exercise M: Grip Strengthening  Hold one  of these items in your left / right hand: play dough, therapy putty, a dense sponge, a stress ball, or a large, rolled sock. Squeeze as hard as you can without increasing pain. Hold this position for 5 seconds. Slowly release your grip. Repeat 2 times. Complete this exercise 3 times per week.  This information is not intended to replace advice given to you by your health care provider. Make sure you discuss any questions you have with your health care provider. Document Released: 05/21/2005 Document Revised: 03/31/2016 Document Reviewed: 04/01/2015 Elsevier Interactive Patient Education  [employer].

## 2021-02-26 NOTE — Progress Notes (Signed)
Musculoskeletal Exam  Patient: Andrea Briggs DOB: 30-Nov-1975  DOS: 02/26/2021  SUBJECTIVE:  Chief Complaint:   Chief Complaint  Patient presents with   Arm Pain    Right arm pain Wrist and hand pain (right) Vaginal itch     Andrea Briggs is a 45 y.o.  female for evaluation and treatment of R hand/wrist pain.   Onset:  2 days ago. [place] in living room.  Location:  R wrist/hand Character:  aching and sharp  Progression of issue:  has significantly improved Associated symptoms: hurts with movement No decreased ROM, redness, bruising Treatment: to date has been acetaminophen.   Neurovascular symptoms: no  Vag itching  A few days of itching on the outside of her vaginal region. Does not feel like a vag yeast infection. No new topicals or close contacts w similar issues.   Past Medical History:  Diagnosis Date   Anemia    Anxiety    Chronic kidney disease (CKD)    follows w nephro   Chronic pain    DDD (degenerative disc disease), cervical    DDD (degenerative disc disease), lumbar    Depression    Menorrhagia with irregular cycle 10/17/2020   Migraine    MS (multiple sclerosis) ([place])    Neuromuscular disorder ([place])    MS dx 2016   Vertigo     Objective: VITAL SIGNS: BP 110/70   Pulse 76   Temp 98.2 F (36.8 C) (Oral)   Ht '5\' 6"'$  (1.676 m)   Wt 185 lb 8 oz (84.1 kg)   SpO2 99%   BMI 29.94 kg/m  Constitutional: Well formed, well developed. No acute distress. Thorax & Lungs: No accessory muscle use Musculoskeletal: R wrist.   Normal active range of motion: yes.   Normal passive range of motion: yes Tenderness to palpation: yes; over flexor and extensor tendons; no ttp over snuff box or bony ttp Deformity: no Ecchymosis: no Neurologic: Normal sensory function.  Psychiatric: Normal mood. Age appropriate judgment and insight. Alert & oriented x 3.    Assessment:  Right wrist pain - Plan: HYDROcodone-acetaminophen (NORCO/VICODIN) 5-325 MG  tablet  Vaginal irritation - Plan: clotrimazole-betamethasone (LOTRISONE) cream  Plan: Stretches/exercises, heat, ice, Tylenol. Hx of FM, pain out of proportion to exam likely to this dx. Offered XR, declined as she is moving well. Short course of hydrocodone. Bracing.  Will cover for yeast and eczema w above.  F/u prn. The patient voiced understanding and agreement to the plan.  [place], DO 02/26/21  7:53 AM

## 2021-03-11 ENCOUNTER — Inpatient Hospital Stay: Payer: Medicare HMO

## 2021-03-11 ENCOUNTER — Inpatient Hospital Stay: Payer: Medicare HMO | Admitting: Family

## 2021-03-14 ENCOUNTER — Telehealth: Payer: Self-pay | Admitting: *Deleted

## 2021-03-14 ENCOUNTER — Inpatient Hospital Stay: Payer: Medicare HMO | Attending: Hematology & Oncology

## 2021-03-14 ENCOUNTER — Encounter: Payer: Self-pay | Admitting: Family

## 2021-03-14 ENCOUNTER — Inpatient Hospital Stay (HOSPITAL_BASED_OUTPATIENT_CLINIC_OR_DEPARTMENT_OTHER): Payer: Medicare HMO | Admitting: Family

## 2021-03-14 ENCOUNTER — Other Ambulatory Visit: Payer: Self-pay

## 2021-03-14 VITALS — BP 99/72 | HR 64 | Temp 98.2°F | Resp 17 | Wt 186.1 lb

## 2021-03-14 DIAGNOSIS — D500 Iron deficiency anemia secondary to blood loss (chronic): Secondary | ICD-10-CM | POA: Diagnosis not present

## 2021-03-14 DIAGNOSIS — D509 Iron deficiency anemia, unspecified: Secondary | ICD-10-CM | POA: Diagnosis not present

## 2021-03-14 DIAGNOSIS — N921 Excessive and frequent menstruation with irregular cycle: Secondary | ICD-10-CM | POA: Diagnosis not present

## 2021-03-14 LAB — RETICULOCYTES
Immature Retic Fract: 4.2 % (ref 2.3–15.9)
RBC.: 5.5 MIL/uL — ABNORMAL HIGH (ref 3.87–5.11)
Retic Count, Absolute: 47.3 10*3/uL (ref 19.0–186.0)
Retic Ct Pct: 0.9 % (ref 0.4–3.1)

## 2021-03-14 LAB — CBC WITH DIFFERENTIAL (CANCER CENTER ONLY)
Abs Immature Granulocytes: 0.01 10*3/uL (ref 0.00–0.07)
Basophils Absolute: 0 10*3/uL (ref 0.0–0.1)
Basophils Relative: 1 %
Eosinophils Absolute: 0.3 10*3/uL (ref 0.0–0.5)
Eosinophils Relative: 8 %
HCT: 39.9 % (ref 36.0–46.0)
Hemoglobin: 12.4 g/dL (ref 12.0–15.0)
Immature Granulocytes: 0 %
Lymphocytes Relative: 42 %
Lymphs Abs: 1.8 10*3/uL (ref 0.7–4.0)
MCH: 22.4 pg — ABNORMAL LOW (ref 26.0–34.0)
MCHC: 31.1 g/dL (ref 30.0–36.0)
MCV: 72.2 fL — ABNORMAL LOW (ref 80.0–100.0)
Monocytes Absolute: 0.5 10*3/uL (ref 0.1–1.0)
Monocytes Relative: 11 %
Neutro Abs: 1.6 10*3/uL — ABNORMAL LOW (ref 1.7–7.7)
Neutrophils Relative %: 38 %
Platelet Count: 254 10*3/uL (ref 150–400)
RBC: 5.53 MIL/uL — ABNORMAL HIGH (ref 3.87–5.11)
RDW: 20.5 % — ABNORMAL HIGH (ref 11.5–15.5)
WBC Count: 4.3 10*3/uL (ref 4.0–10.5)
nRBC: 0 % (ref 0.0–0.2)

## 2021-03-14 LAB — CMP (CANCER CENTER ONLY)
ALT: 10 U/L (ref 0–44)
AST: 14 U/L — ABNORMAL LOW (ref 15–41)
Albumin: 4.2 g/dL (ref 3.5–5.0)
Alkaline Phosphatase: 62 U/L (ref 38–126)
Anion gap: 8 (ref 5–15)
BUN: 13 mg/dL (ref 6–20)
CO2: 24 mmol/L (ref 22–32)
Calcium: 9 mg/dL (ref 8.9–10.3)
Chloride: 106 mmol/L (ref 98–111)
Creatinine: 1.05 mg/dL — ABNORMAL HIGH (ref 0.44–1.00)
GFR, Estimated: >60 mL/min (ref >60)
Glucose, Bld: 91 mg/dL (ref 70–99)
Potassium: 4.1 mmol/L (ref 3.5–5.1)
Sodium: 138 mmol/L (ref 135–145)
Total Bilirubin: 0.8 mg/dL (ref 0.3–1.2)
Total Protein: 6.9 g/dL (ref 6.5–8.1)

## 2021-03-14 LAB — IRON AND TIBC
Iron: 39 ug/dL — ABNORMAL LOW (ref 41–142)
Saturation Ratios: 12 % — ABNORMAL LOW (ref 21–57)
TIBC: 324 ug/dL (ref 236–444)
UIBC: 285 ug/dL (ref 120–384)

## 2021-03-14 LAB — FERRITIN: Ferritin: 23 ng/mL (ref 11–307)

## 2021-03-14 NOTE — Progress Notes (Signed)
Hematology and Oncology Follow Up Visit  ADRAINE ISAIS [id number] [birth year] 45 y.o. 03/14/2021   Principle Diagnosis:  Iron deficiency anemia    Current Therapy:        IV iron as indicated    Interim History:  Ms. Warwick is here today for follow-up. She is doing well and has no complaints at this time.  She has not noted any blood loss. No abnormal bruising, no petechiae.  No fever, chills, n/v, cough, rash, dizziness, SOB, chest pain, palpitations, abdominal pain or changes in bowel or bladder habits.  No swelling or tenderness in her extremities.  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  No falls or syncope to report.  She has maintained a good appetite and is staying well hydrated. Her weight is stable.   ECOG Performance Status: 1 - Symptomatic but completely ambulatory  Medications:  Allergies as of 03/14/2021       Reactions   Ketorolac Other (See Comments)   Renal insufficiency   Metoprolol Swelling   Nsaids Other (See Comments)   nephropathy        Medication List        Accurate as of March 14, 2021 10:39 AM. If you have any questions, ask your nurse or doctor.          clotrimazole-betamethasone cream Commonly known as: Lotrisone Apply 1 application topically 2 (two) times daily.   fluconazole 150 MG tablet Commonly known as: DIFLUCAN Take 1 tab, repeat in 48 hours if no improvement.   folic acid 1 MG tablet Commonly known as: FOLVITE Take 1 tablet (1 mg total) by mouth daily.   HYDROcodone-acetaminophen 5-325 MG tablet Commonly known as: NORCO/VICODIN Take 1 tablet by mouth every 6 (six) hours as needed for moderate pain.   hydrOXYzine 25 MG tablet Commonly known as: ATARAX/VISTARIL Take 1 tablet (25 mg total) by mouth 2 (two) times daily.   levocetirizine 5 MG tablet Commonly known as: XYZAL Take 1 tablet (5 mg total) by mouth every evening.   meloxicam 7.5 MG tablet Commonly known as: MOBIC Take 1 tablet  (7.5 mg total) by mouth daily.   multivitamin capsule Take 1 capsule by mouth daily.   OCRELIZUMAB IV Inject 1 Dose into the vein every 6 (six) months.   omeprazole 20 MG capsule Commonly known as: PRILOSEC Take 1 capsule (20 mg total) by mouth 2 (two) times daily before a meal.   tiZANidine 2 MG tablet Commonly known as: ZANAFLEX Take 1 tablet (2 mg total) by mouth 4 (four) times daily.   traZODone 50 MG tablet Commonly known as: DESYREL Take by mouth at bedtime as needed for sleep. Unsure dose   venlafaxine XR 150 MG 24 hr capsule Commonly known as: Effexor XR Take 1 capsule (150 mg total) by mouth daily with breakfast.   Vitamin D (Ergocalciferol) 1.25 MG (50000 UNIT) Caps capsule Commonly known as: DRISDOL Take 50,000 Units by mouth once a week.   zonisamide 100 MG capsule Commonly known as: ZONEGRAN Take 300 mg by mouth daily.        Allergies:  Allergies  Allergen Reactions   Ketorolac Other (See Comments)    Renal insufficiency   Metoprolol Swelling   Nsaids Other (See Comments)    nephropathy    Past Medical History, Surgical history, Social history, and Family History were reviewed and updated.  Review of Systems: All other 10 point review of systems is negative.   Physical Exam:  weight  is 186 lb 1.3 oz (84.4 kg). Her oral temperature is 98.2 F (36.8 C). Her blood pressure is 99/72 and her pulse is 64. Her respiration is 17 and oxygen saturation is 100%.   Wt Readings from Last 3 Encounters:  03/14/21 186 lb 1.3 oz (84.4 kg)  02/26/21 185 lb 8 oz (84.1 kg)  01/17/21 183 lb 8 oz (83.2 kg)    Ocular: Sclerae unicteric, pupils equal, round and reactive to light Ear-nose-throat: Oropharynx clear, dentition fair Lymphatic: No cervical or supraclavicular adenopathy Lungs no rales or rhonchi, good excursion bilaterally Heart regular rate and rhythm, no murmur appreciated Abd soft, nontender, positive bowel sounds MSK no focal spinal tenderness,  no joint edema Neuro: non-focal, well-oriented, appropriate affect Breasts: Deferred   Lab Results  Component Value Date   WBC 4.3 03/14/2021   HGB 12.4 03/14/2021   HCT 39.9 03/14/2021   MCV 72.2 (L) 03/14/2021   PLT 254 03/14/2021   Lab Results  Component Value Date   FERRITIN 19 01/09/2021   IRON 58 01/09/2021   TIBC 288 01/09/2021   UIBC 230 01/09/2021   IRONPCTSAT 20 (L) 01/09/2021   Lab Results  Component Value Date   RETICCTPCT 0.9 03/14/2021   RBC 5.50 (H) 03/14/2021   No results found for: KPAFRELGTCHN, LAMBDASER, KAPLAMBRATIO No results found for: IGGSERUM, IGA, IGMSERUM No results found for: Odetta Pink, SPEI   Chemistry      Component Value Date/Time   NA 139 01/09/2021 0822   K 4.0 01/09/2021 0822   CL 106 01/09/2021 0822   CO2 25 01/09/2021 0822   BUN 18 01/09/2021 0822   CREATININE 1.02 (H) 01/09/2021 0822      Component Value Date/Time   CALCIUM 9.4 01/09/2021 0822   ALKPHOS 60 01/09/2021 0822   AST 18 01/09/2021 0822   ALT 16 01/09/2021 0822   BILITOT 0.6 01/09/2021 [id number]       Impression and Plan: Ms. Winborn is a very pleasant 45 yo African American female with history of iron deficiency anemia with history of GI blood loss.  Iron studies are pending. We will replace if needed.  She is doing well on Folic acid daily.  Follow-up in 4 months.  She can contact our office with any questions or concerns.   Laverna Peace, NP 8/25/202210:39 AM

## 2021-03-14 NOTE — Telephone Encounter (Signed)
Per 03/14/21 los - gave upcoming appointments - confirmed

## 2021-03-15 ENCOUNTER — Telehealth: Payer: Self-pay | Admitting: *Deleted

## 2021-03-15 NOTE — Telephone Encounter (Signed)
Per scheduling message Judson Roch 03/15/21 -called and lvm for call back to schedule (4) doses of IV Iron

## 2021-03-26 ENCOUNTER — Ambulatory Visit (INDEPENDENT_AMBULATORY_CARE_PROVIDER_SITE_OTHER): Payer: Medicare HMO

## 2021-03-26 ENCOUNTER — Telehealth: Payer: Self-pay

## 2021-03-26 VITALS — Ht 66.0 in | Wt 186.0 lb

## 2021-03-26 DIAGNOSIS — Z0000 Encounter for general adult medical examination without abnormal findings: Secondary | ICD-10-CM

## 2021-03-26 NOTE — Telephone Encounter (Signed)
Patient informed of PCP instructions. Appt. Scheduled for 03/27/21

## 2021-03-26 NOTE — Telephone Encounter (Signed)
During Medicare Wellness visit. Patient states the Oxybutynin is not helping with her urinary leakage & she wants to know what else she can take or do to help with this.

## 2021-03-26 NOTE — Patient Instructions (Signed)
Andrea Briggs , Thank you for taking time to complete your Medicare Wellness Visit. I appreciate your ongoing commitment to your health goals. Please review the following plan we discussed and let me know if I can assist you in the future.   Screening recommendations/referrals: Colonoscopy: Completed 05/07/2020-Due 05/07/2030 Mammogram: Per our conversation, you will have GYN to order. Bone Density: Not yet indicated. Due at age 45. Recommended yearly ophthalmology/optometry visit for glaucoma screening and checkup Recommended yearly dental visit for hygiene and checkup  Vaccinations: Influenza vaccine: Due-May obtain vaccine at our office or your local pharmacy. Pneumococcal vaccine: Not yet indicated. Due at age 44. Tdap vaccine: Up to date-Due 08/11/2029 Shingles vaccine:  Not yet indicated. Due at age 68. Covid-19: Booster due  Advanced directives: Declined information  Conditions/risks identified: See problem list  Next appointment: Follow up in one year for your annual wellness visit. 03/31/2022 @ 10:20  Preventive Care 40-64 Years, Female Preventive care refers to lifestyle choices and visits with your health care provider that can promote health and wellness. What does preventive care include? A yearly physical exam. This is also called an annual well check. Dental exams once or twice a year. Routine eye exams. Ask your health care provider how often you should have your eyes checked. Personal lifestyle choices, including: Daily care of your teeth and gums. Regular physical activity. Eating a healthy diet. Avoiding tobacco and drug use. Limiting alcohol use. Practicing safe sex. Taking low-dose aspirin daily starting at age 86. Taking vitamin and mineral supplements as recommended by your health care provider. What happens during an annual well check? The services and screenings done by your health care provider during your annual well check will depend on your age, overall  health, lifestyle risk factors, and family history of disease. Counseling  Your health care provider may ask you questions about your: Alcohol use. Tobacco use. Drug use. Emotional well-being. Home and relationship well-being. Sexual activity. Eating habits. Work and work Statistician. Method of birth control. Menstrual cycle. Pregnancy history. Screening  You may have the following tests or measurements: Height, weight, and BMI. Blood pressure. Lipid and cholesterol levels. These may be checked every 5 years, or more frequently if you are over 107 years old. Skin check. Lung cancer screening. You may have this screening every year starting at age 45 if you have a 30-pack-year history of smoking and currently smoke or have quit within the past 15 years. Fecal occult blood test (FOBT) of the stool. You may have this test every year starting at age 7. Flexible sigmoidoscopy or colonoscopy. You may have a sigmoidoscopy every 5 years or a colonoscopy every 10 years starting at age 81. Hepatitis C blood test. Hepatitis B blood test. Sexually transmitted disease (STD) testing. Diabetes screening. This is done by checking your blood sugar (glucose) after you have not eaten for a while (fasting). You may have this done every 1-3 years. Mammogram. This may be done every 1-2 years. Talk to your health care provider about when you should start having regular mammograms. This may depend on whether you have a family history of breast cancer. BRCA-related cancer screening. This may be done if you have a family history of breast, ovarian, tubal, or peritoneal cancers. Pelvic exam and Pap test. This may be done every 3 years starting at age 26. Starting at age 19, this may be done every 5 years if you have a Pap test in combination with an HPV test. Bone density scan. This is  done to screen for osteoporosis. You may have this scan if you are at high risk for osteoporosis. Discuss your test results,  treatment options, and if necessary, the need for more tests with your health care provider. Vaccines  Your health care provider may recommend certain vaccines, such as: Influenza vaccine. This is recommended every year. Tetanus, diphtheria, and acellular pertussis (Tdap, Td) vaccine. You may need a Td booster every 10 years. Zoster vaccine. You may need this after age 72. Pneumococcal 13-valent conjugate (PCV13) vaccine. You may need this if you have certain conditions and were not previously vaccinated. Pneumococcal polysaccharide (PPSV23) vaccine. You may need one or two doses if you smoke cigarettes or if you have certain conditions. Talk to your health care provider about which screenings and vaccines you need and how often you need them. This information is not intended to replace advice given to you by your health care provider. Make sure you discuss any questions you have with your health care provider. Document Released: 08/03/2015 Document Revised: 03/26/2016 Document Reviewed: 05/08/2015 Elsevier Interactive Patient Education  2017 [employer] Prevention in the Home Falls can cause injuries. They can happen to people of all ages. There are many things you can do to make your home safe and to help prevent falls. What can I do on the outside of my home? Regularly fix the edges of walkways and driveways and fix any cracks. Remove anything that might make you trip as you walk through a door, such as a raised step or threshold. Trim any bushes or trees on the path to your home. Use bright outdoor lighting. Clear any walking paths of anything that might make someone trip, such as rocks or tools. Regularly check to see if handrails are loose or broken. Make sure that both sides of any steps have handrails. Any raised decks and porches should have guardrails on the edges. Have any leaves, snow, or ice cleared regularly. Use sand or salt on walking paths during winter. Clean  up any spills in your garage right away. This includes oil or grease spills. What can I do in the bathroom? Use night lights. Install grab bars by the toilet and in the tub and shower. Do not use towel bars as grab bars. Use non-skid mats or decals in the tub or shower. If you need to sit down in the shower, use a plastic, non-slip stool. Keep the floor dry. Clean up any water that spills on the floor as soon as it happens. Remove soap buildup in the tub or shower regularly. Attach bath mats securely with double-sided non-slip rug tape. Do not have throw rugs and other things on the floor that can make you trip. What can I do in the bedroom? Use night lights. Make sure that you have a light by your bed that is easy to reach. Do not use any sheets or blankets that are too big for your bed. They should not hang down onto the floor. Have a firm chair that has side arms. You can use this for support while you get dressed. Do not have throw rugs and other things on the floor that can make you trip. What can I do in the kitchen? Clean up any spills right away. Avoid walking on wet floors. Keep items that you use a lot in easy-to-reach places. If you need to reach something above you, use a strong step stool that has a grab bar. Keep electrical cords out of  the way. Do not use floor polish or wax that makes floors slippery. If you must use wax, use non-skid floor wax. Do not have throw rugs and other things on the floor that can make you trip. What can I do with my stairs? Do not leave any items on the stairs. Make sure that there are handrails on both sides of the stairs and use them. Fix handrails that are broken or loose. Make sure that handrails are as long as the stairways. Check any carpeting to make sure that it is firmly attached to the stairs. Fix any carpet that is loose or worn. Avoid having throw rugs at the top or bottom of the stairs. If you do have throw rugs, attach them to the  floor with carpet tape. Make sure that you have a light switch at the top of the stairs and the bottom of the stairs. If you do not have them, ask someone to add them for you. What else can I do to help prevent falls? Wear shoes that: Do not have high heels. Have rubber bottoms. Are comfortable and fit you well. Are closed at the toe. Do not wear sandals. If you use a stepladder: Make sure that it is fully opened. Do not climb a closed stepladder. Make sure that both sides of the stepladder are locked into place. Ask someone to hold it for you, if possible. Clearly mark and make sure that you can see: Any grab bars or handrails. First and last steps. Where the edge of each step is. Use tools that help you move around (mobility aids) if they are needed. These include: Canes. Walkers. Scooters. Crutches. Turn on the lights when you go into a dark area. Replace any light bulbs as soon as they burn out. Set up your furniture so you have a clear path. Avoid moving your furniture around. If any of your floors are uneven, fix them. If there are any pets around you, be aware of where they are. Review your medicines with your doctor. Some medicines can make you feel dizzy. This can increase your chance of falling. Ask your doctor what other things that you can do to help prevent falls. This information is not intended to replace advice given to you by your health care provider. Make sure you discuss any questions you have with your health care provider. Document Released: 05/03/2009 Document Revised: 12/13/2015 Document Reviewed: 08/11/2014 Elsevier Interactive Patient Education  2017 [employer].

## 2021-03-26 NOTE — Telephone Encounter (Signed)
Wear pads, mind fluid/caffeine intake and I want to see her in the office to follow up. Ty.

## 2021-03-26 NOTE — Progress Notes (Signed)
Subjective:   Andrea Briggs is a 45 y.o. female who presents for an Initial Medicare Annual Wellness Visit.  I connected with Shantel today by telephone and verified that I am speaking with the correct person using two identifiers. Location patient: home Location provider: work Persons participating in the virtual visit: patient, Marine scientist.    I discussed the limitations, risks, security and privacy concerns of performing an evaluation and management service by telephone and the availability of in person appointments. I also discussed with the patient that there may be a patient responsible charge related to this service. The patient expressed understanding and verbally consented to this telephonic visit.    Interactive audio and video telecommunications were attempted between this provider and patient, however failed, due to patient having technical difficulties OR patient did not have access to video capability.  We continued and completed visit with audio only.  Some vital signs may be absent or patient reported.   Time Spent with patient on telephone encounter: 20 minutes   Review of Systems     Cardiac Risk Factors include: sedentary lifestyle;obesity (BMI >30kg/m2)     Objective:    Today's Vitals   03/26/21 1133  Weight: 186 lb (84.4 kg)  Height: '5\' 6"'$  (1.676 m)   Body mass index is 30.02 kg/m.  Advanced Directives 03/26/2021 03/14/2021 01/09/2021 10/17/2020 02/06/2020 10/10/2019 04/29/2017  Does Patient Have a Medical Advance Directive? No No No No No No No  Would patient like information on creating a medical advance directive? No - Patient declined No - Patient declined No - Patient declined No - Patient declined - No - Patient declined -    Current Medications (verified) Outpatient Encounter Medications as of 03/26/2021  Medication Sig   clotrimazole-betamethasone (LOTRISONE) cream Apply 1 application topically 2 (two) times daily.   folic acid (FOLVITE) 1 MG tablet  Take 1 tablet (1 mg total) by mouth daily.   HYDROcodone-acetaminophen (NORCO/VICODIN) 5-325 MG tablet Take 1 tablet by mouth every 6 (six) hours as needed for moderate pain.   hydrOXYzine (ATARAX/VISTARIL) 25 MG tablet Take 1 tablet (25 mg total) by mouth 2 (two) times daily.   levocetirizine (XYZAL) 5 MG tablet Take 1 tablet (5 mg total) by mouth every evening.   meloxicam (MOBIC) 7.5 MG tablet Take 1 tablet (7.5 mg total) by mouth daily.   Multiple Vitamin (MULTIVITAMIN) capsule Take 1 capsule by mouth daily.   OCRELIZUMAB IV Inject 1 Dose into the vein every 6 (six) months.   omeprazole (PRILOSEC) 20 MG capsule Take 1 capsule (20 mg total) by mouth 2 (two) times daily before a meal.   tiZANidine (ZANAFLEX) 2 MG tablet Take 1 tablet (2 mg total) by mouth 4 (four) times daily.   traZODone (DESYREL) 50 MG tablet Take by mouth at bedtime as needed for sleep. Unsure dose   venlafaxine XR (EFFEXOR XR) 150 MG 24 hr capsule Take 1 capsule (150 mg total) by mouth daily with breakfast.   Vitamin D, Ergocalciferol, (DRISDOL) 1.25 MG (50000 UNIT) CAPS capsule Take 50,000 Units by mouth once a week.   zonisamide (ZONEGRAN) 100 MG capsule Take 300 mg by mouth daily.   fluconazole (DIFLUCAN) 150 MG tablet Take 1 tab, repeat in 48 hours if no improvement. (Patient not taking: No sig reported)   Facility-Administered Encounter Medications as of 03/26/2021  Medication   0.9 %  sodium chloride infusion    Allergies (verified) Ketorolac, Metoprolol, and Nsaids   History: Past Medical History:  Diagnosis Date   Anemia    Anxiety    Chronic kidney disease (CKD)    follows w nephro   Chronic pain    DDD (degenerative disc disease), cervical    DDD (degenerative disc disease), lumbar    Depression    Menorrhagia with irregular cycle 10/17/2020   Migraine    MS (multiple sclerosis) (HCC)    Neuromuscular disorder ([place])    MS dx 2016   Vertigo    Past Surgical History:  Procedure Laterality Date    carpel tunnel     HERNIA REPAIR     Family History  Problem Relation Age of Onset   Cancer Maternal Grandmother        breast cancer   Stroke Maternal Grandfather    Colon cancer Neg Hx    Esophageal cancer Neg Hx    Rectal cancer Neg Hx    Stomach cancer Neg Hx    Social History   Socioeconomic History   Marital status: Married    Spouse name: Not on file   Number of children: 1   Years of education: Not on file   Highest education level: Not on file  Occupational History   Not on file  Tobacco Use   Smoking status: Never   Smokeless tobacco: Never  Vaping Use   Vaping Use: Never used  Substance and Sexual Activity   Alcohol use: No   Drug use: No   Sexual activity: Yes    Partners: Male    Birth control/protection: None  Other Topics Concern   Not on file  Social History Narrative   Not on file   Social Determinants of Health   Financial Resource Strain: Low Risk    Difficulty of Paying Living Expenses: Not hard at all  Food Insecurity: No Food Insecurity   Worried About Charity fundraiser in the Last Year: Never true   Ran Out of Food in the Last Year: Never true  Transportation Needs: No Transportation Needs   Lack of Transportation (Medical): No   Lack of Transportation (Non-Medical): No  Physical Activity: Inactive   Days of Exercise per Week: 0 days   Minutes of Exercise per Session: 0 min  Stress: No Stress Concern Present   Feeling of Stress : Not at all  Social Connections: Moderately Isolated   Frequency of Communication with Friends and Family: More than three times a week   Frequency of Social Gatherings with Friends and Family: More than three times a week   Attends Religious Services: More than 4 times per year   Active Member of [employer] or Organizations: No   Attends Archivist Meetings: Never   Marital Status: Widowed    Tobacco Counseling Counseling given: Not Answered   Clinical Intake:  Pre-visit preparation completed:  Yes  Pain : No/denies pain     BMI - recorded: 30.02 Nutritional Status: BMI > 30  Obese Nutritional Risks: None Diabetes: No  How often do you need to have someone help you when you read instructions, pamphlets, or other written materials from your doctor or pharmacy?: 1 - Never  Diabetic?No  Interpreter Needed?: No  Information entered by :: Caroleen Hamman LPN   Activities of Daily Living In your present state of health, do you have any difficulty performing the following activities: 03/26/2021  Hearing? N  Vision? N  Difficulty concentrating or making decisions? N  Walking or climbing stairs? N  Dressing or bathing? N  Doing errands,  shopping? N  Preparing Food and eating ? N  Using the Toilet? N  In the past six months, have you accidently leaked urine? Y  Do you have problems with loss of bowel control? N  Managing your Medications? N  Managing your Finances? N  Housekeeping or managing your Housekeeping? N  Some recent data might be hidden    Patient Care Team: Shelda Pal, DO as PCP - General (Family Medicine)  Indicate any recent Medical Services you may have received from other than [facility] providers in the past year (date may be approximate).     Assessment:   This is a routine wellness examination for Camiyah.  Hearing/Vision screen Hearing Screening - Comments:: No issues Vision Screening - Comments:: Has an upcoming appt  Dietary issues and exercise activities discussed: Current Exercise Habits: The patient does not participate in regular exercise at present, Exercise limited by: Other - see comments (Mutiple Sclerosis)   Goals Addressed             This Visit's Progress    Patient Stated       Eat healthier, drink more water & increase activity       Depression Screen PHQ 2/9 Scores 03/26/2021 10/31/2020  PHQ - 2 Score 0 0    Fall Risk Fall Risk  03/26/2021  Falls in the past year? 1  Number falls in past yr: 1  Injury with  Fall? 0  Risk for fall due to : History of fall(s)  Follow up Falls prevention discussed    [place]:  Any stairs in or around the home? No  Home free of loose throw rugs in walkways, pet beds, electrical cords, etc? Yes  Adequate lighting in your home to reduce risk of falls? Yes   ASSISTIVE DEVICES UTILIZED TO PREVENT FALLS:  Life alert? No  Use of a cane, walker or w/c? No  Grab bars in the bathroom? No  Shower chair or bench in shower? No  Elevated toilet seat or a handicapped toilet? No   TIMED UP AND GO:  Was the test performed? No . Phone visit   Cognitive Function:Normal cognitive status assessed by this Nurse Health Advisor. No abnormalities found.          Immunizations Immunization History  Administered Date(s) Administered   Influenza,inj,Quad PF,6+ Mos 06/26/2017, 04/30/2018, 08/12/2019, 05/11/2020   Moderna Sars-Covid-2 Vaccination 01/28/2020, 02/26/2020   Tdap 08/12/2019    TDAP status: Up to date  Flu Vaccine status: Due, Education has been provided regarding the importance of this vaccine. Advised may receive this vaccine at local pharmacy or Health Dept. Aware to provide a copy of the vaccination record if obtained from local pharmacy or Health Dept. Verbalized acceptance and understanding.  Pneumococcal vaccine status: Not yet indicated  Covid-19 vaccine status: Information provided on how to obtain vaccines. Booster due  Qualifies for Shingles Vaccine? Not yet indicated  Screening Tests Health Maintenance  Topic Date Due   Hepatitis C Screening  Never done   PAP SMEAR-Modifier  09/04/2019   COVID-19 Vaccine (3 - Booster for Moderna series) 07/28/2020   INFLUENZA VACCINE  02/18/2021   TETANUS/TDAP  08/11/2029   COLONOSCOPY (Pts 45-72yr Insurance coverage will need to be confirmed)  05/07/2030   HIV Screening  Completed   Pneumococcal Vaccine [id number] old  Aged Out   HPV VManghamMaintenance Due  Topic Date Due  Hepatitis C Screening  Never done   PAP SMEAR-Modifier  09/04/2019   COVID-19 Vaccine (3 - Booster for Moderna series) 07/28/2020   INFLUENZA VACCINE  02/18/2021    Colorectal cancer screening: Type of screening: Colonoscopy. Completed 05/07/2020. Repeat every 10 years  Mammogram status: Patient states she will have GYN order.  Bone Density status: Not yet indicated  Lung Cancer Screening: (Low Dose CT Chest recommended if Age 6-80 years, 30 pack-year currently smoking OR have quit w/in 15years.) does not qualify.     Additional Screening:  Hepatitis C Screening: does not qualify  Vision Screening: Recommended annual ophthalmology exams for early detection of glaucoma and other disorders of the eye. Is the patient up to date with their annual eye exam?  No  Patient states she has an upcoming appt  Dental Screening: Recommended annual dental exams for proper oral hygiene  Community Resource Referral / Chronic Care Management: CRR required this visit?  No   CCM required this visit?  No      Plan:     I have personally reviewed and noted the following in the patient's chart:   Medical and social history Use of alcohol, tobacco or illicit drugs  Current medications and supplements including opioid prescriptions. Patient is not currently taking opioid prescriptions. Functional ability and status Nutritional status Physical activity Advanced directives List of other physicians Hospitalizations, surgeries, and ER visits in previous 12 months Vitals Screenings to include cognitive, depression, and falls Referrals and appointments  In addition, I have reviewed and discussed with patient certain preventive protocols, quality metrics, and best practice recommendations. A written personalized care plan for preventive services as well as general preventive health recommendations were provided to patient.   Due to this  being a telephonic visit, the after visit summary with patients personalized plan was offered to patient via mail or my-chart. Patient declined at this time.   Marta Antu, LPN   [id number]  Nurse Health Advisor  Nurse Notes: None

## 2021-03-27 ENCOUNTER — Encounter: Payer: Self-pay | Admitting: Family Medicine

## 2021-03-27 ENCOUNTER — Telehealth: Payer: Self-pay | Admitting: Family Medicine

## 2021-03-27 ENCOUNTER — Other Ambulatory Visit: Payer: Self-pay

## 2021-03-27 ENCOUNTER — Telehealth (INDEPENDENT_AMBULATORY_CARE_PROVIDER_SITE_OTHER): Payer: Medicare HMO | Admitting: Family Medicine

## 2021-03-27 DIAGNOSIS — N3281 Overactive bladder: Secondary | ICD-10-CM

## 2021-03-27 MED ORDER — MIRABEGRON ER 25 MG PO TB24: 25.0000 mg | ORAL_TABLET | Freq: Every day | ORAL | 2 refills | Status: DC

## 2021-03-27 MED ORDER — TOLTERODINE TARTRATE ER 2 MG PO CP24: 2.0000 mg | ORAL_CAPSULE | Freq: Every day | ORAL | 2 refills | Status: DC

## 2021-03-27 NOTE — Telephone Encounter (Signed)
Patient called stating the price is too high for the medication prescribed to her today  mirabegron ER (MYRBETRIQ) 25 MG TB24 tablet. She stated Wendling wanted to know if it was too high for her. Please advice.

## 2021-03-27 NOTE — Progress Notes (Signed)
Chief Complaint  Patient presents with   urine leakage    Subjective: Patient is a 45 y.o. female here for f/u urine leakage. Due to COVID-19 pandemic, we are interacting via telephone. I verified patient's ID using 2 identifiers. Patient agreed to proceed with visit via this method. Patient is at home, I am at office. Patient and I are present for visit.   Patient continues to experience urine leakage.  It is not when she laughs, sneezes, or coughs.  Movements are normal and she does not strain.  She is regularly going once daily.  She is not having any pelvic pain or dysuria and denies any bleeding or discharge.  She has been compliant with oxybutynin XL 10 mg daily.  No side effects.  She reports about 5 to 10% improvement since starting.  She does report incomplete emptying in addition to the leakage.  Past Medical History:  Diagnosis Date   Anemia    Anxiety    Chronic kidney disease (CKD)    follows w nephro   Chronic pain    DDD (degenerative disc disease), cervical    DDD (degenerative disc disease), lumbar    Depression    Menorrhagia with irregular cycle 10/17/2020   Migraine    MS (multiple sclerosis) ([place])    Neuromuscular disorder ([place])    MS dx 2016   Vertigo     Objective: No conversational dyspnea Age appropriate judgment and insight Nml affect and mood  Assessment and Plan: Overactive bladder - Plan: mirabegron ER (MYRBETRIQ) 25 MG TB24 tablet  Stop Ditropan XL 10 mg/d as she has failed this, start Myrbetriq 25 mg/d. Mind fluid intake, caffeine intake. She drinks around 60-65 oz daily. She does not consume alcohol and has started to cut down soft drink consumption. Fu in 1 mo. Total time: 11 min  The patient voiced understanding and agreement to the plan.  [place], DO 03/27/21  11:20 AM

## 2021-03-27 NOTE — Telephone Encounter (Signed)
Another med sent in, let me know if there are cost issues again. Ty.

## 2021-03-27 NOTE — Telephone Encounter (Signed)
Patient informed. 

## 2021-03-28 ENCOUNTER — Telehealth: Payer: Self-pay | Admitting: Family Medicine

## 2021-03-28 NOTE — Telephone Encounter (Signed)
Pt. Called in and stated medicine came back to be to expensive and wanted to see if she can be prescribed something new  tolterodine (DETROL LA) 2 MG 24 hr capsule

## 2021-03-29 ENCOUNTER — Telehealth: Payer: Self-pay | Admitting: Family Medicine

## 2021-03-29 MED ORDER — SOLIFENACIN SUCCINATE 5 MG PO TABS: 5.0000 mg | ORAL_TABLET | Freq: Every day | ORAL | 2 refills | Status: DC

## 2021-03-29 NOTE — Telephone Encounter (Signed)
Called and spoke to the patient. See other telelphone note dated 03/27/21

## 2021-03-29 NOTE — Telephone Encounter (Signed)
If this isn't covered, I will place a referral to the urology team. Ty.

## 2021-03-29 NOTE — Telephone Encounter (Signed)
Called the patient informed.

## 2021-03-29 NOTE — Telephone Encounter (Signed)
Pt. Returning call.

## 2021-03-29 NOTE — Telephone Encounter (Signed)
Called left message to call back 

## 2021-04-16 DIAGNOSIS — S99921A Unspecified injury of right foot, initial encounter: Secondary | ICD-10-CM | POA: Diagnosis not present

## 2021-04-16 DIAGNOSIS — Y999 Unspecified external cause status: Secondary | ICD-10-CM | POA: Diagnosis not present

## 2021-04-16 DIAGNOSIS — Y9361 Activity, american tackle football: Secondary | ICD-10-CM | POA: Diagnosis not present

## 2021-04-16 DIAGNOSIS — W2101XA Struck by football, initial encounter: Secondary | ICD-10-CM | POA: Diagnosis not present

## 2021-04-16 DIAGNOSIS — S91201A Unspecified open wound of right great toe with damage to nail, initial encounter: Secondary | ICD-10-CM | POA: Diagnosis not present

## 2021-04-18 DIAGNOSIS — Z3009 Encounter for other general counseling and advice on contraception: Secondary | ICD-10-CM | POA: Diagnosis not present

## 2021-04-18 DIAGNOSIS — N898 Other specified noninflammatory disorders of vagina: Secondary | ICD-10-CM | POA: Diagnosis not present

## 2021-04-18 DIAGNOSIS — Z01419 Encounter for gynecological examination (general) (routine) without abnormal findings: Secondary | ICD-10-CM | POA: Diagnosis not present

## 2021-04-18 DIAGNOSIS — E663 Overweight: Secondary | ICD-10-CM | POA: Diagnosis not present

## 2021-04-29 ENCOUNTER — Ambulatory Visit: Payer: Medicare HMO

## 2021-05-01 ENCOUNTER — Ambulatory Visit: Payer: Medicare HMO

## 2021-05-16 ENCOUNTER — Encounter: Payer: Self-pay | Admitting: Family Medicine

## 2021-05-16 ENCOUNTER — Ambulatory Visit (INDEPENDENT_AMBULATORY_CARE_PROVIDER_SITE_OTHER): Payer: Medicare HMO

## 2021-05-16 ENCOUNTER — Other Ambulatory Visit: Payer: Self-pay

## 2021-05-16 DIAGNOSIS — Z23 Encounter for immunization: Secondary | ICD-10-CM

## 2021-05-29 DIAGNOSIS — Z79899 Other long term (current) drug therapy: Secondary | ICD-10-CM | POA: Diagnosis not present

## 2021-05-29 DIAGNOSIS — G35 Multiple sclerosis: Secondary | ICD-10-CM | POA: Diagnosis not present

## 2021-07-16 ENCOUNTER — Telehealth: Payer: Self-pay | Admitting: Family Medicine

## 2021-07-16 ENCOUNTER — Encounter: Payer: Self-pay | Admitting: Family

## 2021-07-16 ENCOUNTER — Inpatient Hospital Stay (HOSPITAL_BASED_OUTPATIENT_CLINIC_OR_DEPARTMENT_OTHER): Payer: Medicare HMO | Admitting: Family

## 2021-07-16 ENCOUNTER — Other Ambulatory Visit: Payer: Self-pay

## 2021-07-16 ENCOUNTER — Inpatient Hospital Stay: Payer: Medicare HMO | Attending: Hematology & Oncology

## 2021-07-16 ENCOUNTER — Telehealth: Payer: Self-pay

## 2021-07-16 ENCOUNTER — Telehealth: Payer: Self-pay | Admitting: *Deleted

## 2021-07-16 VITALS — BP 113/72 | HR 75 | Temp 98.9°F | Resp 16 | Wt 201.0 lb

## 2021-07-16 DIAGNOSIS — N921 Excessive and frequent menstruation with irregular cycle: Secondary | ICD-10-CM

## 2021-07-16 DIAGNOSIS — D500 Iron deficiency anemia secondary to blood loss (chronic): Secondary | ICD-10-CM

## 2021-07-16 DIAGNOSIS — D509 Iron deficiency anemia, unspecified: Secondary | ICD-10-CM | POA: Insufficient documentation

## 2021-07-16 LAB — CBC WITH DIFFERENTIAL (CANCER CENTER ONLY)
Abs Immature Granulocytes: 0.02 10*3/uL (ref 0.00–0.07)
Basophils Absolute: 0 10*3/uL (ref 0.0–0.1)
Basophils Relative: 1 %
Eosinophils Absolute: 0.1 10*3/uL (ref 0.0–0.5)
Eosinophils Relative: 2 %
HCT: 35.2 % — ABNORMAL LOW (ref 36.0–46.0)
Hemoglobin: 11 g/dL — ABNORMAL LOW (ref 12.0–15.0)
Immature Granulocytes: 1 %
Lymphocytes Relative: 43 %
Lymphs Abs: 1.5 10*3/uL (ref 0.7–4.0)
MCH: 23.4 pg — ABNORMAL LOW (ref 26.0–34.0)
MCHC: 31.3 g/dL (ref 30.0–36.0)
MCV: 74.7 fL — ABNORMAL LOW (ref 80.0–100.0)
Monocytes Absolute: 0.5 10*3/uL (ref 0.1–1.0)
Monocytes Relative: 13 %
Neutro Abs: 1.3 10*3/uL — ABNORMAL LOW (ref 1.7–7.7)
Neutrophils Relative %: 40 %
Platelet Count: 291 10*3/uL (ref 150–400)
RBC: 4.71 MIL/uL (ref 3.87–5.11)
RDW: 15.7 % — ABNORMAL HIGH (ref 11.5–15.5)
WBC Count: 3.4 10*3/uL — ABNORMAL LOW (ref 4.0–10.5)
nRBC: 0 % (ref 0.0–0.2)

## 2021-07-16 LAB — RETICULOCYTES
Immature Retic Fract: 16.1 % — ABNORMAL HIGH (ref 2.3–15.9)
RBC.: 4.72 MIL/uL (ref 3.87–5.11)
Retic Count, Absolute: 51.4 10*3/uL (ref 19.0–186.0)
Retic Ct Pct: 1.1 % (ref 0.4–3.1)

## 2021-07-16 LAB — IRON AND IRON BINDING CAPACITY (CC-WL,HP ONLY)
Iron: 40 ug/dL (ref 28–170)
Saturation Ratios: 13 % (ref 10.4–31.8)
TIBC: 321 ug/dL (ref 250–450)
UIBC: 281 ug/dL (ref 148–442)

## 2021-07-16 LAB — FERRITIN: Ferritin: 12 ng/mL (ref 11–307)

## 2021-07-16 NOTE — Telephone Encounter (Signed)
Pt scheduled , stated she would try her best to make it since she doesn't have a car currently

## 2021-07-16 NOTE — Progress Notes (Signed)
Hematology and Oncology Follow Up Visit  Andrea Briggs [id number] 1976-07-11 45 y.o. 07/16/2021   Principle Diagnosis:  Iron deficiency anemia    Current Therapy:        IV iron as indicated    Interim History:  Andrea Briggs is here today for follow-up. She is doing well and has no complaints at this time.  He cycle is regular lasting only 3 days with normal flow.  No other blood loss noted. No abnormal bruising, no petechiae.  No fever, chills, n/v, cough, rash, dizziness, SOB, chest pain or changes in bowel or bladder habits.  She has random episodes of abdominal discomfort. She has not associated this with any trigger such as food.  No swelling or tenderness in her extremities.  She has intermittent tingling in her hands that is unchanged from her baseline.  No falls or syncope to report.  She has maintained a good appetite and is staying well hydrated. Her weight is 201 lbs.   ECOG Performance Status: 0 - Asymptomatic  Medications:  Allergies as of 07/16/2021       Reactions   Ketorolac Other (See Comments)   Renal insufficiency   Metoprolol Swelling   Nsaids Other (See Comments)   nephropathy        Medication List        Accurate as of July 16, 2021 10:05 AM. If you have any questions, ask your nurse or doctor.          clotrimazole-betamethasone cream Commonly known as: Lotrisone Apply 1 application topically 2 (two) times daily.   folic acid 1 MG tablet Commonly known as: FOLVITE Take 1 tablet (1 mg total) by mouth daily.   HYDROcodone-acetaminophen 5-325 MG tablet Commonly known as: NORCO/VICODIN Take 1 tablet by mouth every 6 (six) hours as needed for moderate pain.   hydrOXYzine 25 MG tablet Commonly known as: ATARAX Take 1 tablet (25 mg total) by mouth 2 (two) times daily.   levocetirizine 5 MG tablet Commonly known as: XYZAL Take 1 tablet (5 mg total) by mouth every evening.   meloxicam 7.5 MG tablet Commonly known as:  MOBIC Take 1 tablet (7.5 mg total) by mouth daily.   multivitamin capsule Take 1 capsule by mouth daily.   OCRELIZUMAB IV Inject 1 Dose into the vein every 6 (six) months.   omeprazole 20 MG capsule Commonly known as: PRILOSEC Take 1 capsule (20 mg total) by mouth 2 (two) times daily before a meal.   solifenacin 5 MG tablet Commonly known as: VESICARE Take 1 tablet (5 mg total) by mouth daily.   tiZANidine 2 MG tablet Commonly known as: ZANAFLEX Take 1 tablet (2 mg total) by mouth 4 (four) times daily.   traZODone 50 MG tablet Commonly known as: DESYREL Take by mouth at bedtime as needed for sleep. Unsure dose   venlafaxine XR 150 MG 24 hr capsule Commonly known as: Effexor XR Take 1 capsule (150 mg total) by mouth daily with breakfast.   Vitamin D (Ergocalciferol) 1.25 MG (50000 UNIT) Caps capsule Commonly known as: DRISDOL Take 50,000 Units by mouth once a week.   zonisamide 100 MG capsule Commonly known as: ZONEGRAN Take 300 mg by mouth daily.        Allergies:  Allergies  Allergen Reactions   Ketorolac Other (See Comments)    Renal insufficiency   Metoprolol Swelling   Nsaids Other (See Comments)    nephropathy    Past Medical History, Surgical history, Social history,  and Family History were reviewed and updated.  Review of Systems: All other 10 point review of systems is negative.   Physical Exam:  weight is 201 lb 0.6 oz (91.2 kg). Her oral temperature is 98.9 F (37.2 C). Her blood pressure is 113/72 and her pulse is 75. Her respiration is 16 and oxygen saturation is 100%.   Wt Readings from Last 3 Encounters:  07/16/21 201 lb 0.6 oz (91.2 kg)  03/26/21 186 lb (84.4 kg)  03/14/21 186 lb 1.3 oz (84.4 kg)    Ocular: Sclerae unicteric, pupils equal, round and reactive to light Ear-nose-throat: Oropharynx clear, dentition fair Lymphatic: No cervical or supraclavicular adenopathy Lungs no rales or rhonchi, good excursion bilaterally Heart  regular rate and rhythm, no murmur appreciated Abd soft, nontender, positive bowel sounds MSK no focal spinal tenderness, no joint edema Neuro: non-focal, well-oriented, appropriate affect Breasts: Deferred   Lab Results  Component Value Date   WBC 3.4 (L) 07/16/2021   HGB 11.0 (L) 07/16/2021   HCT 35.2 (L) 07/16/2021   MCV 74.7 (L) 07/16/2021   PLT 291 07/16/2021   Lab Results  Component Value Date   FERRITIN 23 03/14/2021   IRON 39 (L) 03/14/2021   TIBC 324 03/14/2021   UIBC 285 03/14/2021   IRONPCTSAT 12 (L) 03/14/2021   Lab Results  Component Value Date   RETICCTPCT 1.1 07/16/2021   RBC 4.72 07/16/2021   RBC 4.71 07/16/2021   No results found for: KPAFRELGTCHN, LAMBDASER, KAPLAMBRATIO No results found for: IGGSERUM, IGA, IGMSERUM No results found for: Odetta Pink, SPEI   Chemistry      Component Value Date/Time   NA 138 03/14/2021 0950   K 4.1 03/14/2021 0950   CL 106 03/14/2021 0950   CO2 24 03/14/2021 0950   BUN 13 03/14/2021 0950   CREATININE 1.05 (H) 03/14/2021 0950      Component Value Date/Time   CALCIUM 9.0 03/14/2021 0950   ALKPHOS 62 03/14/2021 0950   AST 14 (L) 03/14/2021 0950   ALT 10 03/14/2021 0950   BILITOT 0.8 03/14/2021 0950       Impression and Plan: Andrea Briggs is a very pleasant 45 yo African American female with history of iron deficiency anemia with history of GI blood loss.  Iron studies are pending. We will replace if needed.  Follow-up in 6 months.   Lottie Dawson, NP 12/27/202210:05 AM

## 2021-07-16 NOTE — Telephone Encounter (Signed)
Per 07/16/21 los - gave upcoming appointments - confirmed

## 2021-07-16 NOTE — Telephone Encounter (Signed)
Pt came in office stating that she has a UTI or Kidney issues and is wanting to get rx for treatment- pt states provider already knows her situation and would like rx send to her pharmacy, if possible (pt was informed for rx for any infection is needing appt with provider but pt insisted to send message first to provider) Pharmacy [facility] on [address],  [place]. Any question pt tel [phone].

## 2021-07-16 NOTE — Telephone Encounter (Signed)
-----   Message from Volanda Napoleon, MD sent at 07/16/2021  1:57 PM EST ----- Call - the iron is low!!  She needs some IV iron!!  Laurey Arrow

## 2021-07-16 NOTE — Telephone Encounter (Signed)
Called and left message with patient regarding iron results, and that scheduling will call once insurance approves the iron order.

## 2021-07-17 ENCOUNTER — Encounter (HOSPITAL_BASED_OUTPATIENT_CLINIC_OR_DEPARTMENT_OTHER): Payer: Self-pay

## 2021-07-17 ENCOUNTER — Telehealth: Payer: Self-pay | Admitting: Family

## 2021-07-17 ENCOUNTER — Emergency Department (HOSPITAL_BASED_OUTPATIENT_CLINIC_OR_DEPARTMENT_OTHER): Payer: Medicare HMO

## 2021-07-17 ENCOUNTER — Encounter: Payer: Self-pay | Admitting: Family

## 2021-07-17 ENCOUNTER — Emergency Department (HOSPITAL_BASED_OUTPATIENT_CLINIC_OR_DEPARTMENT_OTHER)
Admission: EM | Admit: 2021-07-17 | Discharge: 2021-07-17 | Disposition: A | Discharge status: Home / Self Care | Payer: Medicare HMO | Attending: Emergency Medicine | Admitting: Emergency Medicine

## 2021-07-17 ENCOUNTER — Other Ambulatory Visit: Payer: Self-pay

## 2021-07-17 ENCOUNTER — Ambulatory Visit: Payer: Medicare HMO | Admitting: Family Medicine

## 2021-07-17 ENCOUNTER — Other Ambulatory Visit: Payer: Self-pay | Admitting: Family

## 2021-07-17 DIAGNOSIS — R1031 Right lower quadrant pain: Secondary | ICD-10-CM | POA: Diagnosis present

## 2021-07-17 DIAGNOSIS — R110 Nausea: Secondary | ICD-10-CM | POA: Diagnosis not present

## 2021-07-17 DIAGNOSIS — N898 Other specified noninflammatory disorders of vagina: Secondary | ICD-10-CM | POA: Insufficient documentation

## 2021-07-17 DIAGNOSIS — R1030 Lower abdominal pain, unspecified: Secondary | ICD-10-CM

## 2021-07-17 DIAGNOSIS — R1084 Generalized abdominal pain: Secondary | ICD-10-CM | POA: Insufficient documentation

## 2021-07-17 DIAGNOSIS — N189 Chronic kidney disease, unspecified: Secondary | ICD-10-CM | POA: Insufficient documentation

## 2021-07-17 DIAGNOSIS — R109 Unspecified abdominal pain: Secondary | ICD-10-CM | POA: Diagnosis not present

## 2021-07-17 DIAGNOSIS — G35 Multiple sclerosis: Secondary | ICD-10-CM | POA: Diagnosis not present

## 2021-07-17 DIAGNOSIS — R102 Pelvic and perineal pain: Secondary | ICD-10-CM | POA: Diagnosis not present

## 2021-07-17 LAB — WET PREP, GENITAL
Clue Cells Wet Prep HPF POC: NONE SEEN
Sperm: NONE SEEN
Trich, Wet Prep: NONE SEEN
WBC, Wet Prep HPF POC: >=10 — AB (ref <10)
Yeast Wet Prep HPF POC: NONE SEEN

## 2021-07-17 LAB — COMPREHENSIVE METABOLIC PANEL
ALT: 15 U/L (ref 0–44)
AST: 15 U/L (ref 15–41)
Albumin: 3.5 g/dL (ref 3.5–5.0)
Alkaline Phosphatase: 56 U/L (ref 38–126)
Anion gap: 7 (ref 5–15)
BUN: 15 mg/dL (ref 6–20)
CO2: 23 mmol/L (ref 22–32)
Calcium: 8.5 mg/dL — ABNORMAL LOW (ref 8.9–10.3)
Chloride: 106 mmol/L (ref 98–111)
Creatinine, Ser: 0.85 mg/dL (ref 0.44–1.00)
GFR, Estimated: >60 mL/min (ref >60)
Glucose, Bld: 94 mg/dL (ref 70–99)
Potassium: 3.7 mmol/L (ref 3.5–5.1)
Sodium: 136 mmol/L (ref 135–145)
Total Bilirubin: 0.7 mg/dL (ref 0.3–1.2)
Total Protein: 6.4 g/dL — ABNORMAL LOW (ref 6.5–8.1)

## 2021-07-17 LAB — CBC
HCT: 35.7 % — ABNORMAL LOW (ref 36.0–46.0)
Hemoglobin: 11.3 g/dL — ABNORMAL LOW (ref 12.0–15.0)
MCH: 23.3 pg — ABNORMAL LOW (ref 26.0–34.0)
MCHC: 31.7 g/dL (ref 30.0–36.0)
MCV: 73.8 fL — ABNORMAL LOW (ref 80.0–100.0)
Platelets: 278 10*3/uL (ref 150–400)
RBC: 4.84 MIL/uL (ref 3.87–5.11)
RDW: 15.8 % — ABNORMAL HIGH (ref 11.5–15.5)
WBC: 3.1 10*3/uL — ABNORMAL LOW (ref 4.0–10.5)
nRBC: 0 % (ref 0.0–0.2)

## 2021-07-17 LAB — URINALYSIS, ROUTINE W REFLEX MICROSCOPIC
Bilirubin Urine: NEGATIVE
Glucose, UA: NEGATIVE mg/dL
Ketones, ur: NEGATIVE mg/dL
Nitrite: NEGATIVE
Protein, ur: NEGATIVE mg/dL
Specific Gravity, Urine: 1.025 (ref 1.005–1.030)
pH: 7 (ref 5.0–8.0)

## 2021-07-17 LAB — URINALYSIS, MICROSCOPIC (REFLEX): WBC, UA: >50 WBC/hpf (ref 0–5)

## 2021-07-17 LAB — PREGNANCY, URINE: Preg Test, Ur: NEGATIVE

## 2021-07-17 LAB — LIPASE, BLOOD: Lipase: 25 U/L (ref 11–51)

## 2021-07-17 MED ORDER — ONDANSETRON HCL 4 MG/2ML IJ SOLN
4.0000 mg | Freq: Once | INTRAMUSCULAR | Status: AC
  Administered 2021-07-17: 11:00:00 4 mg via INTRAVENOUS
  Filled 2021-07-17: qty 2

## 2021-07-17 MED ORDER — IOHEXOL 300 MG/ML  SOLN
100.0000 mL | Freq: Once | INTRAMUSCULAR | Status: AC | PRN
  Administered 2021-07-17: 11:00:00 100 mL via INTRAVENOUS

## 2021-07-17 MED ORDER — SODIUM CHLORIDE 0.9 % IV BOLUS
1000.0000 mL | Freq: Once | INTRAVENOUS | Status: AC
  Administered 2021-07-17: 11:00:00 1000 mL via INTRAVENOUS

## 2021-07-17 MED ORDER — FENTANYL CITRATE PF 50 MCG/ML IJ SOSY
50.0000 ug | PREFILLED_SYRINGE | Freq: Once | INTRAMUSCULAR | Status: AC
  Administered 2021-07-17: 11:00:00 50 ug via INTRAVENOUS
  Filled 2021-07-17: qty 1

## 2021-07-17 NOTE — ED Triage Notes (Signed)
Pt arrives ambulatory to ED with c/o abdominal pain over the last week states that over the last few days it has been RLQ pain and moved into lower middle abdomen. Pt states she was constipated but is now having loose stool. Some nausea, no vomiting.

## 2021-07-17 NOTE — Discharge Instructions (Addendum)
Follow-up with your primary care doctor.  Return here as needed if you have any worsening symptoms.

## 2021-07-17 NOTE — Telephone Encounter (Signed)
Scheduled appt per 12/28 sch msg - patient is aware of appt date and time

## 2021-07-17 NOTE — ED Provider Notes (Signed)
[facility] EMERGENCY DEPARTMENT Provider Note   CSN: [id number] Arrival date & time: 07/17/21  1017     History Chief Complaint  Patient presents with   Abdominal Pain    Andrea Briggs is a 45 y.o. female.  Patient is a 45 year old female with a history of MS and chronic kidney disease who presents with abdominal pain.  She said it started about a week ago.  It is now located in the right lower abdomen and in the middle lower abdomen.  She initially was constipated but now she is having some loose stools.  She has had nausea but no vomiting.  No urinary symptoms.  No known fevers.  She does have some yellow vaginal discharge.      Past Medical History:  Diagnosis Date   Anemia    Anxiety    Chronic kidney disease (CKD)    follows w nephro   Chronic pain    DDD (degenerative disc disease), cervical    DDD (degenerative disc disease), lumbar    Depression    Menorrhagia with irregular cycle 10/17/2020   Migraine    MS (multiple sclerosis) ([place])    Neuromuscular disorder ([place])    MS dx 2016   Vertigo     Patient Active Problem List   Diagnosis Date Noted   Iron deficiency anemia due to chronic blood loss 10/17/2020   Menorrhagia with irregular cycle 10/17/2020   Fever and chills 07/30/2020   Weakness of both lower extremities 04/30/2018   Greater trochanteric bursitis of left hip 03/10/2018   Aortic atherosclerosis ([place]) 11/12/2017   Fibromyalgia 08/13/2017   MS (multiple sclerosis) ([facility])    DDD (degenerative disc disease), cervical    DDD (degenerative disc disease), lumbar    Chronic kidney disease (CKD)    Chronic pain    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     Family History  Problem Relation Age of Onset   Cancer Maternal Grandmother        breast cancer   Stroke Maternal Grandfather     Colon cancer Neg Hx    Esophageal cancer Neg Hx    Rectal cancer Neg Hx    Stomach cancer Neg Hx     Social History   Tobacco Use   Smoking status: Never   Smokeless tobacco: Never  Vaping Use   Vaping Use: Never used  Substance Use Topics   Alcohol use: No   Drug use: No    Home Medications Prior to Admission medications   Medication Sig Start Date End Date Taking? Authorizing Provider  clotrimazole-betamethasone (LOTRISONE) cream Apply 1 application topically 2 (two) times daily. 02/26/21   Shelda Pal, DO  folic acid (FOLVITE) 1 MG tablet Take 1 tablet (1 mg total) by mouth daily. 01/09/21   Celso Amy, NP  HYDROcodone-acetaminophen (NORCO/VICODIN) 5-325 MG tablet Take 1 tablet by mouth every 6 (six) hours as needed for moderate pain. 02/26/21   Shelda Pal, DO  hydrOXYzine (ATARAX/VISTARIL) 25 MG tablet Take 1 tablet (25 mg total) by mouth 2 (two) times daily. 02/11/21   Shelda Pal, DO  levocetirizine (XYZAL) 5 MG tablet Take 1 tablet (5 mg total) by mouth every evening. 10/31/20   Shelda Pal, DO  meloxicam [facility])  7.5 MG tablet Take 1 tablet (7.5 mg total) by mouth daily. 10/10/20   Shelda Pal, DO  Multiple Vitamin (MULTIVITAMIN) capsule Take 1 capsule by mouth daily.    [provider]  OCRELIZUMAB IV Inject 1 Dose into the vein every 6 (six) months.    [provider]  omeprazole (PRILOSEC) 20 MG capsule Take 1 capsule (20 mg total) by mouth 2 (two) times daily before a meal. 10/22/20   Cirigliano, Vito V, DO  solifenacin (VESICARE) 5 MG tablet Take 1 tablet (5 mg total) by mouth daily. 03/29/21   Shelda Pal, DO  tiZANidine (ZANAFLEX) 2 MG tablet Take 1 tablet (2 mg total) by mouth 4 (four) times daily. 02/11/21   Shelda Pal, DO  traZODone (DESYREL) 50 MG tablet Take by mouth at bedtime as needed for sleep. Unsure dose Patient not taking: Reported on 07/16/2021    [provider]  venlafaxine XR (EFFEXOR XR) 150 MG 24 hr capsule Take 1 capsule (150 mg total) by mouth daily with breakfast. 10/31/20   Shelda Pal, DO  Vitamin D, Ergocalciferol, (DRISDOL) 1.25 MG (50000 UNIT) CAPS capsule Take 50,000 Units by mouth once a week. 01/09/20   [provider]  zonisamide (ZONEGRAN) 100 MG capsule Take 300 mg by mouth daily.    [provider]    Allergies    Ketorolac, Metoprolol, and Nsaids  Review of Systems   Review of Systems  Constitutional:  Negative for chills, diaphoresis, fatigue and fever.  HENT:  Negative for congestion, rhinorrhea and sneezing.   Eyes: Negative.   Respiratory:  Negative for cough, chest tightness and shortness of breath.   Cardiovascular:  Negative for chest pain and leg swelling.  Gastrointestinal:  Positive for abdominal pain and nausea. Negative for blood in stool, diarrhea and vomiting.  Genitourinary:  Negative for difficulty urinating, flank pain, frequency and hematuria.  Musculoskeletal:  Negative for arthralgias and back pain.  Skin:  Negative for rash.  Neurological:  Negative for dizziness, speech difficulty, weakness, numbness and headaches.   Physical Exam Updated Vital Signs BP 111/76    Pulse 60    Temp 97.7 F (36.5 C) (Oral)    Resp 18    Ht 5\' 6"  (1.676 m)    Wt 91.2 kg    LMP 07/10/2021    SpO2 100%    BMI 32.44 kg/m   Physical Exam Constitutional:      Appearance: She is well-developed.  HENT:     Head: Normocephalic and atraumatic.  Eyes:     Pupils: Pupils are equal, round, and reactive to light.  Cardiovascular:     Rate and Rhythm: Normal rate and regular rhythm.     Heart sounds: Normal heart sounds.  Pulmonary:     Effort: Pulmonary effort is normal. No respiratory distress.     Breath sounds: Normal breath sounds. No wheezing or rales.  Chest:     Chest wall: No tenderness.  Abdominal:     General: Bowel sounds are normal.     Palpations: Abdomen is soft.      Tenderness: There is generalized abdominal tenderness. There is no guarding or rebound.  Genitourinary:    Comments: Thick yellow discharge, no cervical motion tenderness, no adnexal tenderness Musculoskeletal:        General: Normal range of motion.     Cervical back: Normal range of motion and neck supple.  Lymphadenopathy:     Cervical: No cervical adenopathy.  Skin:    General: Skin is warm and dry.     Findings: No rash.  Neurological:     Mental Status: She is alert and oriented to person, place, and time.    ED Results / Procedures / Treatments   Labs (all labs ordered are listed, but only abnormal results are displayed) Labs Reviewed  WET PREP, GENITAL - Abnormal; Notable for the following components:      Result Value   WBC, Wet Prep HPF POC >=10 (*)    All other components within normal limits  COMPREHENSIVE METABOLIC PANEL - Abnormal; Notable for the following components:   Calcium 8.5 (*)    Total Protein 6.4 (*)    All other components within normal limits  CBC - Abnormal; Notable for the following components:   WBC 3.1 (*)    Hemoglobin 11.3 (*)    HCT 35.7 (*)    MCV 73.8 (*)    MCH 23.3 (*)    RDW 15.8 (*)    All other components within normal limits  URINALYSIS, ROUTINE W REFLEX MICROSCOPIC - Abnormal; Notable for the following components:   APPearance HAZY (*)    Hgb urine dipstick SMALL (*)    Leukocytes,Ua LARGE (*)    All other components within normal limits  URINALYSIS, MICROSCOPIC (REFLEX) - Abnormal; Notable for the following components:   Bacteria, UA FEW (*)    Non Squamous Epithelial PRESENT (*)    All other components within normal limits  URINE CULTURE  LIPASE, BLOOD  PREGNANCY, URINE  GC/CHLAMYDIA PROBE AMP ([place]) NOT AT [facility]    EKG None  Radiology CT Abdomen Pelvis W Contrast  Result Date: 07/17/2021 CLINICAL DATA:  Right lower quadrant abdominal pain. EXAM: CT ABDOMEN AND PELVIS WITH CONTRAST TECHNIQUE: Multidetector  CT imaging of the abdomen and pelvis was performed using the standard protocol following bolus administration of intravenous contrast. CONTRAST:  119mL OMNIPAQUE IOHEXOL 300 MG/ML  SOLN COMPARISON:  CT of the abdomen and pelvis 10/29/2020 FINDINGS: Lower chest: Mild dependent atelectasis is present at both lung bases. Scarring is present in the inferior lingula. No nodule or mass lesion is present. Heart size is normal. No significant pleural or pericardial effusion is present. Hepatobiliary: No focal liver abnormality is seen. No gallstones, gallbladder wall thickening, or biliary dilatation. Pancreas: Unremarkable. No pancreatic ductal dilatation or surrounding inflammatory changes. Spleen: Normal in size without focal abnormality. Adrenals/Urinary Tract: The adrenal glands are normal bilaterally. Kidneys and ureters are within normal limits. No stone or mass lesion is present. No obstruction is present. The urinary bladder is within normal limits. Stomach/Bowel: The stomach and duodenum is within normal limits. Small bowel is unremarkable. Terminal ileum is within normal limits. The appendix is visualized and within normal limits. The ascending and transverse colon is within normal limits. Diverticular changes are present in the distal descending and sigmoid colon. No inflammatory changes are present to suggest diverticulitis. Vascular/Lymphatic: Atherosclerotic calcifications are present in the distal aorta branch vessels without aneurysm. Reproductive: Fluid is present in the lower uterine segment. No obstructing mass lesion is evident. Adnexa are within normal limits bilaterally. Other: Minimal free fluid in the pelvis is physiologic. No other free fluid or free air is present. No significant ventral hernias are present. Musculoskeletal: No acute or significant osseous findings. IMPRESSION: 1. No acute or focal lesion to explain the patient's right lower quadrant abdominal pain. Normal appendix. 2. Distal  descending and sigmoid diverticulosis without diverticulitis. 3. Fluid in the lower  uterine segment is likely physiologic. If the patient's pain persists, pelvic ultrasound may be useful for further evaluation. 4. Aortic Atherosclerosis (ICD10-I70.0). Electronically Signed   By: San Morelle M.D.   On: 07/17/2021 11:41   US PELVIC COMPLETE W TRANSVAGINAL AND TORSION R/O  Result Date: 07/17/2021 CLINICAL DATA:  Pelvic pain. Follow-up CT. History of multiple sclerosis. EXAM: TRANSABDOMINAL AND TRANSVAGINAL ULTRASOUND OF PELVIS DOPPLER ULTRASOUND OF OVARIES TECHNIQUE: Both transabdominal and transvaginal ultrasound examinations of the pelvis were performed. Transabdominal technique was performed for global imaging of the pelvis including uterus, ovaries, adnexal regions, and pelvic cul-de-sac. It was necessary to proceed with endovaginal exam following the transabdominal exam to visualize the endometrium and ovaries to better advantage. Color and duplex Doppler ultrasound was utilized to evaluate blood flow to the ovaries. COMPARISON:  Abdominopelvic CT 07/17/2021, 10/29/2020 and 08/11/2016. Pelvic ultrasound 12/21/2013. FINDINGS: Uterus Measurements: 6.8 x 3.0 x 4.1 cm = volume: 43.5 mL. No fibroids or other mass visualized. Endometrium Thickness: 3 mm. A small amount of fluid and echogenic material are noted within the endometrial and endocervical canals. Right ovary Measurements: 2.5 x 2.3 x 1.9 cm = volume: 5.6 mL. Normal appearance/no adnexal mass. Normal blood flow with color Doppler. Left ovary Measurements: 3.3 x 2.5 x 2.2 cm = volume: 9.7 mL. Single 2 cm follicle. No suspicious adnexal findings. Normal blood flow with color Doppler. Pulsed Doppler evaluation of both ovaries demonstrates normal low-resistance arterial and venous waveforms. Other findings Trace free pelvic fluid. IMPRESSION: 1. Nonspecific small amount of fluid within the endometrial and endocervical canals, as seen on earlier CT,  within physiologic limits. 2. The uterus and ovaries otherwise appear unremarkable. No evidence of ovarian torsion. Electronically Signed   By: Richardean Sale M.D.   On: 07/17/2021 14:09    Procedures Procedures   Medications Ordered in ED Medications  sodium chloride 0.9 % bolus 1,000 mL (0 mLs Intravenous Stopped 07/17/21 1332)  fentaNYL (SUBLIMAZE) injection 50 mcg (50 mcg Intravenous Given 07/17/21 1101)  ondansetron (ZOFRAN) injection 4 mg (4 mg Intravenous Given 07/17/21 1101)  iohexol (OMNIPAQUE) 300 MG/ML solution 100 mL (100 mLs Intravenous Contrast Given 07/17/21 1115)    ED Course  I have reviewed the triage vital signs and the nursing notes.  Pertinent labs & imaging results that were available during my care of the patient were reviewed by me and considered in my medical decision making (see chart for details).    MDM Rules/Calculators/A&P                         Patient is a 45 year old female who presents with abdominal pain.  It seems to be fairly generalized in nature.  No fevers.  No urinary symptoms.  She had a CT scan of abdomen pelvis which shows no acute abnormality.  She does have some vaginal discharge.  Wet prep is negative.  She states that she has not been sexually active in some time and doubts that she has an STD.  Her STD testing is pending but she does not want to be treated presumptively.  Her urine looks to be consistent with a dirty specimen but was sent for culture.  Pelvic ultrasound showed no acute abnormalities.  No evidence of torsion.  She was discharged home in good condition.  She was encouraged to follow-up with her PCP in the next couple days for recheck.  Return precautions were given.    Final Clinical Impression(s) / ED Diagnoses Final  diagnoses:  Generalized abdominal pain    Rx / DC Orders ED Discharge Orders     None        Malvin Johns, MD 07/17/21 [phone]

## 2021-07-18 LAB — URINE CULTURE: Culture: 20000 — AB

## 2021-07-18 LAB — GC/CHLAMYDIA PROBE AMP (~~LOC~~) NOT AT ARMC
Chlamydia: NEGATIVE
Comment: NEGATIVE
Comment: NORMAL
Neisseria Gonorrhea: NEGATIVE

## 2021-07-20 ENCOUNTER — Telehealth: Payer: Self-pay | Admitting: Emergency Medicine

## 2021-07-20 NOTE — Telephone Encounter (Signed)
Post ED Visit - Positive Culture Follow-up  Culture report reviewed by antimicrobial stewardship pharmacist: [place] Team []  Elenor Quinones, Pharm.D. []  Heide Guile, Pharm.D., BCPS AQ-ID []  Parks Neptune, Pharm.D., BCPS []  Alycia Rossetti, Pharm.D., BCPS []  [place], Pharm.D., BCPS, AAHIVP []  Legrand Como, Pharm.D., BCPS, AAHIVP []  Salome Arnt, PharmD, BCPS []  Johnnette Gourd, PharmD, BCPS []  Hughes Better, PharmD, BCPS [x]  Bertis Ruddy PharmD []  Laqueta Linden, PharmD, BCPS []  Albertina Parr, PharmD  [place] Team []  Leodis Sias, PharmD []  Lindell Spar, PharmD []  Royetta Asal, PharmD []  Graylin Shiver, Rph []  Rema Fendt) Glennon Mac, PharmD []  Arlyn Dunning, PharmD []  Netta Cedars, PharmD []  Dia Sitter, PharmD []  Leone Haven, PharmD []  Gretta Arab, PharmD []  Theodis Shove, PharmD []  Peggyann Juba, PharmD []  Reuel Boom, PharmD   Positive urine culture No further patient follow-up is required at this time. Domenic Moras, PA-C  Sandi Raveling Karcyn Menn 07/20/2021, 10:42 AM

## 2021-07-25 ENCOUNTER — Inpatient Hospital Stay: Payer: Medicare HMO | Attending: Hematology & Oncology

## 2021-07-25 ENCOUNTER — Other Ambulatory Visit: Payer: Self-pay

## 2021-07-25 VITALS — BP 112/63 | HR 73 | Temp 98.8°F | Resp 19

## 2021-07-25 DIAGNOSIS — N921 Excessive and frequent menstruation with irregular cycle: Secondary | ICD-10-CM

## 2021-07-25 DIAGNOSIS — D509 Iron deficiency anemia, unspecified: Secondary | ICD-10-CM | POA: Insufficient documentation

## 2021-07-25 DIAGNOSIS — D500 Iron deficiency anemia secondary to blood loss (chronic): Secondary | ICD-10-CM

## 2021-07-25 MED ORDER — SODIUM CHLORIDE 0.9 % IV SOLN: Freq: Once | INTRAVENOUS | Status: AC

## 2021-07-25 MED ORDER — SODIUM CHLORIDE 0.9 % IV SOLN
300.0000 mg | Freq: Once | INTRAVENOUS | Status: AC
  Administered 2021-07-25: 300 mg via INTRAVENOUS
  Filled 2021-07-25: qty 300

## 2021-07-25 NOTE — Patient Instructions (Signed)

## 2021-08-02 ENCOUNTER — Other Ambulatory Visit: Payer: Self-pay

## 2021-08-02 ENCOUNTER — Inpatient Hospital Stay: Payer: Medicare HMO

## 2021-08-02 VITALS — BP 103/77 | HR 83 | Temp 98.9°F | Resp 17

## 2021-08-02 DIAGNOSIS — D500 Iron deficiency anemia secondary to blood loss (chronic): Secondary | ICD-10-CM

## 2021-08-02 DIAGNOSIS — N921 Excessive and frequent menstruation with irregular cycle: Secondary | ICD-10-CM

## 2021-08-02 DIAGNOSIS — D509 Iron deficiency anemia, unspecified: Secondary | ICD-10-CM | POA: Diagnosis not present

## 2021-08-02 MED ORDER — SODIUM CHLORIDE 0.9 % IV SOLN
300.0000 mg | Freq: Once | INTRAVENOUS | Status: AC
  Administered 2021-08-02: 300 mg via INTRAVENOUS
  Filled 2021-08-02: qty 10

## 2021-08-02 MED ORDER — SODIUM CHLORIDE 0.9 % IV SOLN: Freq: Once | INTRAVENOUS | Status: AC

## 2021-08-02 NOTE — Patient Instructions (Signed)

## 2021-08-09 ENCOUNTER — Other Ambulatory Visit: Payer: Self-pay

## 2021-08-09 ENCOUNTER — Inpatient Hospital Stay: Payer: Medicare HMO

## 2021-08-09 VITALS — BP 111/56 | HR 63 | Temp 97.8°F | Resp 17

## 2021-08-09 DIAGNOSIS — N921 Excessive and frequent menstruation with irregular cycle: Secondary | ICD-10-CM

## 2021-08-09 DIAGNOSIS — D509 Iron deficiency anemia, unspecified: Secondary | ICD-10-CM | POA: Diagnosis not present

## 2021-08-09 DIAGNOSIS — D500 Iron deficiency anemia secondary to blood loss (chronic): Secondary | ICD-10-CM

## 2021-08-09 MED ORDER — SODIUM CHLORIDE 0.9 % IV SOLN
300.0000 mg | Freq: Once | INTRAVENOUS | Status: AC
  Administered 2021-08-09: 300 mg via INTRAVENOUS
  Filled 2021-08-09: qty 300

## 2021-08-09 MED ORDER — SODIUM CHLORIDE 0.9 % IV SOLN: Freq: Once | INTRAVENOUS | Status: AC

## 2021-10-30 ENCOUNTER — Encounter: Payer: Self-pay | Admitting: Family

## 2021-11-01 ENCOUNTER — Encounter: Payer: Self-pay | Admitting: Family Medicine

## 2021-11-01 ENCOUNTER — Telehealth (INDEPENDENT_AMBULATORY_CARE_PROVIDER_SITE_OTHER): Payer: Medicare Other | Admitting: Family Medicine

## 2021-11-01 DIAGNOSIS — J301 Allergic rhinitis due to pollen: Secondary | ICD-10-CM

## 2021-11-01 MED ORDER — MONTELUKAST SODIUM 10 MG PO TABS: 10.0000 mg | ORAL_TABLET | Freq: Every day | ORAL | 3 refills | Status: DC

## 2021-11-01 MED ORDER — LEVOCETIRIZINE DIHYDROCHLORIDE 5 MG PO TABS: 5.0000 mg | ORAL_TABLET | Freq: Every evening | ORAL | 2 refills | Status: DC

## 2021-11-01 MED ORDER — PREDNISONE 20 MG PO TABS: 40.0000 mg | ORAL_TABLET | Freq: Every day | ORAL | 0 refills | Status: AC

## 2021-11-01 NOTE — Progress Notes (Addendum)
Chief Complaint  ?Patient presents with  ? Allergies  ? ? ?Andrea Briggs here for URI complaints. Due to COVID-19 pandemic, we are interacting via web portal for an electronic face-to-face visit. I verified patient's ID using 2 identifiers. Patient agreed to proceed with visit via this method. Patient is at home, I am at office. Patient and I are present for visit.  ? ?Duration: 1 week  ?Associated symptoms: sinus congestion, rhinorrhea, itchy watery eyes, ear fullness, and sore throat ?Denies: sinus pain, ear pain, ear drainage, wheezing, shortness of breath, myalgia, and fevers, coughing ?Treatment to date: cetirizine not helpful ?Sick contacts: No ? ?Past Medical History:  ?Diagnosis Date  ? Anemia   ? Anxiety   ? Chronic kidney disease (CKD)   ? follows w nephro  ? Chronic pain   ? DDD (degenerative disc disease), cervical   ? DDD (degenerative disc disease), lumbar   ? Depression   ? Menorrhagia with irregular cycle 10/17/2020  ? Migraine   ? MS (multiple sclerosis) ([place])   ? Neuromuscular disorder ([place])   ? MS dx 2016  ? Vertigo   ? ? ?Objective ?No conversational dyspnea ?Age appropriate judgment and insight ?Nml affect and mood ? ?Seasonal allergic rhinitis due to pollen - Plan: levocetirizine (XYZAL) 5 MG tablet, montelukast (SINGULAIR) 10 MG tablet, predniSONE (DELTASONE) 20 MG tablet ? ?5 d pred burst 40 mg/d for immediate s/s's. Change Zyrtec to Xyzal. Start Singulair 10 mg/d.  ?F/u for CPE at convenience.  ?Total time: 12 min ?Pt voiced understanding and agreement to the plan. ? ?Shelda Pal, DO ?11/01/21 ?12:09 PM ? ?

## 2021-11-01 NOTE — Addendum Note (Signed)
Addended by: Ames Coupe on: 11/01/2021 12:11 PM ? ? Modules accepted: Orders, Level of Service ? ?

## 2021-11-27 DIAGNOSIS — Z5181 Encounter for therapeutic drug level monitoring: Secondary | ICD-10-CM | POA: Diagnosis not present

## 2021-11-27 DIAGNOSIS — M1990 Unspecified osteoarthritis, unspecified site: Secondary | ICD-10-CM | POA: Diagnosis not present

## 2021-11-27 DIAGNOSIS — M519 Unspecified thoracic, thoracolumbar and lumbosacral intervertebral disc disorder: Secondary | ICD-10-CM | POA: Diagnosis not present

## 2021-11-27 DIAGNOSIS — G35 Multiple sclerosis: Secondary | ICD-10-CM | POA: Diagnosis not present

## 2021-11-27 DIAGNOSIS — Z79899 Other long term (current) drug therapy: Secondary | ICD-10-CM | POA: Diagnosis not present

## 2021-12-11 DIAGNOSIS — G35 Multiple sclerosis: Secondary | ICD-10-CM | POA: Diagnosis not present

## 2021-12-11 DIAGNOSIS — R9082 White matter disease, unspecified: Secondary | ICD-10-CM | POA: Diagnosis not present

## 2021-12-17 ENCOUNTER — Telehealth (INDEPENDENT_AMBULATORY_CARE_PROVIDER_SITE_OTHER): Payer: Medicare Other | Admitting: Family Medicine

## 2021-12-17 ENCOUNTER — Encounter: Payer: Self-pay | Admitting: Family Medicine

## 2021-12-17 DIAGNOSIS — J069 Acute upper respiratory infection, unspecified: Secondary | ICD-10-CM

## 2021-12-17 MED ORDER — HYDROCODONE BIT-HOMATROP MBR 5-1.5 MG/5ML PO SOLN: 5.0000 mL | Freq: Three times a day (TID) | ORAL | 0 refills | Status: DC | PRN

## 2021-12-17 NOTE — Progress Notes (Signed)
Chief Complaint  Patient presents with   Cough   Sore Throat    Negative Covid test.     Andrea Briggs here for URI complaints. Due to COVID-19 pandemic, we are interacting via web portal for an electronic face-to-face visit. I verified patient's ID using 2 identifiers. Patient agreed to proceed with visit via this method. Patient is at home, I am at office. Patient and I are present for visit.   Duration: 2 days  Associated symptoms: sore throat and productive coughing Denies: sinus congestion, sinus pain, rhinorrhea, itchy watery eyes, ear pain, ear drainage, wheezing, shortness of breath, myalgia, and fevers Treatment to date: Nyquil, Tylenol Cold and Flu Sick contacts: No Tested neg for covid.   Past Medical History:  Diagnosis Date   Anemia    Anxiety    Chronic kidney disease (CKD)    follows w nephro   Chronic pain    DDD (degenerative disc disease), cervical    DDD (degenerative disc disease), lumbar    Depression    Menorrhagia with irregular cycle 10/17/2020   Migraine    MS (multiple sclerosis) ([place])    Neuromuscular disorder ([place])    MS dx 2016   Vertigo     Objective No conversational dyspnea Age appropriate judgment and insight Nml affect and mood  Viral URI with cough - Plan: HYDROcodone bit-homatropine (HYCODAN) 5-1.5 MG/5ML syrup  Above syrup to help w s/s's. URI has been going around schools. Warnings about syrup verbalized. She will send message if worsening.  Continue to push fluids, practice good hand hygiene, cover mouth when coughing. F/u prn. If starting to experience fevers, shaking, or shortness of breath, seek immediate care. Pt voiced understanding and agreement to the plan.  [place], DO 12/17/21 2:19 PM

## 2022-01-07 ENCOUNTER — Encounter: Payer: Self-pay | Admitting: Family Medicine

## 2022-01-07 ENCOUNTER — Telehealth (INDEPENDENT_AMBULATORY_CARE_PROVIDER_SITE_OTHER): Payer: Medicare Other | Admitting: Family Medicine

## 2022-01-07 ENCOUNTER — Other Ambulatory Visit: Payer: Self-pay | Admitting: Family Medicine

## 2022-01-07 DIAGNOSIS — B9689 Other specified bacterial agents as the cause of diseases classified elsewhere: Secondary | ICD-10-CM

## 2022-01-07 DIAGNOSIS — J208 Acute bronchitis due to other specified organisms: Secondary | ICD-10-CM | POA: Diagnosis not present

## 2022-01-07 MED ORDER — AZITHROMYCIN 250 MG PO TABS: ORAL_TABLET | ORAL | 0 refills | Status: DC

## 2022-01-07 MED ORDER — PREDNISONE 20 MG PO TABS: 40.0000 mg | ORAL_TABLET | Freq: Every day | ORAL | 0 refills | Status: AC

## 2022-01-07 NOTE — Progress Notes (Signed)
Chief Complaint  Patient presents with   Cough    hoarseness    Andrea Briggs here for URI complaints. Due to COVID-19 pandemic, we are interacting via web portal for an electronic face-to-face visit. I verified patient's ID using 2 identifiers. Patient agreed to proceed with visit via this method. Patient is at home, I am at office. Patient and I are present for visit.   Duration: 3 weeks; had initially improved since seeing me and then got worse again Associated symptoms: sinus congestion, rhinorrhea, ear fullness, and coughing Denies: sinus pain, itchy watery eyes, ear pain, ear drainage, wheezing, shortness of breath, myalgia, and fevers Treatment to date: Hydocan syrup Sick contacts: No  Past Medical History:  Diagnosis Date   Anemia    Anxiety    Chronic kidney disease (CKD)    follows w nephro   Chronic pain    DDD (degenerative disc disease), cervical    DDD (degenerative disc disease), lumbar    Depression    Menorrhagia with irregular cycle 10/17/2020   Migraine    MS (multiple sclerosis) ([place])    Neuromuscular disorder ([place])    MS dx 2016   Vertigo     Objective No conversational dyspnea Age appropriate judgment and insight Nml affect and mood  Acute bacterial bronchitis - Plan: azithromycin (ZITHROMAX) 250 MG tablet, predniSONE (DELTASONE) 20 MG tablet  Pt reports worsening after initial improvement form viral URI. Suspect bacterial infection given this and duration. Will tx w Zpak and prednisone given throat pain/swelling.  Continue to push fluids, practice good hand hygiene, cover mouth when coughing. F/u prn. If starting to experience fevers, shaking, or shortness of breath, seek immediate care. Pt voiced understanding and agreement to the plan.  [place], DO 01/07/22 2:26 PM

## 2022-01-14 ENCOUNTER — Ambulatory Visit: Payer: Self-pay | Admitting: Family

## 2022-01-14 ENCOUNTER — Other Ambulatory Visit: Payer: Self-pay

## 2022-01-17 ENCOUNTER — Inpatient Hospital Stay: Payer: Medicare Other | Attending: Hematology & Oncology

## 2022-01-17 ENCOUNTER — Other Ambulatory Visit: Payer: Self-pay

## 2022-01-17 ENCOUNTER — Encounter: Payer: Self-pay | Admitting: Family

## 2022-01-17 ENCOUNTER — Inpatient Hospital Stay (HOSPITAL_BASED_OUTPATIENT_CLINIC_OR_DEPARTMENT_OTHER): Payer: Medicare Other | Admitting: Family

## 2022-01-17 VITALS — BP 107/59 | HR 88 | Temp 98.3°F | Resp 18 | Ht 66.0 in | Wt 206.8 lb

## 2022-01-17 DIAGNOSIS — D500 Iron deficiency anemia secondary to blood loss (chronic): Secondary | ICD-10-CM

## 2022-01-17 DIAGNOSIS — R059 Cough, unspecified: Secondary | ICD-10-CM | POA: Insufficient documentation

## 2022-01-17 DIAGNOSIS — D509 Iron deficiency anemia, unspecified: Secondary | ICD-10-CM | POA: Diagnosis not present

## 2022-01-17 DIAGNOSIS — R5383 Other fatigue: Secondary | ICD-10-CM | POA: Diagnosis not present

## 2022-01-17 LAB — CBC WITH DIFFERENTIAL (CANCER CENTER ONLY)
Abs Immature Granulocytes: 0.03 10*3/uL (ref 0.00–0.07)
Basophils Absolute: 0 10*3/uL (ref 0.0–0.1)
Basophils Relative: 1 %
Eosinophils Absolute: 0.1 10*3/uL (ref 0.0–0.5)
Eosinophils Relative: 3 %
HCT: 41.3 % (ref 36.0–46.0)
Hemoglobin: 13 g/dL (ref 12.0–15.0)
Immature Granulocytes: 1 %
Lymphocytes Relative: 43 %
Lymphs Abs: 1.7 10*3/uL (ref 0.7–4.0)
MCH: 24.9 pg — ABNORMAL LOW (ref 26.0–34.0)
MCHC: 31.5 g/dL (ref 30.0–36.0)
MCV: 79.1 fL — ABNORMAL LOW (ref 80.0–100.0)
Monocytes Absolute: 0.4 10*3/uL (ref 0.1–1.0)
Monocytes Relative: 11 %
Neutro Abs: 1.6 10*3/uL — ABNORMAL LOW (ref 1.7–7.7)
Neutrophils Relative %: 41 %
Platelet Count: 304 10*3/uL (ref 150–400)
RBC: 5.22 MIL/uL — ABNORMAL HIGH (ref 3.87–5.11)
RDW: 15.9 % — ABNORMAL HIGH (ref 11.5–15.5)
WBC Count: 3.9 10*3/uL — ABNORMAL LOW (ref 4.0–10.5)
nRBC: 0 % (ref 0.0–0.2)

## 2022-01-17 LAB — FERRITIN: Ferritin: 79 ng/mL (ref 11–307)

## 2022-01-17 LAB — RETICULOCYTES
Immature Retic Fract: 6.4 % (ref 2.3–15.9)
RBC.: 5.19 MIL/uL — ABNORMAL HIGH (ref 3.87–5.11)
Retic Count, Absolute: 87.7 10*3/uL (ref 19.0–186.0)
Retic Ct Pct: 1.7 % (ref 0.4–3.1)

## 2022-01-17 NOTE — Progress Notes (Signed)
Hematology and Oncology Follow Up Visit  Andrea Briggs [id number] 11-Oct-1975 46 y.o. 01/17/2022   Principle Diagnosis:  Iron deficiency anemia    Current Therapy:        IV iron as indicated    Interim History:  Andrea Briggs is here today for follow-up. She is symptomatic with fatigue.  She states that she just completed an antibiotic for an upper respiratory infections. Her PCP is following this.  She still has a cough with brown phlegm. She notes that this does seem to be getting better.  No fever, chills, n/v, rash, dizziness, SOB, chest pain, palpitations, abdominal pain or changes in bowel or bladder habits.  No blood loss noted other than 3 day cycle with normal flow. No bruising or petechiae.  No swelling, tenderness, numbness or tingling in her extremities.  No falls or syncope.  Appetite and hydration are good. Her weight is stable at 206 lbs.   ECOG Performance Status: 1 - Symptomatic but completely ambulatory  Medications:  Allergies as of 01/17/2022       Reactions   Ketorolac Other (See Comments)   Renal insufficiency   Metoprolol Swelling   Nsaids Other (See Comments)   nephropathy        Medication List        Accurate as of January 17, 2022  2:39 PM. If you have any questions, ask your nurse or doctor.          azithromycin 250 MG tablet Commonly known as: ZITHROMAX Take 2 tabs the first day and then 1 tab daily until you run out.   folic acid 1 MG tablet Commonly known as: FOLVITE Take 1 tablet (1 mg total) by mouth daily.   HYDROcodone bit-homatropine 5-1.5 MG/5ML syrup Commonly known as: HYCODAN Take 5 mLs by mouth every 8 (eight) hours as needed for cough.   hydrOXYzine 25 MG tablet Commonly known as: ATARAX Take 1 tablet (25 mg total) by mouth 2 (two) times daily.   levocetirizine 5 MG tablet Commonly known as: XYZAL Take 1 tablet (5 mg total) by mouth every evening.   meloxicam 7.5 MG tablet Commonly known as: MOBIC Take 1 tablet  (7.5 mg total) by mouth daily.   montelukast 10 MG tablet Commonly known as: SINGULAIR Take 1 tablet (10 mg total) by mouth at bedtime.   multivitamin capsule Take 1 capsule by mouth daily.   OCRELIZUMAB IV Inject 1 Dose into the vein every 6 (six) months.   omeprazole 20 MG capsule Commonly known as: PRILOSEC Take 1 capsule (20 mg total) by mouth 2 (two) times daily before a meal.   solifenacin 5 MG tablet Commonly known as: VESICARE Take 1 tablet (5 mg total) by mouth daily.   tiZANidine 2 MG tablet Commonly known as: ZANAFLEX Take 1 tablet (2 mg total) by mouth 4 (four) times daily.   traZODone 50 MG tablet Commonly known as: DESYREL Take by mouth at bedtime as needed for sleep. Unsure dose   venlafaxine XR 150 MG 24 hr capsule Commonly known as: Effexor XR Take 1 capsule (150 mg total) by mouth daily with breakfast.   Vitamin D (Ergocalciferol) 1.25 MG (50000 UNIT) Caps capsule Commonly known as: DRISDOL Take 50,000 Units by mouth once a week.   zonisamide 100 MG capsule Commonly known as: ZONEGRAN Take 300 mg by mouth daily.        Allergies:  Allergies  Allergen Reactions   Ketorolac Other (See Comments)    Renal insufficiency  Metoprolol Swelling   Nsaids Other (See Comments)    nephropathy    Past Medical History, Surgical history, Social history, and Family History were reviewed and updated.  Review of Systems: All other 10 point review of systems is negative.   Physical Exam:  vitals were not taken for this visit.   Wt Readings from Last 3 Encounters:  07/17/21 201 lb (91.2 kg)  07/16/21 201 lb 0.6 oz (91.2 kg)  03/26/21 186 lb (84.4 kg)    Ocular: Sclerae unicteric, pupils equal, round and reactive to light Ear-nose-throat: Oropharynx clear, dentition fair Lymphatic: No cervical or supraclavicular adenopathy Lungs no rales or rhonchi, good excursion bilaterally Heart regular rate and rhythm, no murmur appreciated Abd soft,  nontender, positive bowel sounds MSK no focal spinal tenderness, no joint edema Neuro: non-focal, well-oriented, appropriate affect Breasts: Deferred   Lab Results  Component Value Date   WBC 3.1 (L) 07/17/2021   HGB 11.3 (L) 07/17/2021   HCT 35.7 (L) 07/17/2021   MCV 73.8 (L) 07/17/2021   PLT 278 07/17/2021   Lab Results  Component Value Date   FERRITIN 12 07/16/2021   IRON 40 07/16/2021   TIBC 321 07/16/2021   UIBC 281 07/16/2021   IRONPCTSAT 13 07/16/2021   Lab Results  Component Value Date   RETICCTPCT 1.1 07/16/2021   RBC 4.84 07/17/2021   No results found for: "KPAFRELGTCHN", "LAMBDASER", "KAPLAMBRATIO" No results found for: "IGGSERUM", "IGA", "IGMSERUM" No results found for: "TOTALPROTELP", "ALBUMINELP", "A1GS", "A2GS", "BETS", "BETA2SER", "GAMS", "MSPIKE", "SPEI"   Chemistry      Component Value Date/Time   NA 136 07/17/2021 1028   K 3.7 07/17/2021 1028   CL 106 07/17/2021 1028   CO2 23 07/17/2021 1028   BUN 15 07/17/2021 1028   CREATININE 0.85 07/17/2021 1028   CREATININE 1.05 (H) 03/14/2021 0950      Component Value Date/Time   CALCIUM 8.5 (L) 07/17/2021 1028   ALKPHOS 56 07/17/2021 1028   AST 15 07/17/2021 1028   AST 14 (L) 03/14/2021 0950   ALT 15 07/17/2021 1028   ALT 10 03/14/2021 0950   BILITOT 0.7 07/17/2021 1028   BILITOT 0.8 03/14/2021 0950       Impression and Plan: Andrea Briggs is a very pleasant 46 yo African American female with history of iron deficiency anemia with history of GI blood loss.  Iron studies are pending.  Follow-up in 6 months.   Andrea Dawson, NP 6/30/20232:39 PM

## 2022-01-20 LAB — IRON AND IRON BINDING CAPACITY (CC-WL,HP ONLY)
Iron: 99 ug/dL (ref 28–170)
Saturation Ratios: 37 % — ABNORMAL HIGH (ref 10.4–31.8)
TIBC: 272 ug/dL (ref 250–450)
UIBC: 173 ug/dL (ref 148–442)

## 2022-01-22 ENCOUNTER — Telehealth: Payer: Self-pay

## 2022-01-22 NOTE — Telephone Encounter (Signed)
Pt called inquiring about her iron studies, called patient back and informed her that her iron levels were good and she did not need an infusion per NP. Pt verbalized understanding and denies any questions or concerns.

## 2022-01-28 ENCOUNTER — Encounter (HOSPITAL_BASED_OUTPATIENT_CLINIC_OR_DEPARTMENT_OTHER): Payer: Self-pay | Admitting: Emergency Medicine

## 2022-01-28 ENCOUNTER — Other Ambulatory Visit: Payer: Self-pay

## 2022-01-28 DIAGNOSIS — R1030 Lower abdominal pain, unspecified: Secondary | ICD-10-CM | POA: Diagnosis not present

## 2022-01-28 DIAGNOSIS — R1032 Left lower quadrant pain: Secondary | ICD-10-CM | POA: Diagnosis not present

## 2022-01-28 DIAGNOSIS — M545 Low back pain, unspecified: Secondary | ICD-10-CM | POA: Diagnosis not present

## 2022-01-28 LAB — PREGNANCY, URINE: Preg Test, Ur: NEGATIVE

## 2022-01-28 NOTE — ED Triage Notes (Signed)
C/o lower back pain that started yesterday. States pain "wrap around" to abdomen. Denies urinary sx.

## 2022-01-29 ENCOUNTER — Emergency Department (HOSPITAL_BASED_OUTPATIENT_CLINIC_OR_DEPARTMENT_OTHER)
Admission: EM | Admit: 2022-01-29 | Discharge: 2022-01-29 | Disposition: A | Discharge status: Home / Self Care | Payer: Medicare Other | Attending: Emergency Medicine | Admitting: Emergency Medicine

## 2022-01-29 ENCOUNTER — Emergency Department (HOSPITAL_BASED_OUTPATIENT_CLINIC_OR_DEPARTMENT_OTHER): Payer: Medicare Other

## 2022-01-29 ENCOUNTER — Telehealth: Payer: Self-pay | Admitting: Family Medicine

## 2022-01-29 DIAGNOSIS — M545 Low back pain, unspecified: Secondary | ICD-10-CM | POA: Diagnosis not present

## 2022-01-29 DIAGNOSIS — R1030 Lower abdominal pain, unspecified: Secondary | ICD-10-CM | POA: Diagnosis not present

## 2022-01-29 DIAGNOSIS — R109 Unspecified abdominal pain: Secondary | ICD-10-CM

## 2022-01-29 LAB — URINALYSIS, MICROSCOPIC (REFLEX)

## 2022-01-29 LAB — CBC WITH DIFFERENTIAL/PLATELET
Abs Immature Granulocytes: 0.01 10*3/uL (ref 0.00–0.07)
Basophils Absolute: 0 10*3/uL (ref 0.0–0.1)
Basophils Relative: 1 %
Eosinophils Absolute: 0.2 10*3/uL (ref 0.0–0.5)
Eosinophils Relative: 4 %
HCT: 38.1 % (ref 36.0–46.0)
Hemoglobin: 12.2 g/dL (ref 12.0–15.0)
Immature Granulocytes: 0 %
Lymphocytes Relative: 48 %
Lymphs Abs: 2.1 10*3/uL (ref 0.7–4.0)
MCH: 25.1 pg — ABNORMAL LOW (ref 26.0–34.0)
MCHC: 32 g/dL (ref 30.0–36.0)
MCV: 78.4 fL — ABNORMAL LOW (ref 80.0–100.0)
Monocytes Absolute: 0.5 10*3/uL (ref 0.1–1.0)
Monocytes Relative: 12 %
Neutro Abs: 1.5 10*3/uL — ABNORMAL LOW (ref 1.7–7.7)
Neutrophils Relative %: 35 %
Platelets: 208 10*3/uL (ref 150–400)
RBC: 4.86 MIL/uL (ref 3.87–5.11)
RDW: 16.2 % — ABNORMAL HIGH (ref 11.5–15.5)
WBC: 4.4 10*3/uL (ref 4.0–10.5)
nRBC: 0 % (ref 0.0–0.2)

## 2022-01-29 LAB — URINALYSIS, ROUTINE W REFLEX MICROSCOPIC
Bilirubin Urine: NEGATIVE
Glucose, UA: NEGATIVE mg/dL
Ketones, ur: NEGATIVE mg/dL
Leukocytes,Ua: NEGATIVE
Nitrite: NEGATIVE
Protein, ur: NEGATIVE mg/dL
Specific Gravity, Urine: >=1.03 (ref 1.005–1.030)
pH: 5 (ref 5.0–8.0)

## 2022-01-29 LAB — LIPASE, BLOOD: Lipase: 24 U/L (ref 11–51)

## 2022-01-29 LAB — COMPREHENSIVE METABOLIC PANEL
ALT: 14 U/L (ref 0–44)
AST: 14 U/L — ABNORMAL LOW (ref 15–41)
Albumin: 3.6 g/dL (ref 3.5–5.0)
Alkaline Phosphatase: 40 U/L (ref 38–126)
Anion gap: 7 (ref 5–15)
BUN: 16 mg/dL (ref 6–20)
CO2: 19 mmol/L — ABNORMAL LOW (ref 22–32)
Calcium: 8.4 mg/dL — ABNORMAL LOW (ref 8.9–10.3)
Chloride: 112 mmol/L — ABNORMAL HIGH (ref 98–111)
Creatinine, Ser: 1.01 mg/dL — ABNORMAL HIGH (ref 0.44–1.00)
GFR, Estimated: >60 mL/min (ref >60)
Glucose, Bld: 86 mg/dL (ref 70–99)
Potassium: 3.5 mmol/L (ref 3.5–5.1)
Sodium: 138 mmol/L (ref 135–145)
Total Bilirubin: 1.6 mg/dL — ABNORMAL HIGH (ref 0.3–1.2)
Total Protein: 6.1 g/dL — ABNORMAL LOW (ref 6.5–8.1)

## 2022-01-29 MED ORDER — ACETAMINOPHEN 325 MG PO TABS
650.0000 mg | ORAL_TABLET | Freq: Once | ORAL | Status: AC
  Administered 2022-01-29: 650 mg via ORAL
  Filled 2022-01-29: qty 2

## 2022-01-29 MED ORDER — SODIUM CHLORIDE 0.9 % IV BOLUS
500.0000 mL | Freq: Once | INTRAVENOUS | Status: AC
Start: 2022-01-29 — End: 2022-01-29
  Administered 2022-01-29: 500 mL via INTRAVENOUS

## 2022-01-29 MED ORDER — SUCRALFATE 1 G PO TABS: 1.0000 g | ORAL_TABLET | Freq: Three times a day (TID) | ORAL | 0 refills | Status: DC

## 2022-01-29 MED ORDER — ACETAMINOPHEN 325 MG PO TABS
650.0000 mg | ORAL_TABLET | Freq: Four times a day (QID) | ORAL | 0 refills | Status: AC | PRN
Start: 2022-01-29 — End: ?

## 2022-01-29 MED ORDER — HYDROXYZINE HCL 25 MG PO TABS: 25.0000 mg | ORAL_TABLET | Freq: Two times a day (BID) | ORAL | 0 refills | Status: DC

## 2022-01-29 MED ORDER — TIZANIDINE HCL 2 MG PO TABS: 2.0000 mg | ORAL_TABLET | Freq: Four times a day (QID) | ORAL | 0 refills | Status: DC

## 2022-01-29 NOTE — Discharge Instructions (Addendum)

## 2022-01-29 NOTE — Telephone Encounter (Signed)
Refill done.  

## 2022-01-29 NOTE — ED Provider Notes (Signed)
Provider Note MRN:  [id number]  Arrival date & time: 01/29/22    ED Course and Medical Decision Making  Assumed care from REES at shift change.  See not from prior team for complete details, in brief:   46 yo female Hx of MS, unchanged from baseline; CKD at baseline today B/L LBP to abdomen LLQ Given apap Unclear source of pain CT pending Labs stable XR WNL    Plan per prior physician CT  Re-assessed the pt, reports she does not want to stay in the ED any longer for CT. She is hungry and ready to go home. She is tolerating PO w/o difficulty, abd is soft/NT/non peritoneal. No n/v. She is HDS. Unclear etiology of her discomfort but at this time doubt acute emergent condition. Will plan to dc the pt with close return precautions  The patient's overall condition has improved, the patient presents with abdominal pain without signs of peritonitis, or other life-threatening serious etiology. The patient understands that at this time there is no evidence for a more malignant underlying process, but the patient also understands that early in the process of an illness, an emergency department workup can be falsely reassuring. Detailed discussions were had with the patient regarding current findings, and need for close f/u with PCP or on call doctor. The patient appears stable for discharge and has been instructed to return immediately if the symptoms worsen in any way, or in 24hr if not improved for re-evaluation. Patient verbalized understanding and is in agreement with current care plan.  All questions answered prior to discharge.   Procedures  Final Clinical Impressions(s) / ED Diagnoses     ICD-10-CM   1. Abdominal pain, unspecified abdominal location  R10.9       ED Discharge Orders          Ordered    acetaminophen (TYLENOL) 325 MG tablet  Every 6 hours PRN        01/29/22 0717    sucralfate (CARAFATE) 1 g tablet  3 times daily with meals & bedtime        01/29/22 0717               Discharge Instructions      There are many causes of abdominal pain. Most pain is not serious and goes away, but some pain gets worse, changes, or will not go away. Please return to the emergency department or see your doctor right away if you (or your family member) experience any of the following:  1. Pain that gets worse or moves to just one spot.  2. Pain that gets worse if you cough or sneeze.  3. Pain with going over a bump in the road.  4. Pain that does not get better in 24 hours.  5. Inability to keep down liquids (vomiting)-especially if you are making less urine.  6. Fainting.  7. Blood in the vomit or stool.  8. High fever or shaking chills.  9. Swelling of the abdomen.  10. Any new or worsening problem.      Follow-up Instructions  See your primary care provider if not completely better in the next 2-3 days. Come to the ED if you are unable to see them in this time frame.    Additional Instructions  No alcohol.  No caffeine, aspirin, or cigarettes.   Please return to the emergency department immediately for any new or concerning symptoms, or if you get worse.        Pearline Cables,  Arlan Organ, DO 01/29/22 [phone]

## 2022-01-29 NOTE — Telephone Encounter (Signed)
Medication: hydrOXYzine (ATARAX/VISTARIL) 25 MG tablet  tiZANidine (ZANAFLEX) 2 MG tablet  Has the patient contacted their pharmacy? Yes.    Preferred Pharmacy:  [facility], [place] - [address]   [place], [place] [address]  Phone:  [phone]  Fax:  [phone]

## 2022-01-29 NOTE — ED Provider Notes (Signed)
[facility] EMERGENCY DEPARTMENT Provider Note   CSN: [id number] Arrival date & time: 01/28/22  2325     History  Chief Complaint  Patient presents with   Back Pain    Andrea Briggs is a 46 y.o. female.  The history is provided by the patient and medical records.  Back Pain Andrea Briggs is a 46 y.o. female who presents to the Emergency Department complaining of back pain.  She presents to the emergency department complaining of bilateral low back pain that radiates to her lower abdomen bilaterally.  Pain is described as sharp, constant and worse with movement.  No reports of recent injuries.  No associated fever, nausea, vomiting.  Has MS - has chronic weakness in legs, unchanged from baseline.  No change in urination.  No dysuria.   Last dose of ocrelizumab in May -taking for 2.5 years.      Home Medications Prior to Admission medications   Medication Sig Start Date End Date Taking? Authorizing Provider  folic acid (FOLVITE) 1 MG tablet Take 1 tablet (1 mg total) by mouth daily. 01/09/21   Celso Amy, NP  hydrOXYzine (ATARAX/VISTARIL) 25 MG tablet Take 1 tablet (25 mg total) by mouth 2 (two) times daily. 02/11/21   Shelda Pal, DO  levocetirizine (XYZAL) 5 MG tablet Take 1 tablet (5 mg total) by mouth every evening. 11/01/21   Shelda Pal, DO  meloxicam (MOBIC) 7.5 MG tablet Take 1 tablet (7.5 mg total) by mouth daily. 10/10/20   Shelda Pal, DO  montelukast (SINGULAIR) 10 MG tablet Take 1 tablet (10 mg total) by mouth at bedtime. 11/01/21   Shelda Pal, DO  Multiple Vitamin (MULTIVITAMIN) capsule Take 1 capsule by mouth daily.    [provider]  OCRELIZUMAB IV Inject 1 Dose into the vein every 6 (six) months.    [provider]  omeprazole (PRILOSEC) 20 MG capsule Take 1 capsule (20 mg total) by mouth 2 (two) times daily before a meal. 10/22/20   Cirigliano, Vito V, DO  solifenacin (VESICARE) 5  MG tablet Take 1 tablet (5 mg total) by mouth daily. 03/29/21   Shelda Pal, DO  tiZANidine (ZANAFLEX) 2 MG tablet Take 1 tablet (2 mg total) by mouth 4 (four) times daily. 02/11/21   Shelda Pal, DO  traZODone (DESYREL) 50 MG tablet Take by mouth at bedtime as needed for sleep. Unsure dose    [provider]  venlafaxine XR (EFFEXOR XR) 150 MG 24 hr capsule Take 1 capsule (150 mg total) by mouth daily with breakfast. 10/31/20   Wendling, Crosby Oyster, DO  Vitamin D, Ergocalciferol, (DRISDOL) 1.25 MG (50000 UNIT) CAPS capsule Take 50,000 Units by mouth once a week. 01/09/20   [provider]  zonisamide (ZONEGRAN) 100 MG capsule Take 300 mg by mouth daily.    [provider]      Allergies    Ketorolac, Metoprolol, and Nsaids    Review of Systems   Review of Systems  Musculoskeletal:  Positive for back pain.  All other systems reviewed and are negative.   Physical Exam Updated Vital Signs BP 104/67   Pulse 67   Temp 97.9 F (36.6 C) (Oral)   Resp 16   Ht '5\' 6"'$  (1.676 m)   Wt 93.4 kg   LMP 01/14/2022 (Exact Date)   SpO2 99%   BMI 33.25 kg/m  Physical Exam Vitals and nursing note reviewed.  Constitutional:  Appearance: She is well-developed.  HENT:     Head: Normocephalic and atraumatic.  Cardiovascular:     Rate and Rhythm: Normal rate and regular rhythm.     Heart sounds: No murmur heard. Pulmonary:     Effort: Pulmonary effort is normal. No respiratory distress.     Breath sounds: Normal breath sounds.  Abdominal:     Palpations: Abdomen is soft.     Tenderness: There is no guarding or rebound.     Comments: Mild to moderate generalized abdominal tenderness.  Bilateral CVA tenderness  Musculoskeletal:        General: No tenderness.     Comments: 2+ DP pulses  Skin:    General: Skin is warm and dry.  Neurological:     Mental Status: She is alert and oriented to person, place, and time.     Comments: 5/5 strength  in BLE with sensation to light touch intact in BLE.   Psychiatric:        Behavior: Behavior normal.     ED Results / Procedures / Treatments   Labs (all labs ordered are listed, but only abnormal results are displayed) Labs Reviewed  URINALYSIS, ROUTINE W REFLEX MICROSCOPIC - Abnormal; Notable for the following components:      Result Value   Hgb urine dipstick MODERATE (*)    All other components within normal limits  URINALYSIS, MICROSCOPIC (REFLEX) - Abnormal; Notable for the following components:   Bacteria, UA FEW (*)    All other components within normal limits  PREGNANCY, URINE    EKG None  Radiology DG Lumbar Spine Complete  Result Date: 01/29/2022 CLINICAL DATA:  Low back pain EXAM: LUMBAR SPINE - COMPLETE 4+ VIEW COMPARISON:  CT 07/17/2021 FINDINGS: There is no evidence of lumbar spine fracture. Alignment is normal. Intervertebral disc spaces are maintained. Aortic atherosclerosis. Minimal facet degenerative change of the lower lumbar spine. IMPRESSION: Minimal facet degenerative change of the lower lumbar spine Electronically Signed   By: Donavan Foil M.D.   On: 01/29/2022 00:28    Procedures Procedures    Medications Ordered in ED Medications - No data to display  ED Course/ Medical Decision Making/ A&P                           Medical Decision Making Amount and/or Complexity of Data Reviewed Labs: ordered. Radiology: ordered.   Patient with history of MS on chronic medications here for evaluation of bilateral low back pain radiating to her abdomen bilaterally.  On initial evaluation patient with mild generalized abdominal tenderness, no focal neurologic deficits.  On repeat assessment patient with worsening pain, greatest over the left lower quadrant.  CMP with very mild elevation in her creatinine, similar when compared to priors.  CBC is stable when compared to priors with stable neutropenia.  Given patient's worsening abdominal pain, relative  immunosuppression plan to obtain CT abdomen pelvis to further evaluate her pain.  Patient care transferred pending additional imaging.        Final Clinical Impression(s) / ED Diagnoses Final diagnoses:  None    Rx / DC Orders ED Discharge Orders     None         Quintella Reichert, MD 01/29/22 [phone]

## 2022-02-13 IMAGING — CT CT ABD-PELV W/ CM
2 of 5 series · 15 of 46 positions shown, 17 images · IV contrast (omnipaque)
Comparison: 11/03/2017

CLINICAL DATA: Epigastric pain and unintentional weight loss,
history of peptic ulcer disease. Anemia.

EXAM:
CT ABDOMEN AND PELVIS WITH CONTRAST
TECHNIQUE: Multidetector CT imaging of the abdomen and pelvis was performed
using the standard protocol following bolus administration of
intravenous contrast.
CONTRAST:  100mL OMNIPAQUE IOHEXOL 300 MG/ML  SOLN

[Series 2: axial st · axial · 0.98mm/px · z∈[-486,-46]mm · 12 of 102 slices shown, 14 images]
[im 7/102  soft-tissue]
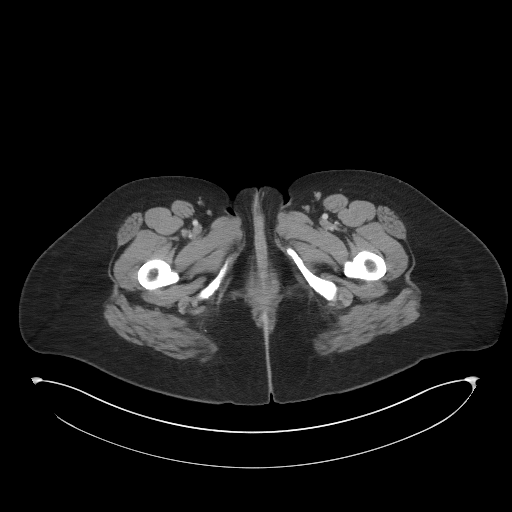
[im 7/102  bone]
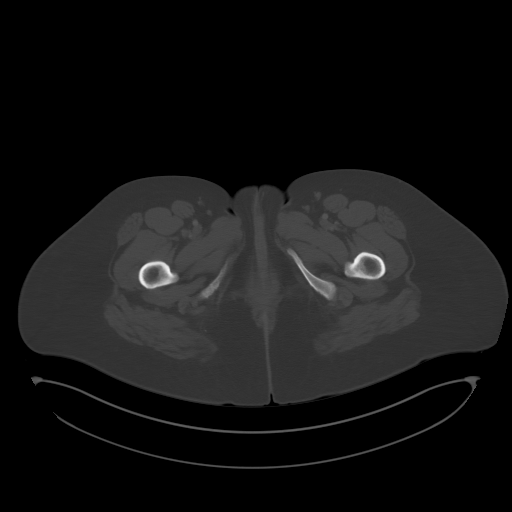
[im 14/102  soft-tissue]
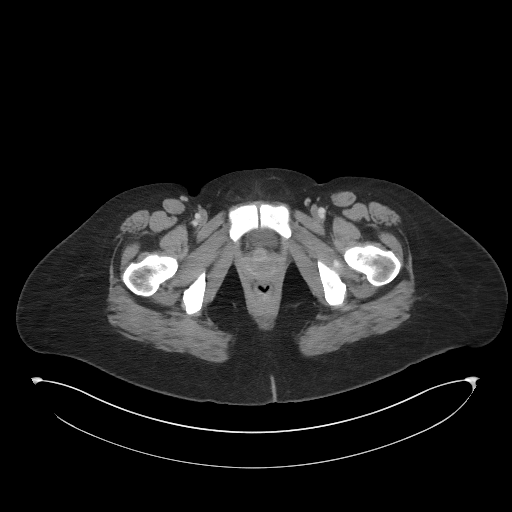
[im 21/102  soft-tissue]
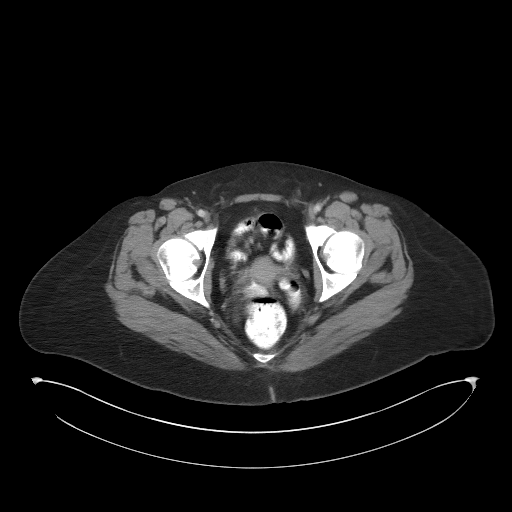
[im 34/102  soft-tissue]
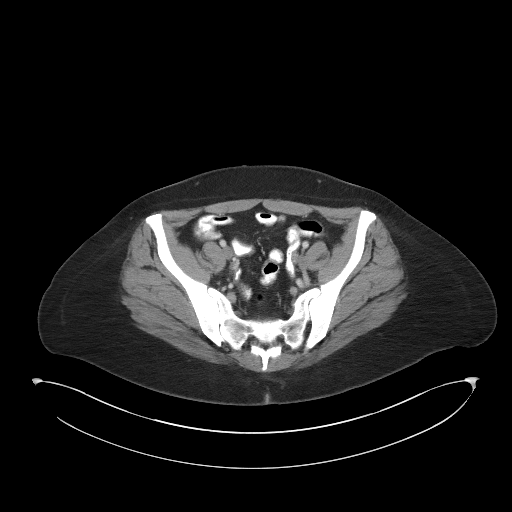
[im 41/102  soft-tissue]
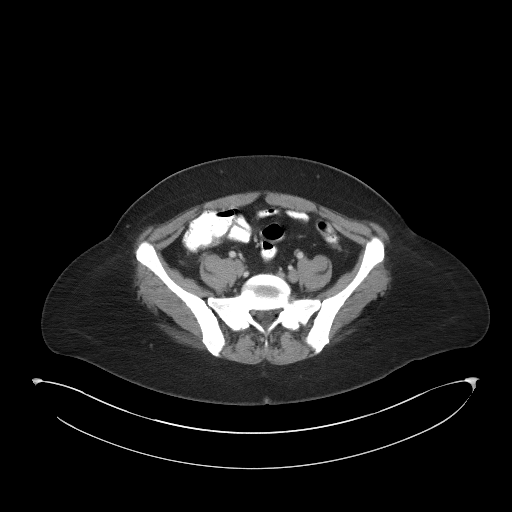
[im 48/102  soft-tissue]
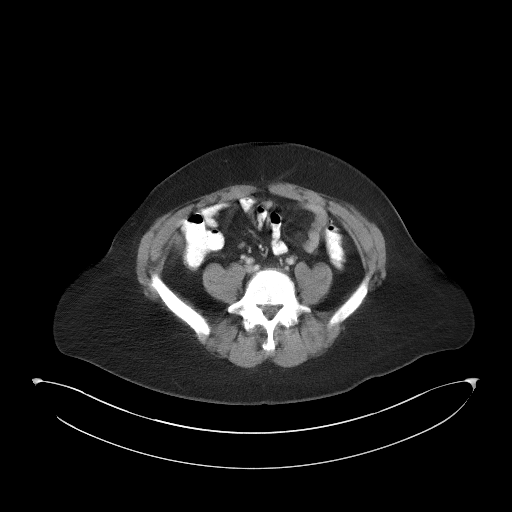
[im 54/102  soft-tissue]
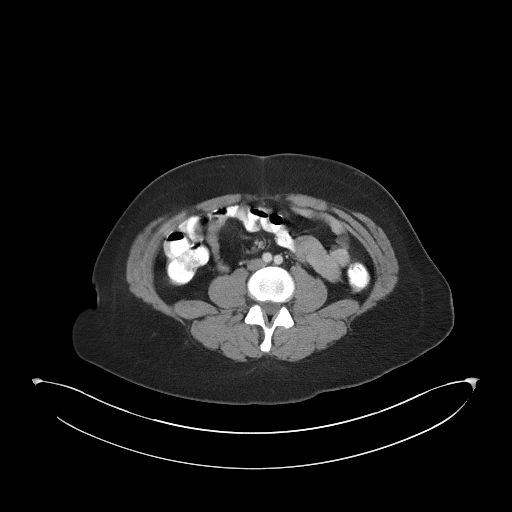
[im 61/102  soft-tissue]
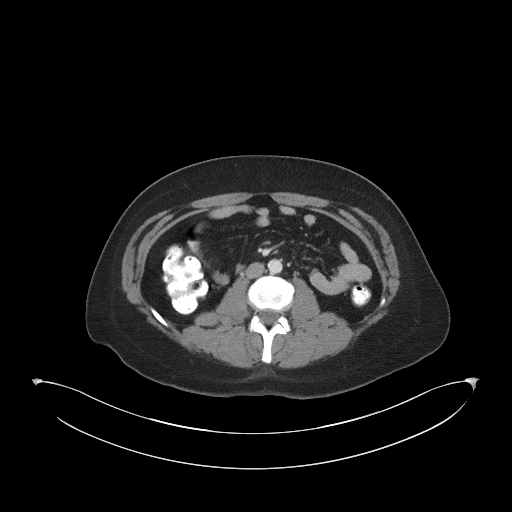
[im 68/102  soft-tissue]
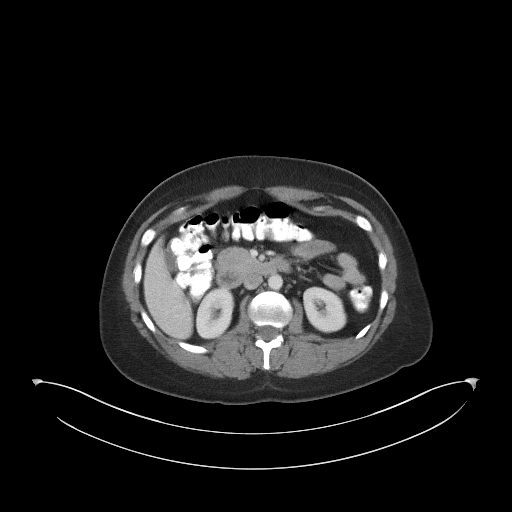
[im 68/102  bone]
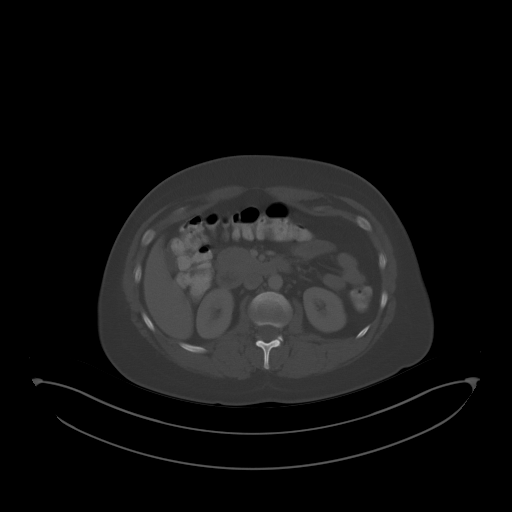
[im 81/102  soft-tissue]
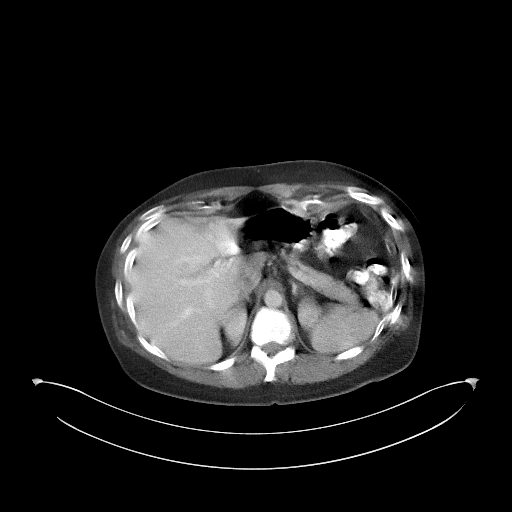
[im 88/102  soft-tissue]
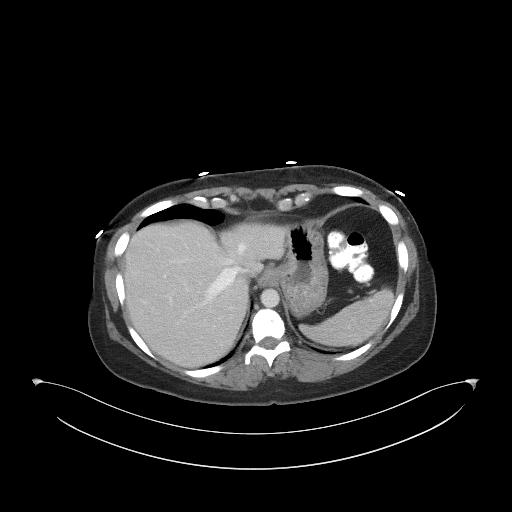
[im 95/102  soft-tissue]
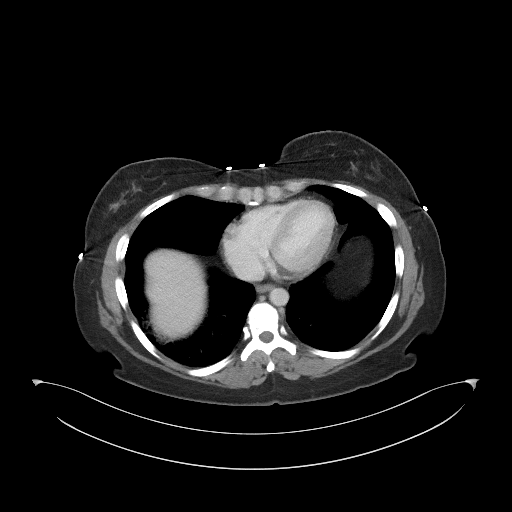

[Series 5: coronal st · coronal · 0.80mm/px · 3 of 84 slices shown]
[im 28/84  soft-tissue]
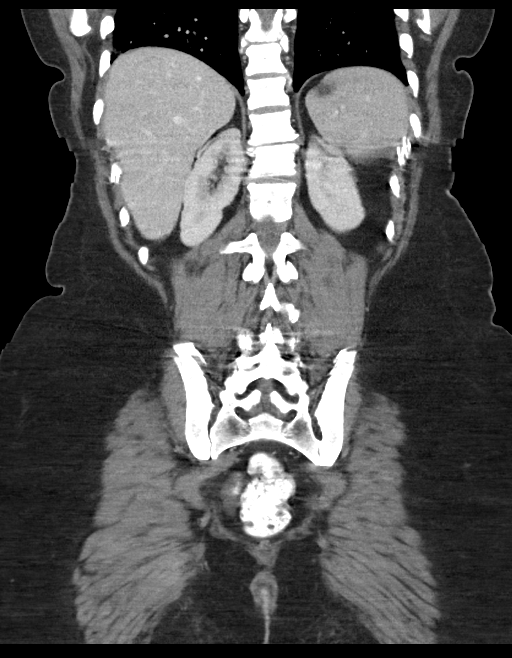
[im 37/84  soft-tissue]
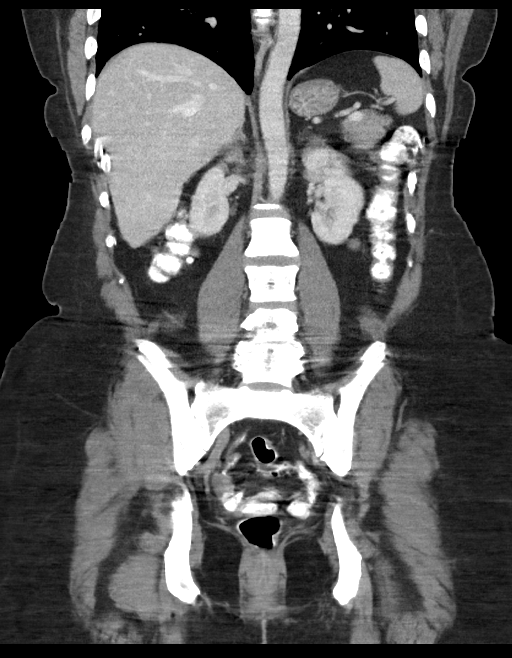
[im 47/84  soft-tissue]
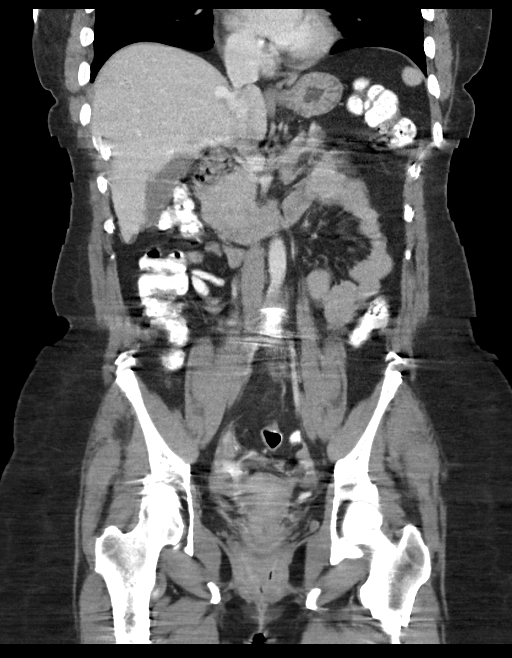

[15 of 46 positions shown; findings below may reference images not displayed]

FINDINGS: Despite efforts by the technologist and patient, motion artifact is
present on today's exam and could not be eliminated. This reduces
exam sensitivity and specificity.

Lower chest: Indistinctly marginated ground-glass opacity
posteriorly in the right middle lobe for example on image 3 series
4, potentially from alveolitis. Mild mosaic attenuation at the lung
bases.

Hepatobiliary: A 0.9 by 0.6 cm lesion in the dome of the liver on
image 10 of series 2 was previously diffusely enhancing in 3020 and
currently is mildly hypodense, but likely represents the same lesion
and possibly a small hemangioma or similar benign lesion.
Gallbladder unremarkable. No biliary dilatation.

Pancreas: Mild fullness of the pancreatic head contour is not
appreciably changed from 11/03/2017 and likely represents normal
variation.

Spleen: Unremarkable

Adrenals/Urinary Tract: 5 mm hypodense lesion in the right kidney
lower pole is stable from the prior exam and likely a small cyst.
Adrenal glands unremarkable.

Stomach/Bowel: Scattered diverticula in the descending colon which
do not appear inflamed. Normal appendix.

Vascular/Lymphatic: Aortoiliac atherosclerotic vascular disease. No
pathologic adenopathy.

Reproductive: Unremarkable

Other: Trace amount of free pelvic fluid in the cul-de-sac, possibly
physiologic but technically nonspecific.

Musculoskeletal: Unremarkable
IMPRESSION: 1. Mild ground-glass density posteromedially in the right middle
lobe is nonspecific and could be from atelectasis or alveolitis.
There is some mild mosaic attenuation in the lung bases, possibly
from air trapping or extrinsic allergic alveolitis.
2. A specific cause for the patient's unintentional weight loss and
epigastric pain is not identified.
3. Tiny lesions in the liver and right kidney lower pole are highly
likely to be benign and were present on 11/03/2017.
4. Scattered diverticula in the descending colon.
5.  Aortic Atherosclerosis ([id number]).
6. Trace free fluid in the cul-de-sac, possibly physiologic but
technically nonspecific.

## 2022-03-06 ENCOUNTER — Telehealth: Payer: Self-pay | Admitting: Family Medicine

## 2022-03-06 NOTE — Telephone Encounter (Signed)
Patient would like prescription for nausea to be called in to  [facility] [place] Phone:  [phone]  Fax:  [phone]

## 2022-03-06 NOTE — Telephone Encounter (Signed)
Unable to just send a prescription for nausea, if she is feeling unwell she needs a visit please.

## 2022-03-07 ENCOUNTER — Telehealth (INDEPENDENT_AMBULATORY_CARE_PROVIDER_SITE_OTHER): Payer: Medicare Other | Admitting: Family Medicine

## 2022-03-07 ENCOUNTER — Encounter: Payer: Self-pay | Admitting: Family Medicine

## 2022-03-07 DIAGNOSIS — R110 Nausea: Secondary | ICD-10-CM | POA: Diagnosis not present

## 2022-03-07 DIAGNOSIS — G35D Multiple sclerosis, unspecified: Secondary | ICD-10-CM

## 2022-03-07 DIAGNOSIS — G35 Multiple sclerosis: Secondary | ICD-10-CM | POA: Diagnosis not present

## 2022-03-07 DIAGNOSIS — J301 Allergic rhinitis due to pollen: Secondary | ICD-10-CM

## 2022-03-07 MED ORDER — FLUTICASONE PROPIONATE 50 MCG/ACT NA SUSP: 2.0000 | Freq: Every day | NASAL | 2 refills | Status: DC

## 2022-03-07 MED ORDER — ONDANSETRON 4 MG PO TBDP: 4.0000 mg | ORAL_TABLET | Freq: Three times a day (TID) | ORAL | 1 refills | Status: DC | PRN

## 2022-03-07 MED ORDER — MONTELUKAST SODIUM 10 MG PO TABS: 10.0000 mg | ORAL_TABLET | Freq: Every day | ORAL | 3 refills | Status: DC

## 2022-03-07 NOTE — Telephone Encounter (Signed)
Spoke to pt, appt made for 08/18.

## 2022-03-07 NOTE — Progress Notes (Signed)
Chief Complaint  Patient presents with   Nausea    2 weeks     Subjective Andrea Briggs is a 46 y.o. female who presents with nausea. Due to COVID-19 pandemic, we are interacting via web portal for an electronic face-to-face visit. I verified patient's ID using 2 identifiers. Patient agreed to proceed with visit via this method. Patient is at home, I am at office. Patient and I are present for visit.   Symptoms began 2 weeks ago.  Hx of MS, has gagging that induces the nausea. Neuro told her this can happen as part of dz.  Patient has nausea in AM, gagging; sometimes will get in the afternoon Patient denies abdominal pain, vomiting, diarrhea, and fever Treatment to date: OTC nausea relief Sick contacts: none known  Past Medical History:  Diagnosis Date   Anemia    Anxiety    Chronic kidney disease (CKD)    follows w nephro   Chronic pain    DDD (degenerative disc disease), cervical    DDD (degenerative disc disease), lumbar    Depression    Menorrhagia with irregular cycle 10/17/2020   Migraine    MS (multiple sclerosis) ([place])    Neuromuscular disorder ([place])    MS dx 2016   Vertigo     Exam No conversational dyspnea Age appropriate judgment and insight Nml affect and mood  Assessment and Plan  Nausea  MS (multiple sclerosis) ([place])  Seasonal allergic rhinitis due to pollen - Plan: montelukast (SINGULAIR) 10 MG tablet  1/2. Chronic condition causing this uncontrolled issue. Zofran prn.  3. Chronic, uncontrolled. Go on INCS, cont Xyzal. Add Singulair 10 mg/d if no improvement. F/u prn.  The patient voiced understanding and agreement to the plan.  [place], DO 03/07/22  10:35 AM

## 2022-03-24 ENCOUNTER — Encounter (HOSPITAL_BASED_OUTPATIENT_CLINIC_OR_DEPARTMENT_OTHER): Payer: Self-pay | Admitting: Emergency Medicine

## 2022-03-24 ENCOUNTER — Other Ambulatory Visit: Payer: Self-pay

## 2022-03-24 ENCOUNTER — Emergency Department (HOSPITAL_BASED_OUTPATIENT_CLINIC_OR_DEPARTMENT_OTHER)
Admission: EM | Admit: 2022-03-24 | Discharge: 2022-03-24 | Disposition: A | Discharge status: Home / Self Care | Payer: Medicare Other | Attending: Emergency Medicine | Admitting: Emergency Medicine

## 2022-03-24 DIAGNOSIS — N189 Chronic kidney disease, unspecified: Secondary | ICD-10-CM | POA: Diagnosis not present

## 2022-03-24 DIAGNOSIS — B029 Zoster without complications: Secondary | ICD-10-CM | POA: Diagnosis not present

## 2022-03-24 DIAGNOSIS — R21 Rash and other nonspecific skin eruption: Secondary | ICD-10-CM | POA: Diagnosis not present

## 2022-03-24 MED ORDER — KETOROLAC TROMETHAMINE 60 MG/2ML IM SOLN
60.0000 mg | Freq: Once | INTRAMUSCULAR | Status: AC
Start: 2022-03-24 — End: 2022-03-24
  Administered 2022-03-24: 60 mg via INTRAMUSCULAR
  Filled 2022-03-24: qty 2

## 2022-03-24 MED ORDER — VALACYCLOVIR HCL 1 G PO TABS: 1000.0000 mg | ORAL_TABLET | Freq: Three times a day (TID) | ORAL | 0 refills | Status: DC

## 2022-03-24 MED ORDER — GABAPENTIN 100 MG PO CAPS: 100.0000 mg | ORAL_CAPSULE | Freq: Three times a day (TID) | ORAL | 0 refills | Status: DC | PRN

## 2022-03-24 MED ORDER — GABAPENTIN 300 MG PO CAPS
300.0000 mg | ORAL_CAPSULE | Freq: Once | ORAL | Status: AC
  Administered 2022-03-24: 300 mg via ORAL
  Filled 2022-03-24: qty 1

## 2022-03-24 MED ORDER — OXYCODONE HCL 5 MG PO TABS: 5.0000 mg | ORAL_TABLET | Freq: Four times a day (QID) | ORAL | 0 refills | Status: DC | PRN

## 2022-03-24 MED ORDER — GABAPENTIN 600 MG PO TABS: 300.0000 mg | ORAL_TABLET | Freq: Once | ORAL | Status: DC

## 2022-03-24 MED ORDER — VALACYCLOVIR HCL 500 MG PO TABS
1000.0000 mg | ORAL_TABLET | Freq: Once | ORAL | Status: AC
Start: 2022-03-24 — End: 2022-03-24
  Administered 2022-03-24: 1000 mg via ORAL
  Filled 2022-03-24: qty 2

## 2022-03-24 NOTE — Discharge Instructions (Addendum)
You have shingles.  Take the antiviral medication to treat the shingles.  Take Tylenol and ibuprofen as needed for pain control as well as the oxycodone for severe pain and the gabapentin for nerve pain.  Come back if any severe worsening rash, high fevers, altered mental status, new cough and shortness of breath, or any other symptoms concerning to you.

## 2022-03-24 NOTE — ED Triage Notes (Signed)
Patient presents to ED via POV from home. Here with rash to left side of abdomen, wrapping around to left side of her back. Expresses concern for shingles. Endorses severe pain to same.

## 2022-03-24 NOTE — ED Triage Notes (Signed)
Pt also reports voice hoarseness that began 2 weeks ago and not getting better

## 2022-03-24 NOTE — ED Provider Notes (Signed)
[facility] Provider Note   CSN: [id number] Arrival date & time: 03/24/22  1836     History  Chief Complaint  Patient presents with   Rash    Andrea Briggs is a 46 y.o. female.  With PMH of MS, CKD, fibromyalgia who presents with painful rash.  Patient developed painful rash that started about 2 days ago that is on her back spreading around to her front of her abdomen.  Not crossing midline.  Severely painful sharp and burning in nature.  She is also had associated malaise and generally feeling unwell.  She says it is extremely painful to the touch.  She was concerned it was shingles.  No fevers, no cough, no shortness of breath, no headache, neck pain or mental status changes.  She did have chickenpox as a child.   Rash      Home Medications Prior to Admission medications   Medication Sig Start Date End Date Taking? Authorizing Provider  gabapentin (NEURONTIN) 100 MG capsule Take 1 capsule (100 mg total) by mouth 3 (three) times daily as needed (pain). 03/24/22 04/23/22 Yes Elgie Congo, MD  oxyCODONE (ROXICODONE) 5 MG immediate release tablet Take 1 tablet (5 mg total) by mouth every 6 (six) hours as needed for up to 10 doses for severe pain. 03/24/22  Yes Elgie Congo, MD  valACYclovir (VALTREX) 1000 MG tablet Take 1 tablet (1,000 mg total) by mouth 3 (three) times daily. 03/24/22  Yes Elgie Congo, MD  acetaminophen (TYLENOL) 325 MG tablet Take 2 tablets (650 mg total) by mouth every 6 (six) hours as needed. 01/29/22   Jeanell Sparrow, DO  fluticasone (FLONASE) 50 MCG/ACT nasal spray Place 2 sprays into both nostrils daily. 03/07/22   Shelda Pal, DO  folic acid (FOLVITE) 1 MG tablet Take 1 tablet (1 mg total) by mouth daily. 01/09/21   Celso Amy, NP  hydrOXYzine (ATARAX) 25 MG tablet Take 1 tablet (25 mg total) by mouth 2 (two) times daily. 01/29/22   Shelda Pal, DO  levocetirizine (XYZAL) 5 MG tablet Take  1 tablet (5 mg total) by mouth every evening. 11/01/21   Shelda Pal, DO  meloxicam (MOBIC) 7.5 MG tablet Take 1 tablet (7.5 mg total) by mouth daily. 10/10/20   Shelda Pal, DO  montelukast (SINGULAIR) 10 MG tablet Take 1 tablet (10 mg total) by mouth at bedtime. 03/07/22   Shelda Pal, DO  Multiple Vitamin (MULTIVITAMIN) capsule Take 1 capsule by mouth daily.    [provider]  OCRELIZUMAB IV Inject 1 Dose into the vein every 6 (six) months.    [provider]  ondansetron (ZOFRAN-ODT) 4 MG disintegrating tablet Take 1 tablet (4 mg total) by mouth every 8 (eight) hours as needed for nausea or vomiting. 03/07/22   Nani Ravens, Crosby Oyster, DO  solifenacin (VESICARE) 5 MG tablet Take 1 tablet (5 mg total) by mouth daily. 03/29/21   Shelda Pal, DO  sucralfate (CARAFATE) 1 g tablet Take 1 tablet (1 g total) by mouth 4 (four) times daily -  with meals and at bedtime for 7 days. 01/29/22 02/05/22  Jeanell Sparrow, DO  tiZANidine (ZANAFLEX) 2 MG tablet Take 1 tablet (2 mg total) by mouth 4 (four) times daily. 01/29/22   Shelda Pal, DO  traZODone (DESYREL) 50 MG tablet Take by mouth at bedtime as needed for sleep. Unsure dose    [provider]  venlafaxine  XR (EFFEXOR XR) 150 MG 24 hr capsule Take 1 capsule (150 mg total) by mouth daily with breakfast. 10/31/20   Shelda Pal, DO  Vitamin D, Ergocalciferol, (DRISDOL) 1.25 MG (50000 UNIT) CAPS capsule Take 50,000 Units by mouth once a week. 01/09/20   [provider]  zonisamide (ZONEGRAN) 100 MG capsule Take 300 mg by mouth daily.    [provider]      Allergies    Ketorolac, Metoprolol, and Nsaids    Review of Systems   Review of Systems  Skin:  Positive for rash.    Physical Exam Updated Vital Signs BP 117/84 (BP Location: Right Arm)   Pulse 97   Temp 97.8 F (36.6 C) (Oral)   Resp 18   Ht '5\' 6"'$  (1.676 m)   Wt 94.8 kg   SpO2 97%    BMI 33.73 kg/m  Physical Exam Constitutional: Alert and oriented.  Uncomfortable but nontoxic Eyes: Conjunctivae are normal. ENT      Head: Normocephalic and atraumatic.      Nose: No congestion. Cardiovascular: S1, S2, regular rate Respiratory: Normal respiratory effort. Breath sounds are normal.  O2 sat 97 on RA. Gastrointestinal: Soft and nontender.  Musculoskeletal: Normal range of motion in all extremities. Neurologic: Normal speech and language. No gross focal neurologic deficits are appreciated. Skin: Skin is warm.  Severely hyperesthetic painful vesicular erythematous rash expanding along the T8 dermatome on the left side of her body from her back to her mid abdomen.  No crossing midline. Psychiatric: Mood and affect are normal. Speech and behavior are normal.  ED Results / Procedures / Treatments   Labs (all labs ordered are listed, but only abnormal results are displayed) Labs Reviewed - No data to display  EKG None  Radiology No results found.  Procedures Procedures    Medications Ordered in ED Medications  ketorolac (TORADOL) injection 60 mg (60 mg Intramuscular Given 03/24/22 2043)  valACYclovir (VALTREX) tablet 1,000 mg (1,000 mg Oral Given 03/24/22 2043)  gabapentin (NEURONTIN) capsule 300 mg (300 mg Oral Given 03/24/22 2043)    ED Course/ Medical Decision Making/ A&P                           Medical Decision Making Andrea Briggs is a 46 y.o. female.  With PMH of MS, CKD, fibromyalgia who presents with painful rash.    Patient's rash is consistent with a shingles dermatomal rash following along the T8 dermatome.  She is not severely immunocompromise, has no signs of disseminated zoster, is hemodynamically stable and does not require hospitalization at this time.  Her last GFR was greater than 60.  Patient provided Toradol shot and gabapentin in the ED since she drove to the ER.  Discharging patient with as needed oxycodone and as needed gabapentin with 7  days of Valtrex 1 g 3 times daily and advise close follow-up with PCP and strict return precautions.  She is in agreement with this plan.  Risk Prescription drug management.   Final Clinical Impression(s) / ED Diagnoses Final diagnoses:  Herpes zoster without complication    Rx / DC Orders ED Discharge Orders          Ordered    gabapentin (NEURONTIN) 100 MG capsule  3 times daily PRN        03/24/22 2046    valACYclovir (VALTREX) 1000 MG tablet  3 times daily        03/24/22  2046    oxyCODONE (ROXICODONE) 5 MG immediate release tablet  Every 6 hours PRN        03/24/22 2046              Elgie Congo, MD 03/24/22 2049

## 2022-03-26 ENCOUNTER — Ambulatory Visit: Payer: Medicare Other | Admitting: Family Medicine

## 2022-03-31 ENCOUNTER — Encounter: Payer: Self-pay | Admitting: Family Medicine

## 2022-03-31 ENCOUNTER — Ambulatory Visit: Payer: Medicare HMO

## 2022-03-31 ENCOUNTER — Ambulatory Visit (INDEPENDENT_AMBULATORY_CARE_PROVIDER_SITE_OTHER): Payer: Medicare Other | Admitting: Family Medicine

## 2022-03-31 VITALS — BP 118/62 | HR 87 | Temp 98.1°F | Ht 66.0 in | Wt 203.0 lb

## 2022-03-31 DIAGNOSIS — B029 Zoster without complications: Secondary | ICD-10-CM

## 2022-03-31 MED ORDER — OXYCODONE HCL 5 MG PO TABS: 5.0000 mg | ORAL_TABLET | Freq: Four times a day (QID) | ORAL | 0 refills | Status: DC | PRN

## 2022-03-31 MED ORDER — AMITRIPTYLINE HCL 25 MG PO TABS: 25.0000 mg | ORAL_TABLET | Freq: Every day | ORAL | 0 refills | Status: DC

## 2022-03-31 NOTE — Progress Notes (Signed)
Chief Complaint  Patient presents with   Follow-up    ER--Shingles    Andrea Briggs is a 46 y.o. female here for a skin complaint.  Duration: 1 week Location: L abd wall/torso Pruritic? No Painful? Yes Drainage? Yes Other associated symptoms: drainage Therapies tried thus far: Valtrex, gabapentin  Past Medical History:  Diagnosis Date   Anemia    Anxiety    Chronic kidney disease (CKD)    follows w nephro   Chronic pain    DDD (degenerative disc disease), cervical    DDD (degenerative disc disease), lumbar    Depression    Menorrhagia with irregular cycle 10/17/2020   Migraine    MS (multiple sclerosis) (HCC)    Neuromuscular disorder (HCC)    MS dx 2016   Vertigo     BP 118/62   Pulse 87   Temp 98.1 F (36.7 C) (Oral)   Ht '5\' 6"'$  (1.676 m)   Wt 203 lb (92.1 kg)   SpO2 99%   BMI 32.77 kg/m  Gen: awake, alert, appearing stated age Lungs: No accessory muscle use Skin: dermatomal distribution of clusters of vesicles/pinkish papules, opening over lesions most anteriorly.  Psych: Age appropriate judgment and insight  Herpes zoster without complication - Plan: oxyCODONE (ROXICODONE) 5 MG immediate release tablet, amitriptyline (ELAVIL) 25 MG tablet  Oxy helped a little. Elavil 25 mg qhs for neuropathic pain. Will add Lyrica 25 mg bid if no improvement. She has finished Valtrex.  F/u prn. The patient voiced understanding and agreement to the plan.  [place], DO 03/31/22 3:08 PM

## 2022-03-31 NOTE — Patient Instructions (Signed)
Topical capsaicin cream can help.   If this does not help in the next few days, please let me know. In that case, we will replace the gabapentin with a medicine called Lyrica.   Do not drink alcohol, do any illicit/street drugs, drive or do anything that requires alertness while on this medicine.   Let us know if you need anything.

## 2022-04-15 ENCOUNTER — Telehealth (INDEPENDENT_AMBULATORY_CARE_PROVIDER_SITE_OTHER): Payer: Medicare Other | Admitting: Family Medicine

## 2022-04-15 ENCOUNTER — Encounter: Payer: Self-pay | Admitting: Family Medicine

## 2022-04-15 DIAGNOSIS — H1031 Unspecified acute conjunctivitis, right eye: Secondary | ICD-10-CM

## 2022-04-15 DIAGNOSIS — B029 Zoster without complications: Secondary | ICD-10-CM

## 2022-04-15 MED ORDER — AMITRIPTYLINE HCL 25 MG PO TABS: 50.0000 mg | ORAL_TABLET | Freq: Every day | ORAL | 0 refills | Status: DC

## 2022-04-15 MED ORDER — POLYMYXIN B-TRIMETHOPRIM 10000-0.1 UNIT/ML-% OP SOLN: 1.0000 [drp] | Freq: Four times a day (QID) | OPHTHALMIC | 0 refills | Status: DC

## 2022-04-15 NOTE — Progress Notes (Signed)
Chief Complaint  Patient presents with   Conjunctivitis    Andrea Briggs is here for right eye irritation. Due to COVID-19 pandemic, we are interacting via web portal for an electronic face-to-face visit. I verified patient's ID using 2 identifiers. Patient agreed to proceed with visit via this method. Patient is at home, I am at office. Patient and I are present for visit.   Duration: 1  day Slight throbbing pain thru eye, itching, red, drainage.  Chemical exposure? No  Recent URI? No  Contact lenses? No  History of allergies? No Treatment to date: clear eye drops  Still having burning from shingles, tx'd w Elavil 25 mg qhs, unsure if helping. Burning/itching under breast and on side.   Past Medical History:  Diagnosis Date   Anemia    Anxiety    Chronic kidney disease (CKD)    follows w nephro   Chronic pain    DDD (degenerative disc disease), cervical    DDD (degenerative disc disease), lumbar    Depression    Menorrhagia with irregular cycle 10/17/2020   Migraine    MS (multiple sclerosis) ([place])    Neuromuscular disorder ([place])    MS dx 2016   Vertigo    Family History  Problem Relation Age of Onset   Cancer Maternal Grandmother        breast cancer   Stroke Maternal Grandfather    Colon cancer Neg Hx    Esophageal cancer Neg Hx    Rectal cancer Neg Hx    Stomach cancer Neg Hx    Objective No conversational dyspnea Age appropriate judgment and insight Nml affect and mood  Acute conjunctivitis of right eye, unspecified acute conjunctivitis type - Plan: trimethoprim-polymyxin b (POLYTRIM) ophthalmic solution  Herpes zoster without complication - Plan: amitriptyline (ELAVIL) 25 MG tablet  Polytrim q 6 hrs while awake. Instructed to practice good hand hygiene and try not to touch face. Warm compresses and artificial tears also recommended. F/u if no improvement in 7-10 days. Increase Elavil to 50 mg qhs. Let me know how things are going in the next few days.   Pt voiced understanding and agreement to the plan.  [place], DO 04/15/22 9:36 AM

## 2022-04-18 ENCOUNTER — Ambulatory Visit: Payer: Medicare Other

## 2022-04-20 ENCOUNTER — Other Ambulatory Visit: Payer: Self-pay

## 2022-04-20 ENCOUNTER — Encounter (HOSPITAL_BASED_OUTPATIENT_CLINIC_OR_DEPARTMENT_OTHER): Payer: Self-pay

## 2022-04-20 ENCOUNTER — Emergency Department (HOSPITAL_BASED_OUTPATIENT_CLINIC_OR_DEPARTMENT_OTHER)
Admission: EM | Admit: 2022-04-20 | Discharge: 2022-04-20 | Disposition: A | Discharge status: Home / Self Care | Payer: Medicare Other | Attending: Emergency Medicine | Admitting: Emergency Medicine

## 2022-04-20 DIAGNOSIS — K602 Anal fissure, unspecified: Secondary | ICD-10-CM | POA: Diagnosis not present

## 2022-04-20 DIAGNOSIS — K5900 Constipation, unspecified: Secondary | ICD-10-CM | POA: Diagnosis not present

## 2022-04-20 MED ORDER — ONDANSETRON 4 MG PO TBDP
4.0000 mg | ORAL_TABLET | Freq: Once | ORAL | Status: AC
Start: 2022-04-20 — End: 2022-04-20
  Administered 2022-04-20: 4 mg via ORAL
  Filled 2022-04-20: qty 1

## 2022-04-20 NOTE — ED Notes (Signed)
Soap suds enema in; commode at the bedside and pt is attempting to hold enema in for ten minutes.

## 2022-04-20 NOTE — ED Triage Notes (Signed)
Pt reports she has not been able to have a BM today. States she is uncomfortable in her abdomen. Also reports small tinges of blood on toilet paper; unsure if she has hemorrhoids. Denies other symptoms.

## 2022-04-20 NOTE — ED Notes (Signed)
Pt had two moderate sized BM's that were very hard. Pt states she feels much better, but she is now feeling nauseous. Pt states she is ready to go home.

## 2022-04-20 NOTE — ED Provider Notes (Signed)
[facility]  Provider Note  CSN: [id number] Arrival date & time: 04/20/22 0421  History Chief Complaint  Patient presents with   Constipation    Andrea Briggs is a 46 y.o. female reports she was unable to have a bowel movement when she woke up during the night with the urge to go. She has a normal BM yesterday morning. She was recently on oxycodone for shingles pain, but has not taken any of that in the last 3 days. She reports she tried to dig her stool out with a finger but was unable to tolerate due to pain. She noticed some blood on the tissue. No abdominal pains or fevers. No vomiting.    Home Medications Prior to Admission medications   Medication Sig Start Date End Date Taking? Authorizing Provider  acetaminophen (TYLENOL) 325 MG tablet Take 2 tablets (650 mg total) by mouth every 6 (six) hours as needed. 01/29/22   Jeanell Sparrow, DO  amitriptyline (ELAVIL) 25 MG tablet Take 2 tablets (50 mg total) by mouth at bedtime. 04/15/22   Shelda Pal, DO  fluticasone (FLONASE) 50 MCG/ACT nasal spray Place 2 sprays into both nostrils daily. 03/07/22   Shelda Pal, DO  folic acid (FOLVITE) 1 MG tablet Take 1 tablet (1 mg total) by mouth daily. 01/09/21   Celso Amy, NP  hydrOXYzine (ATARAX) 25 MG tablet Take 1 tablet (25 mg total) by mouth 2 (two) times daily. 01/29/22   Shelda Pal, DO  levocetirizine (XYZAL) 5 MG tablet Take 1 tablet (5 mg total) by mouth every evening. 11/01/21   Shelda Pal, DO  meloxicam (MOBIC) 7.5 MG tablet Take 1 tablet (7.5 mg total) by mouth daily. 10/10/20   Shelda Pal, DO  montelukast (SINGULAIR) 10 MG tablet Take 1 tablet (10 mg total) by mouth at bedtime. 03/07/22   Shelda Pal, DO  Multiple Vitamin (MULTIVITAMIN) capsule Take 1 capsule by mouth daily.    [provider]  OCRELIZUMAB IV Inject 1 Dose into the vein every 6 (six) months.    [provider]  ondansetron (ZOFRAN-ODT) 4 MG disintegrating tablet Take 1 tablet (4 mg total) by mouth every 8 (eight) hours as needed for nausea or vomiting. 03/07/22   Nani Ravens, Crosby Oyster, DO  oxyCODONE (ROXICODONE) 5 MG immediate release tablet Take 1 tablet (5 mg total) by mouth every 6 (six) hours as needed for up to 10 doses for breakthrough pain. 03/31/22   Shelda Pal, DO  solifenacin (VESICARE) 5 MG tablet Take 1 tablet (5 mg total) by mouth daily. 03/29/21   Shelda Pal, DO  sucralfate (CARAFATE) 1 g tablet Take 1 tablet (1 g total) by mouth 4 (four) times daily -  with meals and at bedtime for 7 days. 01/29/22 02/05/22  Jeanell Sparrow, DO  tiZANidine (ZANAFLEX) 2 MG tablet Take 1 tablet (2 mg total) by mouth 4 (four) times daily. 01/29/22   Shelda Pal, DO  traZODone (DESYREL) 50 MG tablet Take by mouth at bedtime as needed for sleep. Unsure dose    [provider]  trimethoprim-polymyxin b (POLYTRIM) ophthalmic solution Place 1 drop into the right eye every 6 (six) hours. 04/15/22   Shelda Pal, DO  valACYclovir (VALTREX) 1000 MG tablet Take 1 tablet (1,000 mg total) by mouth 3 (three) times daily. 03/24/22   Elgie Congo, MD  Vitamin D, Ergocalciferol, (DRISDOL) 1.25 MG (50000 UNIT) CAPS capsule  Take 50,000 Units by mouth once a week. 01/09/20   [provider]  zonisamide (ZONEGRAN) 100 MG capsule Take 300 mg by mouth daily.    [provider]     Allergies    Ketorolac, Metoprolol, and Nsaids   Review of Systems   Review of Systems Please see HPI for pertinent positives and negatives  Physical Exam BP 126/89   Pulse 95   Temp 97.9 F (36.6 C) (Oral)   Resp 18   Ht '5\' 6"'$  (1.676 m)   Wt 99.3 kg   LMP 04/02/2022   SpO2 97%   BMI 35.35 kg/m   Physical Exam Vitals and nursing note reviewed.  Constitutional:      Appearance: Normal appearance.  HENT:     Head: Normocephalic and atraumatic.      Nose: Nose normal.     Mouth/Throat:     Mouth: Mucous membranes are moist.  Eyes:     Extraocular Movements: Extraocular movements intact.     Conjunctiva/sclera: Conjunctivae normal.  Cardiovascular:     Rate and Rhythm: Normal rate.  Pulmonary:     Effort: Pulmonary effort is normal.     Breath sounds: Normal breath sounds.  Abdominal:     General: Abdomen is flat.     Palpations: Abdomen is soft.     Tenderness: There is no abdominal tenderness.  Genitourinary:    Comments: Chaperone present. Small fissure at 12 O'clock, not actively bleeding. She has hard brown stool on rectal exam, unable to break up or remove digitally.  Musculoskeletal:        General: No swelling. Normal range of motion.     Cervical back: Neck supple.  Skin:    General: Skin is warm and dry.  Neurological:     General: No focal deficit present.     Mental Status: She is alert.  Psychiatric:        Mood and Affect: Mood normal.     ED Results / Procedures / Treatments   EKG None  Procedures Procedures  Medications Ordered in the ED Medications  ondansetron (ZOFRAN-ODT) disintegrating tablet 4 mg (has no administration in time range)    Initial Impression and Plan  Patient with constipation, small hard stool ball in rectum. Will give an enema and reassess.   ED Course   Clinical Course as of 04/20/22 0600  Sun Apr 20, 2022  0559 Patient has had good results with enema. Advised to use Miralax to keep stools soft. Avoid narcotic pain meds. PCP Follow up.  [CS]    Clinical Course User Index [CS] Truddie Hidden, MD     MDM Rules/Calculators/A&P Medical Decision Making Problems Addressed: Anal fissure: acute illness or injury Constipation, unspecified constipation type: acute illness or injury  Risk Prescription drug management.    Final Clinical Impression(s) / ED Diagnoses Final diagnoses:  Constipation, unspecified constipation type  Anal fissure    Rx / DC  Orders ED Discharge Orders     None        Truddie Hidden, MD 04/20/22 0600

## 2022-04-24 ENCOUNTER — Ambulatory Visit: Payer: Medicare Other

## 2022-05-09 ENCOUNTER — Other Ambulatory Visit: Payer: Self-pay

## 2022-05-09 ENCOUNTER — Ambulatory Visit (INDEPENDENT_AMBULATORY_CARE_PROVIDER_SITE_OTHER): Payer: Medicare Other

## 2022-05-09 DIAGNOSIS — Z23 Encounter for immunization: Secondary | ICD-10-CM

## 2022-05-09 NOTE — Addendum Note (Signed)
Addended by: Laure Kidney on: 05/09/2022 10:34 AM   Modules accepted: Orders

## 2022-05-09 NOTE — Progress Notes (Signed)
Pt here for influenza shot   Pt did well Left deltoid

## 2022-05-10 DIAGNOSIS — T07XXXA Unspecified multiple injuries, initial encounter: Secondary | ICD-10-CM | POA: Diagnosis not present

## 2022-05-10 DIAGNOSIS — S161XXA Strain of muscle, fascia and tendon at neck level, initial encounter: Secondary | ICD-10-CM | POA: Diagnosis not present

## 2022-05-10 DIAGNOSIS — M2578 Osteophyte, vertebrae: Secondary | ICD-10-CM | POA: Diagnosis not present

## 2022-05-14 ENCOUNTER — Telehealth: Payer: Self-pay | Admitting: Family Medicine
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
## 2022-05-14 NOTE — Telephone Encounter (Signed)
Patient informed/PCP signed and will put at the front desk for pickup in the morning.

## 2022-05-22 ENCOUNTER — Encounter: Payer: Self-pay | Admitting: Family Medicine

## 2022-05-26 DIAGNOSIS — M25561 Pain in right knee: Secondary | ICD-10-CM | POA: Diagnosis not present

## 2022-05-26 DIAGNOSIS — G35 Multiple sclerosis: Secondary | ICD-10-CM | POA: Diagnosis not present

## 2022-05-26 DIAGNOSIS — D899 Disorder involving the immune mechanism, unspecified: Secondary | ICD-10-CM | POA: Diagnosis not present

## 2022-05-26 DIAGNOSIS — B028 Zoster with other complications: Secondary | ICD-10-CM | POA: Diagnosis not present

## 2022-05-26 DIAGNOSIS — M25562 Pain in left knee: Secondary | ICD-10-CM | POA: Diagnosis not present

## 2022-05-26 DIAGNOSIS — Z79899 Other long term (current) drug therapy: Secondary | ICD-10-CM | POA: Diagnosis not present

## 2022-06-04 ENCOUNTER — Ambulatory Visit (INDEPENDENT_AMBULATORY_CARE_PROVIDER_SITE_OTHER): Payer: Medicare Other | Admitting: *Deleted

## 2022-06-04 DIAGNOSIS — Z0000 Encounter for general adult medical examination without abnormal findings: Secondary | ICD-10-CM

## 2022-06-04 NOTE — Patient Instructions (Signed)
Andrea Briggs , Thank you for taking time to come for your Medicare Wellness Visit. I appreciate your ongoing commitment to your health goals. Please review the following plan we discussed and let me know if I can assist you in the future.   These are the goals we discussed:  Goals      Patient Stated     Eat healthier, drink more water & increase activity        This is a list of the screening recommended for you and due dates:  Health Maintenance  Topic Date Due   Hepatitis C Screening: USPSTF Recommendation to screen - Ages 37-79 yo.  Never done   COVID-19 Vaccine (3 - Moderna risk series) 03/25/2020   Medicare Annual Wellness Visit  06/05/2023   Pap Smear  09/03/2024   Tetanus Vaccine  08/11/2029   Colon Cancer Screening  05/07/2030   Flu Shot  Completed   HIV Screening  Completed   HPV Vaccine  Aged Out      Next appointment: Follow up in one year for your annual wellness visit.   Preventive Care 40-64 Years, Female Preventive care refers to lifestyle choices and visits with your health care provider that can promote health and wellness. What does preventive care include? A yearly physical exam. This is also called an annual well check. Dental exams once or twice a year. Routine eye exams. Ask your health care provider how often you should have your eyes checked. Personal lifestyle choices, including: Daily care of your teeth and gums. Regular physical activity. Eating a healthy diet. Avoiding tobacco and drug use. Limiting alcohol use. Practicing safe sex. Taking low-dose aspirin daily starting at age 83. Taking vitamin and mineral supplements as recommended by your health care provider. What happens during an annual well check? The services and screenings done by your health care provider during your annual well check will depend on your age, overall health, lifestyle risk factors, and family history of disease. Counseling  Your health care provider may ask you  questions about your: Alcohol use. Tobacco use. Drug use. Emotional well-being. Home and relationship well-being. Sexual activity. Eating habits. Work and work Statistician. Method of birth control. Menstrual cycle. Pregnancy history. Screening  You may have the following tests or measurements: Height, weight, and BMI. Blood pressure. Lipid and cholesterol levels. These may be checked every 5 years, or more frequently if you are over 18 years old. Skin check. Lung cancer screening. You may have this screening every year starting at age 8 if you have a 30-pack-year history of smoking and currently smoke or have quit within the past 15 years. Fecal occult blood test (FOBT) of the stool. You may have this test every year starting at age 67. Flexible sigmoidoscopy or colonoscopy. You may have a sigmoidoscopy every 5 years or a colonoscopy every 10 years starting at age 79. Hepatitis C blood test. Hepatitis B blood test. Sexually transmitted disease (STD) testing. Diabetes screening. This is done by checking your blood sugar (glucose) after you have not eaten for a while (fasting). You may have this done every 1-3 years. Mammogram. This may be done every 1-2 years. Talk to your health care provider about when you should start having regular mammograms. This may depend on whether you have a family history of breast cancer. BRCA-related cancer screening. This may be done if you have a family history of breast, ovarian, tubal, or peritoneal cancers. Pelvic exam and Pap test. This may be done every  3 years starting at age 58. Starting at age 38, this may be done every 5 years if you have a Pap test in combination with an HPV test. Bone density scan. This is done to screen for osteoporosis. You may have this scan if you are at high risk for osteoporosis. Discuss your test results, treatment options, and if necessary, the need for more tests with your health care provider. Vaccines  Your health  care provider may recommend certain vaccines, such as: Influenza vaccine. This is recommended every year. Tetanus, diphtheria, and acellular pertussis (Tdap, Td) vaccine. You may need a Td booster every 10 years. Zoster vaccine. You may need this after age 81. Pneumococcal 13-valent conjugate (PCV13) vaccine. You may need this if you have certain conditions and were not previously vaccinated. Pneumococcal polysaccharide (PPSV23) vaccine. You may need one or two doses if you smoke cigarettes or if you have certain conditions. Talk to your health care provider about which screenings and vaccines you need and how often you need them. This information is not intended to replace advice given to you by your health care provider. Make sure you discuss any questions you have with your health care provider. Document Released: 08/03/2015 Document Revised: 03/26/2016 Document Reviewed: 05/08/2015 Elsevier Interactive Patient Education  2017 [place] Prevention in the Home Falls can cause injuries. They can happen to people of all ages. There are many things you can do to make your home safe and to help prevent falls. What can I do on the outside of my home? Regularly fix the edges of walkways and driveways and fix any cracks. Remove anything that might make you trip as you walk through a door, such as a raised step or threshold. Trim any bushes or trees on the path to your home. Use bright outdoor lighting. Clear any walking paths of anything that might make someone trip, such as rocks or tools. Regularly check to see if handrails are loose or broken. Make sure that both sides of any steps have handrails. Any raised decks and porches should have guardrails on the edges. Have any leaves, snow, or ice cleared regularly. Use sand or salt on walking paths during winter. Clean up any spills in your garage right away. This includes oil or grease spills. What can I do in the bathroom? Use  night lights. Install grab bars by the toilet and in the tub and shower. Do not use towel bars as grab bars. Use non-skid mats or decals in the tub or shower. If you need to sit down in the shower, use a plastic, non-slip stool. Keep the floor dry. Clean up any water that spills on the floor as soon as it happens. Remove soap buildup in the tub or shower regularly. Attach bath mats securely with double-sided non-slip rug tape. Do not have throw rugs and other things on the floor that can make you trip. What can I do in the bedroom? Use night lights. Make sure that you have a light by your bed that is easy to reach. Do not use any sheets or blankets that are too big for your bed. They should not hang down onto the floor. Have a firm chair that has side arms. You can use this for support while you get dressed. Do not have throw rugs and other things on the floor that can make you trip. What can I do in the kitchen? Clean up any spills right away. Avoid walking on wet  floors. Keep items that you use a lot in easy-to-reach places. If you need to reach something above you, use a strong step stool that has a grab bar. Keep electrical cords out of the way. Do not use floor polish or wax that makes floors slippery. If you must use wax, use non-skid floor wax. Do not have throw rugs and other things on the floor that can make you trip. What can I do with my stairs? Do not leave any items on the stairs. Make sure that there are handrails on both sides of the stairs and use them. Fix handrails that are broken or loose. Make sure that handrails are as long as the stairways. Check any carpeting to make sure that it is firmly attached to the stairs. Fix any carpet that is loose or worn. Avoid having throw rugs at the top or bottom of the stairs. If you do have throw rugs, attach them to the floor with carpet tape. Make sure that you have a light switch at the top of the stairs and the bottom of the  stairs. If you do not have them, ask someone to add them for you. What else can I do to help prevent falls? Wear shoes that: Do not have high heels. Have rubber bottoms. Are comfortable and fit you well. Are closed at the toe. Do not wear sandals. If you use a stepladder: Make sure that it is fully opened. Do not climb a closed stepladder. Make sure that both sides of the stepladder are locked into place. Ask someone to hold it for you, if possible. Clearly mark and make sure that you can see: Any grab bars or handrails. First and last steps. Where the edge of each step is. Use tools that help you move around (mobility aids) if they are needed. These include: Canes. Walkers. Scooters. Crutches. Turn on the lights when you go into a dark area. Replace any light bulbs as soon as they burn out. Set up your furniture so you have a clear path. Avoid moving your furniture around. If any of your floors are uneven, fix them. If there are any pets around you, be aware of where they are. Review your medicines with your doctor. Some medicines can make you feel dizzy. This can increase your chance of falling. Ask your doctor what other things that you can do to help prevent falls. This information is not intended to replace advice given to you by your health care provider. Make sure you discuss any questions you have with your health care provider. Document Released: 05/03/2009 Document Revised: 12/13/2015 Document Reviewed: 08/11/2014 Elsevier Interactive Patient Education  2017 [employer].

## 2022-06-04 NOTE — Progress Notes (Signed)
Subjective:   Andrea Briggs is a 46 y.o. female who presents for Medicare Annual (Subsequent) preventive examination.  I connected with  Andrea Briggs on 06/04/22 by a audio enabled telemedicine application and verified that I am speaking with the correct person using two identifiers.  Patient Location: Home  Provider Location: Office/Clinic  I discussed the limitations of evaluation and management by telemedicine. The patient expressed understanding and agreed to proceed.   Review of Systems    Defer to PCP Cardiac Risk Factors include: obesity (BMI >30kg/m2)     Objective:    There were no vitals filed for this visit. There is no height or weight on file to calculate BMI.     06/04/2022    9:42 AM 04/20/2022    4:52 AM 03/24/2022    7:58 PM 01/28/2022   11:35 PM 01/17/2022    3:00 PM 07/17/2021   10:09 AM 07/16/2021   10:03 AM  Advanced Directives  Does Patient Have a Medical Advance Directive? No No No No No No No  Would patient like information on creating a medical advance directive? No - Patient declined No - Patient declined No - Patient declined  No - Patient declined No - Patient declined No - Patient declined    Current Medications (verified) Outpatient Encounter Medications as of 06/04/2022  Medication Sig   acetaminophen (TYLENOL) 325 MG tablet Take 2 tablets (650 mg total) by mouth every 6 (six) hours as needed.   amitriptyline (ELAVIL) 25 MG tablet Take 2 tablets (50 mg total) by mouth at bedtime.   fluticasone (FLONASE) 50 MCG/ACT nasal spray Place 2 sprays into both nostrils daily.   folic acid (FOLVITE) 1 MG tablet Take 1 tablet (1 mg total) by mouth daily.   hydrOXYzine (ATARAX) 25 MG tablet Take 1 tablet (25 mg total) by mouth 2 (two) times daily.   levocetirizine (XYZAL) 5 MG tablet Take 1 tablet (5 mg total) by mouth every evening.   meloxicam (MOBIC) 7.5 MG tablet Take 1 tablet (7.5 mg total) by mouth daily.   montelukast (SINGULAIR) 10 MG  tablet Take 1 tablet (10 mg total) by mouth at bedtime.   Multiple Vitamin (MULTIVITAMIN) capsule Take 1 capsule by mouth daily.   OCRELIZUMAB IV Inject 1 Dose into the vein every 6 (six) months.   ondansetron (ZOFRAN-ODT) 4 MG disintegrating tablet Take 1 tablet (4 mg total) by mouth every 8 (eight) hours as needed for nausea or vomiting.   solifenacin (VESICARE) 5 MG tablet Take 1 tablet (5 mg total) by mouth daily.   sucralfate (CARAFATE) 1 g tablet Take 1 tablet (1 g total) by mouth 4 (four) times daily -  with meals and at bedtime for 7 days.   tiZANidine (ZANAFLEX) 2 MG tablet Take 1 tablet (2 mg total) by mouth 4 (four) times daily.   traZODone (DESYREL) 50 MG tablet Take by mouth at bedtime as needed for sleep. Unsure dose   trimethoprim-polymyxin b (POLYTRIM) ophthalmic solution Place 1 drop into the right eye every 6 (six) hours.   valACYclovir (VALTREX) 1000 MG tablet Take 1 tablet (1,000 mg total) by mouth 3 (three) times daily.   Vitamin D, Ergocalciferol, (DRISDOL) 1.25 MG (50000 UNIT) CAPS capsule Take 50,000 Units by mouth once a week.   zonisamide (ZONEGRAN) 100 MG capsule Take 300 mg by mouth daily.   [DISCONTINUED] oxyCODONE (ROXICODONE) 5 MG immediate release tablet Take 1 tablet (5 mg total) by mouth every 6 (six) hours as  needed for up to 10 doses for breakthrough pain.   Facility-Administered Encounter Medications as of 06/04/2022  Medication   0.9 %  sodium chloride infusion    Allergies (verified) Ketorolac, Metoprolol, and Nsaids   History: Past Medical History:  Diagnosis Date   Anemia    Anxiety    Chronic kidney disease (CKD)    follows w nephro   Chronic pain    DDD (degenerative disc disease), cervical    DDD (degenerative disc disease), lumbar    Depression    Menorrhagia with irregular cycle 10/17/2020   Migraine    MS (multiple sclerosis) (HCC)    Neuromuscular disorder ([place])    MS dx 2016   Vertigo    Past Surgical History:  Procedure  Laterality Date   carpel tunnel     HERNIA REPAIR     Family History  Problem Relation Age of Onset   Cancer Maternal Grandmother        breast cancer   Stroke Maternal Grandfather    Colon cancer Neg Hx    Esophageal cancer Neg Hx    Rectal cancer Neg Hx    Stomach cancer Neg Hx    Social History   Socioeconomic History   Marital status: Married    Spouse name: Not on file   Number of children: 1   Years of education: Not on file   Highest education level: Not on file  Occupational History   Not on file  Tobacco Use   Smoking status: Never   Smokeless tobacco: Never  Vaping Use   Vaping Use: Never used  Substance and Sexual Activity   Alcohol use: No   Drug use: No   Sexual activity: Yes    Partners: Male    Birth control/protection: None  Other Topics Concern   Not on file  Social History Narrative   Not on file   Social Determinants of Health   Financial Resource Strain: Low Risk  (03/26/2021)   Overall Financial Resource Strain (CARDIA)    Difficulty of Paying Living Expenses: Not hard at all  Food Insecurity: No Food Insecurity (03/26/2021)   Hunger Vital Sign    Worried About Running Out of Food in the Last Year: Never true    Ran Out of Food in the Last Year: Never true  Transportation Needs: No Transportation Needs (03/26/2021)   PRAPARE - Hydrologist (Medical): No    Lack of Transportation (Non-Medical): No  Physical Activity: Inactive (03/26/2021)   Exercise Vital Sign    Days of Exercise per Week: 0 days    Minutes of Exercise per Session: 0 min  Stress: No Stress Concern Present (03/26/2021)   [place]    Feeling of Stress : Not at all  Social Connections: Moderately Isolated (03/26/2021)   Social Connection and Isolation Panel [NHANES]    Frequency of Communication with Friends and Family: More than three times a week    Frequency of Social Gatherings with  Friends and Family: More than three times a week    Attends Religious Services: More than 4 times per year    Active Member of [employer] or Organizations: No    Attends Archivist Meetings: Never    Marital Status: Widowed    Tobacco Counseling Counseling given: Not Answered   Clinical Intake:  Pre-visit preparation completed: Yes  Pain : No/denies pain  Diabetes: No  How often do  you need to have someone help you when you read instructions, pamphlets, or other written materials from your doctor or pharmacy?: 1 - Never  Activities of Daily Living    06/04/2022    9:44 AM  In your present state of health, do you have any difficulty performing the following activities:  Hearing? 0  Vision? 0  Difficulty concentrating or making decisions? 0  Walking or climbing stairs? 0  Dressing or bathing? 0  Doing errands, shopping? 0  Preparing Food and eating ? N  Using the Toilet? N  In the past six months, have you accidently leaked urine? Y  Do you have problems with loss of bowel control? N  Managing your Medications? N  Managing your Finances? N  Housekeeping or managing your Housekeeping? N    Patient Care Team: Shelda Pal, DO as PCP - General (Family Medicine)  Indicate any recent Medical Services you may have received from other than [facility] providers in the past year (date may be approximate).     Assessment:   This is a routine wellness examination for Alexie.  Hearing/Vision screen No results found.  Dietary issues and exercise activities discussed: Current Exercise Habits: The patient does not participate in regular exercise at present, Exercise limited by: orthopedic condition(s)   Goals Addressed   None    Depression Screen    06/04/2022    9:44 AM 03/31/2022    2:59 PM 03/26/2021   11:38 AM 10/31/2020    8:23 AM  PHQ 2/9 Scores  PHQ - 2 Score 2 0 0 0    Fall Risk    06/04/2022    9:42 AM 03/26/2021   11:37 AM  Fall Risk   Falls  in the past year? 0 1  Number falls in past yr: 0 1  Injury with Fall? 0 0  Risk for fall due to : No Fall Risks History of fall(s)  Follow up Falls evaluation completed Falls prevention discussed    FALL RISK PREVENTION PERTAINING TO THE HOME:  Any stairs in or around the home? Yes  If so, are there any without handrails? No  Home free of loose throw rugs in walkways, pet beds, electrical cords, etc? Yes  Adequate lighting in your home to reduce risk of falls? Yes   ASSISTIVE DEVICES UTILIZED TO PREVENT FALLS:  Life alert? No  Use of a cane, walker or w/c? No  Grab bars in the bathroom? No  Shower chair or bench in shower? No  Elevated toilet seat or a handicapped toilet? No  but a little taller than standard.  TIMED UP AND GO:  Was the test performed?  No, audio visit .    Cognitive Function:        06/04/2022    9:49 AM  6CIT Screen  What Year? 0 points  What month? 0 points  What time? 0 points  Count back from 20 0 points  Months in reverse 0 points  Repeat phrase 4 points  Total Score 4 points    Immunizations Immunization History  Administered Date(s) Administered   Influenza,inj,Quad PF,6+ Mos 06/26/2017, 04/30/2018, 08/12/2019, 05/11/2020, 05/16/2021, 05/09/2022   Moderna Sars-Covid-2 Vaccination 01/28/2020, 02/26/2020   Tdap 08/12/2019    TDAP status: Up to date  Flu Vaccine status: Up to date  Covid-19 vaccine status: Information provided on how to obtain vaccines.   Qualifies for Shingles Vaccine? No     Screening Tests Health Maintenance  Topic Date Due   Hepatitis  C Screening  Never done   COVID-19 Vaccine (3 - Moderna risk series) 03/25/2020   Medicare Annual Wellness (AWV)  03/26/2022   PAP SMEAR-Modifier  09/03/2024   TETANUS/TDAP  08/11/2029   COLONOSCOPY (Pts 45-31yr Insurance coverage will need to be confirmed)  05/07/2030   INFLUENZA VACCINE  Completed   HIV Screening  Completed   HPV VACCINES  Aged Out    Health  Maintenance  Health Maintenance Due  Topic Date Due   Hepatitis C Screening  Never done   COVID-19 Vaccine (3 - Moderna risk series) 03/25/2020   Medicare Annual Wellness (AWV)  03/26/2022    Colorectal cancer screening: Type of screening: Colonoscopy. Completed 05/07/20. Repeat every 10 years  Mammogram status: Completed 2.24.21 pt will call to schedule. Repeat every year  Lung Cancer Screening: (Low Dose CT Chest recommended if Age 47-80years, 30 pack-year currently smoking OR have quit w/in 15years.) does not qualify.    Additional Screening:  Hepatitis C Screening: does qualify; Completed N/a  Vision Screening: Recommended annual ophthalmology exams for early detection of glaucoma and other disorders of the eye. Is the patient up to date with their annual eye exam?  Yes  Who is the provider or what is the name of the office in which the patient attends annual eye exams? [facility] If pt is not established with a provider, would they like to be referred to a provider to establish care? No .   Dental Screening: Recommended annual dental exams for proper oral hygiene  Community Resource Referral / Chronic Care Management: CRR required this visit?  No   CCM required this visit?  No      Plan:     I have personally reviewed and noted the following in the patient's chart:   Medical and social history Use of alcohol, tobacco or illicit drugs  Current medications and supplements including opioid prescriptions. Patient is not currently taking opioid prescriptions. Functional ability and status Nutritional status Physical activity Advanced directives List of other physicians Hospitalizations, surgeries, and ER visits in previous 12 months Vitals Screenings to include cognitive, depression, and falls Referrals and appointments  In addition, I have reviewed and discussed with patient certain preventive protocols, quality metrics, and best practice recommendations. A  written personalized care plan for preventive services as well as general preventive health recommendations were provided to patient.   Due to this being a telephonic visit, the after visit summary with patients personalized plan was offered to patient via mail or my-chart.  Per request, patient was mailed a copy of AVS.  BBeatris Ship CBystrom  06/04/2022   Nurse Notes: None

## 2022-06-11 ENCOUNTER — Other Ambulatory Visit: Payer: Self-pay | Admitting: Family Medicine

## 2022-06-11 DIAGNOSIS — N3281 Overactive bladder: Secondary | ICD-10-CM

## 2022-07-15 ENCOUNTER — Encounter: Payer: Self-pay | Admitting: Nurse Practitioner

## 2022-07-15 ENCOUNTER — Telehealth (INDEPENDENT_AMBULATORY_CARE_PROVIDER_SITE_OTHER): Payer: Medicare Other | Admitting: Nurse Practitioner

## 2022-07-15 DIAGNOSIS — J40 Bronchitis, not specified as acute or chronic: Secondary | ICD-10-CM | POA: Diagnosis not present

## 2022-07-15 DIAGNOSIS — J301 Allergic rhinitis due to pollen: Secondary | ICD-10-CM

## 2022-07-15 MED ORDER — LEVOCETIRIZINE DIHYDROCHLORIDE 5 MG PO TABS: 5.0000 mg | ORAL_TABLET | Freq: Every evening | ORAL | 0 refills | Status: DC

## 2022-07-15 MED ORDER — FLUTICASONE PROPIONATE 50 MCG/ACT NA SUSP
2.0000 | Freq: Every day | NASAL | 0 refills | Status: DC
Start: 2022-07-15 — End: 2022-08-29

## 2022-07-15 MED ORDER — PREDNISONE 20 MG PO TABS: 40.0000 mg | ORAL_TABLET | Freq: Every day | ORAL | 0 refills | Status: DC

## 2022-07-15 MED ORDER — PROMETHAZINE-DM 6.25-15 MG/5ML PO SYRP: 5.0000 mL | ORAL_SOLUTION | Freq: Four times a day (QID) | ORAL | 0 refills | Status: DC | PRN

## 2022-07-15 NOTE — Patient Instructions (Signed)
It was great to see you!  Start prednisone 2 tablets daily for 5 days in the morning with food   Start cough medication every 6 hours as needed for cough  I have refilled your xyzal and flonase to take once a day  Drink plenty of fluids and get rest  Let's follow-up if your symptoms worsen or don't improve.   Take care,  Vance Peper, NP

## 2022-07-15 NOTE — Progress Notes (Signed)
[facility] [address] [place] [address] Dept: [phone] Dept Fax: [phone]  Virtual Video Visit  I connected with Chauncey Cruel on 07/15/22 at 11:20 AM EST by a video enabled telemedicine application and verified that I am speaking with the correct person using two identifiers.  Location patient: Home Location provider: Clinic Persons participating in the virtual visit: Patient; Vance Peper, NP; Bynum Bellows, CMA  I discussed the limitations of evaluation and management by telemedicine and the availability of in person appointments. The patient expressed understanding and agreed to proceed.  Chief Complaint  Patient presents with   Acute Visit    C/o cough, loss of voice x 3 days     SUBJECTIVE:  HPI: Andrea Briggs is a 46 y.o. female who presents with cough and loss of voice for 3 days. Home covid-19 test was negative.   UPPER RESPIRATORY TRACT INFECTION  Fever: no Cough: yes Shortness of breath: no Wheezing: no Chest pain: no Chest tightness: no Chest congestion: no Nasal congestion: yes Runny nose: yes Post nasal drip: no Sneezing: no Sore throat: yes Swollen glands: no Sinus pressure: no Headache: no Face pain: no Toothache: no Ear pain: no bilateral Ear pressure: no bilateral Eyes red/itching:no Eye drainage/crusting: no  Vomiting: no Rash: no Fatigue: yes Sick contacts: yes - grandkids  Strep contacts: no  Context: stable Recurrent sinusitis: no Relief with OTC cold/cough medications: no  Treatments attempted: OTC congestion and sinus     Patient Active Problem List   Diagnosis Date Noted   Iron deficiency anemia due to chronic blood loss 10/17/2020   Menorrhagia with irregular cycle 10/17/2020   Fever and chills 07/30/2020   Weakness of both lower extremities 04/30/2018   Greater trochanteric bursitis of left hip 03/10/2018   Aortic atherosclerosis ([place]) 11/12/2017    Fibromyalgia 08/13/2017   MS (multiple sclerosis) (HCC)    DDD (degenerative disc disease), cervical    DDD (degenerative disc disease), lumbar    Chronic kidney disease (CKD)    Chronic pain    Carpal tunnel syndrome of left wrist 09/16/2013    Past Surgical History:  Procedure Laterality Date   carpel tunnel     HERNIA REPAIR      Family History  Problem Relation Age of Onset   Cancer Maternal Grandmother        breast cancer   Stroke Maternal Grandfather    Colon cancer Neg Hx    Esophageal cancer Neg Hx    Rectal cancer Neg Hx    Stomach cancer Neg Hx     Social History   Tobacco Use   Smoking status: Never   Smokeless tobacco: Never  Vaping Use   Vaping Use: Never used  Substance Use Topics   Alcohol use: No   Drug use: No     Current Outpatient Medications:    acetaminophen (TYLENOL) 325 MG tablet, Take 2 tablets (650 mg total) by mouth every 6 (six) hours as needed., Disp: 36 tablet, Rfl: 0   amitriptyline (ELAVIL) 25 MG tablet, Take 2 tablets (50 mg total) by mouth at bedtime., Disp: 60 tablet, Rfl: 0   hydrOXYzine (ATARAX) 25 MG tablet, Take 1 tablet (25 mg total) by mouth 2 (two) times daily., Disp: 30 tablet, Rfl: 0   meloxicam (MOBIC) 7.5 MG tablet, Take 1 tablet (7.5 mg total) by mouth daily., Disp: 30 tablet, Rfl: 2   Multiple Vitamin (MULTIVITAMIN) capsule, Take 1 capsule by mouth  daily., Disp: , Rfl:    OCRELIZUMAB IV, Inject 1 Dose into the vein every 6 (six) months., Disp: , Rfl:    ondansetron (ZOFRAN-ODT) 4 MG disintegrating tablet, Take 1 tablet (4 mg total) by mouth every 8 (eight) hours as needed for nausea or vomiting., Disp: 90 tablet, Rfl: 1   oxybutynin (DITROPAN-XL) 10 MG 24 hr tablet, TAKE 1 TABLET BY MOUTH AT BEDTIME, Disp: 30 tablet, Rfl: 0   predniSONE (DELTASONE) 20 MG tablet, Take 2 tablets (40 mg total) by mouth daily with breakfast., Disp: 10 tablet, Rfl: 0   promethazine-dextromethorphan (PROMETHAZINE-DM) 6.25-15 MG/5ML syrup,  Take 5 mLs by mouth 4 (four) times daily as needed for cough., Disp: 118 mL, Rfl: 0   solifenacin (VESICARE) 5 MG tablet, Take 1 tablet (5 mg total) by mouth daily., Disp: 30 tablet, Rfl: 2   traZODone (DESYREL) 50 MG tablet, Take by mouth at bedtime as needed for sleep. Unsure dose, Disp: , Rfl:    fluticasone (FLONASE) 50 MCG/ACT nasal spray, Place 2 sprays into both nostrils daily., Disp: 16 g, Rfl: 0   folic acid (FOLVITE) 1 MG tablet, Take 1 tablet (1 mg total) by mouth daily. (Patient not taking: Reported on 07/15/2022), Disp: 90 tablet, Rfl: 3   levocetirizine (XYZAL) 5 MG tablet, Take 1 tablet (5 mg total) by mouth every evening., Disp: 30 tablet, Rfl: 0   montelukast (SINGULAIR) 10 MG tablet, Take 1 tablet (10 mg total) by mouth at bedtime. (Patient not taking: Reported on 07/15/2022), Disp: 30 tablet, Rfl: 3   sucralfate (CARAFATE) 1 g tablet, Take 1 tablet (1 g total) by mouth 4 (four) times daily -  with meals and at bedtime for 7 days., Disp: 28 tablet, Rfl: 0   tiZANidine (ZANAFLEX) 2 MG tablet, Take 1 tablet (2 mg total) by mouth 4 (four) times daily. (Patient not taking: Reported on 07/15/2022), Disp: 30 tablet, Rfl: 0   valACYclovir (VALTREX) 1000 MG tablet, Take 1 tablet (1,000 mg total) by mouth 3 (three) times daily. (Patient not taking: Reported on 07/15/2022), Disp: 21 tablet, Rfl: 0   Vitamin D, Ergocalciferol, (DRISDOL) 1.25 MG (50000 UNIT) CAPS capsule, Take 50,000 Units by mouth once a week. (Patient not taking: Reported on 07/15/2022), Disp: , Rfl:    zonisamide (ZONEGRAN) 100 MG capsule, Take 300 mg by mouth daily. (Patient not taking: Reported on 07/15/2022), Disp: , Rfl:   Current Facility-Administered Medications:    0.9 %  sodium chloride infusion, 500 mL, Intravenous, Continuous, Cirigliano, Vito V, DO  Allergies  Allergen Reactions   Ketorolac Other (See Comments)    Renal insufficiency   Metoprolol Swelling   Nsaids Other (See Comments)    nephropathy     ROS: See pertinent positives and negatives per HPI.  OBSERVATIONS/OBJECTIVE:  VITALS per patient if applicable: There were no vitals filed for this visit. There is no height or weight on file to calculate BMI.    GENERAL: Alert and oriented. Appears well and in no acute distress.  HEENT: Atraumatic. Conjunctiva clear. No obvious abnormalities on inspection of external nose and ears.  NECK: Normal movements of the head and neck.  LUNGS: On inspection, no signs of respiratory distress. Breathing rate appears normal. No obvious gross SOB, gasping or wheezing, and no conversational dyspnea.  CV: No obvious cyanosis.  MS: Moves all visible extremities without noticeable abnormality.  PSYCH/NEURO: Pleasant and cooperative. No obvious depression or anxiety. Speech and thought processing grossly intact.  ASSESSMENT AND PLAN:  Problem  List Items Addressed This Visit   None Visit Diagnoses     Bronchitis    -  Primary   Start prednsione '40mg'$  x5 days and promethazine-dextromethorphan QID prn cough. Rest, drink plenty of fluids. F/U if not improving.   Seasonal allergic rhinitis due to pollen       Xyzal and flonase refills sent to the pharmacy.   Relevant Medications   levocetirizine (XYZAL) 5 MG tablet        I discussed the assessment and treatment plan with the patient. The patient was provided an opportunity to ask questions and all were answered. The patient agreed with the plan and demonstrated an understanding of the instructions.   The patient was advised to call back or seek an in-person evaluation if the symptoms worsen or if the condition fails to improve as anticipated.   Charyl Dancer, NP

## 2022-07-25 ENCOUNTER — Other Ambulatory Visit: Payer: Self-pay | Admitting: Family Medicine

## 2022-07-25 ENCOUNTER — Inpatient Hospital Stay: Payer: Medicare Other | Attending: Hematology & Oncology

## 2022-07-25 ENCOUNTER — Inpatient Hospital Stay: Payer: Medicare Other | Admitting: Family

## 2022-07-25 DIAGNOSIS — N3281 Overactive bladder: Secondary | ICD-10-CM

## 2022-07-25 MED ORDER — OXYBUTYNIN CHLORIDE ER 10 MG PO TB24: 10.0000 mg | ORAL_TABLET | Freq: Every day | ORAL | 0 refills | Status: DC

## 2022-08-28 ENCOUNTER — Encounter: Payer: Self-pay | Admitting: Family

## 2022-08-29 ENCOUNTER — Encounter: Payer: Self-pay | Admitting: Family Medicine

## 2022-08-29 ENCOUNTER — Encounter: Payer: Self-pay | Admitting: Family

## 2022-08-29 ENCOUNTER — Telehealth (INDEPENDENT_AMBULATORY_CARE_PROVIDER_SITE_OTHER): Payer: 59 | Admitting: Family Medicine

## 2022-08-29 DIAGNOSIS — J0100 Acute maxillary sinusitis, unspecified: Secondary | ICD-10-CM | POA: Diagnosis not present

## 2022-08-29 DIAGNOSIS — R490 Dysphonia: Secondary | ICD-10-CM

## 2022-08-29 MED ORDER — HYDROCOD POLI-CHLORPHE POLI ER 10-8 MG/5ML PO SUER: 5.0000 mL | Freq: Two times a day (BID) | ORAL | 0 refills | Status: DC | PRN

## 2022-08-29 MED ORDER — FLUTICASONE PROPIONATE 50 MCG/ACT NA SUSP: 2.0000 | Freq: Every day | NASAL | 1 refills | Status: DC

## 2022-08-29 MED ORDER — DOXYCYCLINE HYCLATE 100 MG PO TABS: 100.0000 mg | ORAL_TABLET | Freq: Two times a day (BID) | ORAL | 0 refills | Status: AC

## 2022-08-29 NOTE — Progress Notes (Signed)
Chief Complaint  Patient presents with   Cough    Congestion     Andrea Briggs here for URI complaints. Due to COVID-19 pandemic, we are interacting via web portal for an electronic face-to-face visit. I verified patient's ID using 2 identifiers. Patient agreed to proceed with visit via this method. Patient is at home, I am at office. Patient and I are present for visit.    Duration: 5 mo she notes her hoarseness has been coming and going, current sickness started 3 weeks ago Associated symptoms: sinus congestion, loss of voice, sinus pain, rhinorrhea, and coughing Denies: itchy watery eyes, ear pain, ear drainage, sore throat, wheezing, myalgia, and fevers Treatment to date: Nyquil, Prednisone (helps a little), cough syrup Sick contacts: No  Past Medical History:  Diagnosis Date   Anemia    Anxiety    Chronic kidney disease (CKD)    follows w nephro   Chronic pain    DDD (degenerative disc disease), cervical    DDD (degenerative disc disease), lumbar    Depression    Menorrhagia with irregular cycle 10/17/2020   Migraine    MS (multiple sclerosis) ([place])    Neuromuscular disorder ([place])    MS dx 2016   Vertigo     Objective No conversational dyspnea Age appropriate judgment and insight Nml affect and mood  Acute maxillary sinusitis, recurrence not specified - Plan: fluticasone (FLONASE) 50 MCG/ACT nasal spray, doxycycline (VIBRA-TABS) 100 MG tablet  Hoarseness - Plan: Ambulatory referral to ENT  Given duration, will treat with doxycycline and Flonase.  Refer to ENT for further evaluation of the hoarseness.  Continue to push fluids, practice good hand hygiene, cover mouth when coughing. F/u prn. If starting to experience fevers, shaking, or shortness of breath, seek immediate care. Pt voiced understanding and agreement to the plan.  [place], DO 08/29/22 10:39 AM

## 2022-09-01 ENCOUNTER — Telehealth: Payer: Self-pay | Admitting: Family Medicine

## 2022-09-01 MED ORDER — HYDROCODONE BIT-HOMATROP MBR 5-1.5 MG/5ML PO SOLN: 5.0000 mL | Freq: Three times a day (TID) | ORAL | 0 refills | Status: DC | PRN

## 2022-09-01 NOTE — Telephone Encounter (Signed)
Patient states the cough medicine sent to her pharmacy is costing her $57 since her insurance wont cover it. She would like to know if something else can be sent. Please advise.

## 2022-09-01 NOTE — Telephone Encounter (Signed)
Patient informed. 

## 2022-09-01 NOTE — Telephone Encounter (Signed)
I will send another. Ty.

## 2022-10-03 ENCOUNTER — Other Ambulatory Visit: Payer: Medicare Other

## 2022-10-03 ENCOUNTER — Ambulatory Visit: Payer: Medicare Other | Admitting: Family

## 2022-11-07 ENCOUNTER — Other Ambulatory Visit: Payer: Self-pay

## 2022-11-07 ENCOUNTER — Inpatient Hospital Stay (HOSPITAL_BASED_OUTPATIENT_CLINIC_OR_DEPARTMENT_OTHER): Payer: 59 | Admitting: Family

## 2022-11-07 ENCOUNTER — Encounter: Payer: Self-pay | Admitting: Family

## 2022-11-07 ENCOUNTER — Inpatient Hospital Stay: Payer: 59 | Attending: Hematology & Oncology

## 2022-11-07 VITALS — BP 124/67 | HR 66 | Temp 98.0°F | Resp 17 | Ht 66.0 in | Wt 211.0 lb

## 2022-11-07 DIAGNOSIS — D500 Iron deficiency anemia secondary to blood loss (chronic): Secondary | ICD-10-CM | POA: Diagnosis not present

## 2022-11-07 DIAGNOSIS — D509 Iron deficiency anemia, unspecified: Secondary | ICD-10-CM | POA: Insufficient documentation

## 2022-11-07 LAB — FERRITIN: Ferritin: 15 ng/mL (ref 11–307)

## 2022-11-07 LAB — IRON AND IRON BINDING CAPACITY (CC-WL,HP ONLY)
Iron: 54 ug/dL (ref 28–170)
Saturation Ratios: 16 % (ref 10.4–31.8)
TIBC: 347 ug/dL (ref 250–450)
UIBC: 293 ug/dL (ref 148–442)

## 2022-11-07 LAB — CBC WITH DIFFERENTIAL (CANCER CENTER ONLY)
Abs Immature Granulocytes: 0.05 10*3/uL (ref 0.00–0.07)
Basophils Absolute: 0 10*3/uL (ref 0.0–0.1)
Basophils Relative: 1 %
Eosinophils Absolute: 0.2 10*3/uL (ref 0.0–0.5)
Eosinophils Relative: 3 %
HCT: 41 % (ref 36.0–46.0)
Hemoglobin: 13.3 g/dL (ref 12.0–15.0)
Immature Granulocytes: 1 %
Lymphocytes Relative: 41 %
Lymphs Abs: 2.1 10*3/uL (ref 0.7–4.0)
MCH: 25.1 pg — ABNORMAL LOW (ref 26.0–34.0)
MCHC: 32.4 g/dL (ref 30.0–36.0)
MCV: 77.4 fL — ABNORMAL LOW (ref 80.0–100.0)
Monocytes Absolute: 0.5 10*3/uL (ref 0.1–1.0)
Monocytes Relative: 9 %
Neutro Abs: 2.4 10*3/uL (ref 1.7–7.7)
Neutrophils Relative %: 45 %
Platelet Count: 245 10*3/uL (ref 150–400)
RBC: 5.3 MIL/uL — ABNORMAL HIGH (ref 3.87–5.11)
RDW: 15.3 % (ref 11.5–15.5)
WBC Count: 5.2 10*3/uL (ref 4.0–10.5)
nRBC: 0 % (ref 0.0–0.2)

## 2022-11-07 LAB — RETICULOCYTES
Immature Retic Fract: 6.5 % (ref 2.3–15.9)
RBC.: 5.34 MIL/uL — ABNORMAL HIGH (ref 3.87–5.11)
Retic Count, Absolute: 59.8 10*3/uL (ref 19.0–186.0)
Retic Ct Pct: 1.1 % (ref 0.4–3.1)
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
## 2023-01-06 ENCOUNTER — Encounter: Payer: Self-pay | Admitting: Family Medicine

## 2023-01-06 ENCOUNTER — Telehealth (INDEPENDENT_AMBULATORY_CARE_PROVIDER_SITE_OTHER): Payer: 59 | Admitting: Family Medicine

## 2023-01-06 DIAGNOSIS — R053 Chronic cough: Secondary | ICD-10-CM

## 2023-01-06 MED ORDER — ALBUTEROL SULFATE HFA 108 (90 BASE) MCG/ACT IN AERS
INHALATION_SPRAY | RESPIRATORY_TRACT | 0 refills | Status: AC
Start: 2023-01-06 — End: ?

## 2023-01-06 NOTE — Progress Notes (Signed)
Chief Complaint  Patient presents with   Fatigue    No energy    Cough    Andrea Briggs is 47 y.o. and is here for a cough. We are interacting via web portal for an electronic face-to-face visit. I verified patient's ID using 2 identifiers. Patient agreed to proceed with visit via this method. Patient is at home, I am at office. Patient and I are present for visit.   Duration: 1  year Productive? No Associated symptoms: coughing when laying down at night Denies: fever, rhinorrhea, facial pain, chest pain, hemoptysis, dyspnea, wheezing Hx of GERD? No- was on omeprazole from ENT. Did not help.  No smoking. ACEi? No  Past Medical History:  Diagnosis Date   Anemia    Anxiety    Chronic kidney disease (CKD)    follows w nephro   Chronic pain    DDD (degenerative disc disease), cervical    DDD (degenerative disc disease), lumbar    Depression    Menorrhagia with irregular cycle 10/17/2020   Migraine    MS (multiple sclerosis) (HCC)    Neuromuscular disorder (HCC)    MS dx 2016   Vertigo    Family History  Problem Relation Age of Onset   Cancer Maternal Grandmother        breast cancer   Stroke Maternal Grandfather    Colon cancer Neg Hx    Esophageal cancer Neg Hx    Rectal cancer Neg Hx    Stomach cancer Neg Hx    Allergies as of 01/06/2023       Reactions   Ketorolac Other (See Comments)   Renal insufficiency   Metoprolol Swelling   Nsaids Other (See Comments)   nephropathy        Medication List        Accurate as of January 06, 2023  3:13 PM. If you have any questions, ask your nurse or doctor.          STOP taking these medications    HYDROcodone bit-homatropine 5-1.5 MG/5ML syrup Commonly known as: HYCODAN Stopped by: Sharlene Dory, DO   omeprazole 40 MG capsule Commonly known as: PRILOSEC Stopped by: Sharlene Dory, DO   tiZANidine 2 MG tablet Commonly known as: ZANAFLEX Stopped by: Sharlene Dory, DO        TAKE these medications    acetaminophen 325 MG tablet Commonly known as: Tylenol Take 2 tablets (650 mg total) by mouth every 6 (six) hours as needed.   albuterol 108 (90 Base) MCG/ACT inhaler Commonly known as: VENTOLIN HFA Take 1-2 puffs before bed as needed for cough. Started by: Sharlene Dory, DO   amitriptyline 25 MG tablet Commonly known as: ELAVIL Take 2 tablets (50 mg total) by mouth at bedtime.   fluticasone 50 MCG/ACT nasal spray Commonly known as: FLONASE Place 2 sprays into both nostrils daily.   folic acid 1 MG tablet Commonly known as: FOLVITE Take 1 tablet (1 mg total) by mouth daily.   hydrOXYzine 25 MG tablet Commonly known as: ATARAX Take 1 tablet (25 mg total) by mouth 2 (two) times daily.   ibuprofen 800 MG tablet Commonly known as: ADVIL Take by mouth.   levocetirizine 5 MG tablet Commonly known as: XYZAL Take 1 tablet (5 mg total) by mouth every evening.   meclizine 25 MG tablet Commonly known as: ANTIVERT Take 25 mg by mouth 3 (three) times daily as needed.   meloxicam 7.5 MG tablet Commonly known  as: MOBIC Take 1 tablet (7.5 mg total) by mouth daily.   montelukast 10 MG tablet Commonly known as: SINGULAIR Take 1 tablet (10 mg total) by mouth at bedtime.   multivitamin capsule Take 1 capsule by mouth daily.   OCRELIZUMAB IV Inject 1 Dose into the vein every 6 (six) months.   ondansetron 4 MG disintegrating tablet Commonly known as: ZOFRAN-ODT Take 1 tablet (4 mg total) by mouth every 8 (eight) hours as needed for nausea or vomiting.   oxybutynin 10 MG 24 hr tablet Commonly known as: DITROPAN-XL Take 1 tablet (10 mg total) by mouth at bedtime.   solifenacin 5 MG tablet Commonly known as: VESICARE Take 1 tablet (5 mg total) by mouth daily.   sucralfate 1 g tablet Commonly known as: Carafate Take 1 tablet (1 g total) by mouth 4 (four) times daily -  with meals and at bedtime for 7 days.   traZODone 50 MG  tablet Commonly known as: DESYREL Take by mouth at bedtime as needed for sleep. Unsure dose   valACYclovir 1000 MG tablet Commonly known as: VALTREX Take 1 tablet (1,000 mg total) by mouth 3 (three) times daily.   Vitamin D (Ergocalciferol) 1.25 MG (50000 UNIT) Caps capsule Commonly known as: DRISDOL Take 50,000 Units by mouth once a week.   zonisamide 100 MG capsule Commonly known as: ZONEGRAN Take 300 mg by mouth daily.       Exam No conversational dyspnea Age appropriate judgment and insight Nml affect and mood   Chronic cough - Plan: albuterol (VENTOLIN HFA) 108 (90 Base) MCG/ACT inhaler, Ambulatory referral to Pulmonology  Chronic, unstable. Scope showed cyst of VC, removed via surgery and still having issues. On PPI. Will refer to pulm and trial SABA in meanwhile. Doesn't sound like UACS.  The patient voiced understanding and agreement to the plan.  Jilda Roche [place], DO 01/06/23 3:13 PM

## 2023-01-21 ENCOUNTER — Telehealth (INDEPENDENT_AMBULATORY_CARE_PROVIDER_SITE_OTHER): Payer: 59 | Admitting: Family Medicine

## 2023-01-21 ENCOUNTER — Encounter: Payer: Self-pay | Admitting: Family Medicine

## 2023-01-21 DIAGNOSIS — R053 Chronic cough: Secondary | ICD-10-CM

## 2023-01-21 NOTE — Progress Notes (Signed)
Chief Complaint  Patient presents with   Follow-up    Subjective: Patient is a 47 y.o. female here for f/u. We are interacting via web portal for an electronic face-to-face visit. I verified patient's ID using 2 identifiers. Patient agreed to proceed with visit via this method. Patient is at home, I am at office. Patient and I are present for visit.   Patient is dealing with a chronic cough.  She was started on albuterol to take nightly as asked when her cough is the worst.  She notes significant improvement since starting.  She has been taking it nightly and notices improvement even throughout the day.  She has no adverse effects with this medication.  She is wondering what the next steps are.  She has never smoked.  She did not end up scheduling her appointment with the pulmonology team as she does not have access to MyChart currently.  Past Medical History:  Diagnosis Date   Anemia    Anxiety    Chronic kidney disease (CKD)    follows w nephro   Chronic pain    DDD (degenerative disc disease), cervical    DDD (degenerative disc disease), lumbar    Depression    Menorrhagia with irregular cycle 10/17/2020   Migraine    MS (multiple sclerosis) (HCC)    Neuromuscular disorder (HCC)    MS dx 2016   Vertigo     Objective: No conversational dyspnea Age appropriate judgment and insight Nml affect and mood  Assessment and Plan: Chronic cough - Plan: Pulmonary function test  Continue albuterol for now.  Check a PFT, this is likely asthma based off of her response and nighttime symptoms but would want to rule out ILD and COPD.  Will make sure to request a methacholine challenge and SABA/reversibility.  Follow-up pending the above results. The patient voiced understanding and agreement to the plan.  Jilda Roche [place], DO 01/21/23  3:27 PM

## 2023-01-21 NOTE — Addendum Note (Signed)
Addended by: Radene Gunning on: 01/21/2023 05:05 PM   Modules accepted: Level of Service

## 2023-02-18 ENCOUNTER — Other Ambulatory Visit: Payer: Self-pay | Admitting: Family Medicine

## 2023-03-25 ENCOUNTER — Telehealth (INDEPENDENT_AMBULATORY_CARE_PROVIDER_SITE_OTHER): Payer: 59 | Admitting: Family Medicine

## 2023-03-25 ENCOUNTER — Encounter: Payer: Self-pay | Admitting: Family Medicine

## 2023-03-25 DIAGNOSIS — J209 Acute bronchitis, unspecified: Secondary | ICD-10-CM

## 2023-03-25 MED ORDER — BENZONATATE 200 MG PO CAPS: 200.0000 mg | ORAL_CAPSULE | Freq: Two times a day (BID) | ORAL | 0 refills | Status: DC | PRN

## 2023-03-25 NOTE — Progress Notes (Signed)
Chief Complaint  Patient presents with   Nasal Congestion    Andrea Briggs here for URI complaints. We are interacting via web portal for an electronic face-to-face visit. I verified patient's ID using 2 identifiers. Patient agreed to proceed with visit via this method. Patient is at home, I am at office. Patient and I are present for visit.   Duration: 3 weeks  Associated symptoms: sinus congestion, rhinorrhea, and coughing Denies: sinus pain, itchy watery eyes, ear pain, ear drainage, sore throat, wheezing, shortness of breath, myalgia, and N/V, loss of taste/smell Treatment to date: Mucinex, saline nasal spray Sick contacts: No  Past Medical History:  Diagnosis Date   Anemia    Anxiety    Chronic kidney disease (CKD)    follows w nephro   Chronic pain    DDD (degenerative disc disease), cervical    DDD (degenerative disc disease), lumbar    Depression    Menorrhagia with irregular cycle 10/17/2020   Migraine    MS (multiple sclerosis) (HCC)    Neuromuscular disorder (HCC)    MS dx 2016   Vertigo     Objective No conversational dyspnea Age appropriate judgment and insight Nml affect and mood  Acute bronchitis, unspecified organism - Plan: benzonatate (TESSALON) 200 MG capsule  Starting to improve. Still coughing. Tessalon Perles prn. Send message if getting worse. Continue to push fluids, practice good hand hygiene, cover mouth when coughing. F/u prn. If starting to experience fevers, shaking, or shortness of breath, seek immediate care. Pt voiced understanding and agreement to the plan.  Jilda Roche [place], DO 03/25/23 11:38 AM

## 2023-04-03 ENCOUNTER — Ambulatory Visit (HOSPITAL_BASED_OUTPATIENT_CLINIC_OR_DEPARTMENT_OTHER)
Admission: RE | Admit: 2023-04-03 | Discharge: 2023-04-03 | Disposition: A | Discharge status: Home / Self Care | Payer: 59 | Source: Ambulatory Visit | Attending: Family Medicine

## 2023-04-03 ENCOUNTER — Encounter: Payer: Self-pay | Admitting: Family Medicine

## 2023-04-03 ENCOUNTER — Ambulatory Visit (INDEPENDENT_AMBULATORY_CARE_PROVIDER_SITE_OTHER): Payer: 59 | Admitting: Family Medicine

## 2023-04-03 VITALS — BP 112/84 | HR 94 | Temp 98.4°F | Ht 66.0 in | Wt 210.0 lb

## 2023-04-03 DIAGNOSIS — R053 Chronic cough: Secondary | ICD-10-CM | POA: Diagnosis not present

## 2023-04-03 DIAGNOSIS — J0140 Acute pansinusitis, unspecified: Secondary | ICD-10-CM

## 2023-04-03 MED ORDER — AMOXICILLIN-POT CLAVULANATE 875-125 MG PO TABS: 1.0000 | ORAL_TABLET | Freq: Two times a day (BID) | ORAL | 0 refills | Status: DC

## 2023-04-03 NOTE — Progress Notes (Signed)
Acute Office Visit  Subjective:     Patient ID: Andrea Briggs, female    DOB: May 09, 1976, 47 y.o.   MRN: [id number]  Chief Complaint  Patient presents with   Cough     Patient is in today for cough, sinus pressure.   Discussed the use of AI scribe software for clinical note transcription with the patient, who gave verbal consent to proceed.  History of Present Illness   The patient has been experiencing a persistent cough for approximately a year. 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 Recently, the cough has changed from being dry to productive. The patient reports a significant amount of morning drainage, which they initially attributed to sinus issues. These new symptoms have been ongoing for about three weeks.  The patient sought treatment for this cough about a week ago and was prescribed Tessalon Perles. However, due to insurance coverage issues, the patient has been using over-the-counter Delsym instead. The patient reports that the cough has not improved and has become productive again over the past three weeks, with mucus being coughed up. The patient also reports sinus pressure in the cheeks and teeth, and occasional headaches.  The patient has not been on any antibiotics for this cough since it started. They have tried an inhaler for nighttime coughing, which provided relief for about two weeks before the cough returned and became persistent throughout the day. The patient denies any history of smoking, coughing up blood, unexplained weight loss, or unusual fatigue beyond what is expected with their existing diagnosis of multiple sclerosis. The patient has not had a chest x-ray for this chronic cough.           All review of systems negative except what is listed in the HPI      Objective:    BP 112/84   Pulse 94   Temp 98.4 F (36.9 C) (Oral)   Ht 5\' 6"  (1.676 m)   Wt 210 lb (95.3 kg)   SpO2 97%   BMI 33.89  kg/m    Physical Exam Vitals reviewed.  Constitutional:      Appearance: Normal appearance.  HENT:     Head: Normocephalic and atraumatic.     Nose: Nose normal.     Mouth/Throat:     Mouth: Mucous membranes are moist.     Pharynx: Oropharynx is clear. No posterior oropharyngeal erythema.  Cardiovascular:     Rate and Rhythm: Normal rate and regular rhythm.     Heart sounds: Normal heart sounds.  Pulmonary:     Effort: Pulmonary effort is normal.     Breath sounds: Normal breath sounds. No wheezing, rhonchi or rales.  Musculoskeletal:     Cervical back: Normal range of motion and neck supple.  Skin:    General: Skin is warm and dry.  Neurological:     Mental Status: She is alert and oriented to person, place, and time.  Psychiatric:        Mood and Affect: Mood normal.        Behavior: Behavior normal.        Thought Content: Thought content normal.        Judgment: Judgment normal.         No results found for any visits on 04/03/23.      Assessment & Plan:   Problem List Items Addressed This Visit       Active Problems   Chronic cough -  Primary   Relevant Orders   DG Chest 2 View   Other Visit Diagnoses     Acute non-recurrent pansinusitis       Relevant Medications   amoxicillin-clavulanate (AUGMENTIN) 875-125 MG tablet         Chronic Cough and Sinusitis Persistent cough for over a year, initially productive, now dry. Recent 3 weeks of increased nasal congestion, sinus pressure, and mucus production. No recent antibiotics. No history of smoking. No coughing up blood or unexplained weight loss. -Order chest x-ray to investigate cause of chronic cough. -Prescribe Augmentin to address possible sinus infection -Advise to continue Delsym, cough drops, and add Mucinex to regimen. -Recommend probiotic intake to maintain gut flora during antibiotic treatment. -Advise to increase fluid intake and use steam/humidifier to aid in mucus clearance.          Meds ordered this encounter  Medications   amoxicillin-clavulanate (AUGMENTIN) 875-125 MG tablet    Sig: Take 1 tablet by mouth 2 (two) times daily.    Dispense:  20 tablet    Refill:  0    Order Specific Question:   Supervising Provider    Answer:   Danise Edge A [[id number]]    Return if symptoms worsen or fail to improve.  Clayborne Dana, NP

## 2023-05-08 ENCOUNTER — Ambulatory Visit: Payer: 59 | Admitting: Family

## 2023-05-08 ENCOUNTER — Inpatient Hospital Stay: Payer: 59

## 2023-05-12 ENCOUNTER — Inpatient Hospital Stay: Payer: 59 | Attending: Family

## 2023-05-12 ENCOUNTER — Inpatient Hospital Stay: Payer: 59 | Admitting: Medical Oncology

## 2023-07-21 ENCOUNTER — Encounter: Payer: Self-pay | Admitting: Physician Assistant

## 2023-07-21 ENCOUNTER — Telehealth (INDEPENDENT_AMBULATORY_CARE_PROVIDER_SITE_OTHER): Payer: 59 | Admitting: Physician Assistant

## 2023-07-21 DIAGNOSIS — N3000 Acute cystitis without hematuria: Secondary | ICD-10-CM

## 2023-07-21 MED ORDER — SULFAMETHOXAZOLE-TRIMETHOPRIM 800-160 MG PO TABS: 1.0000 | ORAL_TABLET | Freq: Two times a day (BID) | ORAL | 0 refills | Status: AC

## 2023-07-21 NOTE — Progress Notes (Signed)
 MyChart Video Visit    Virtual Visit via Video Note   This format is felt to be most appropriate for this patient at this time. Physical exam was limited by quality of the video and audio technology used for the visit.   Patient location: home Provider location: [email or web address]  I discussed the limitations of evaluation and management by telemedicine and the availability of in person appointments. The patient expressed understanding and agreed to proceed.  Patient: Andrea Briggs   DOB: [birth year]   47 y.o. Female  MRN: [id number] Visit Date: 07/21/2023  Today's healthcare provider: Manuelita Flatness, PA-C   Cc. Low back pain, urinary frequency.  Subjective    HPI  Pt reports low back pain, urinary frequency, for the last 3 days. Reports these symptoms are typical with bladder infections for her, and she worries as she has had kidney infections in the past. Denies fever, dysuria, hematuria, N/V.  Medications: Outpatient Medications Prior to Visit  Medication Sig   acetaminophen  (TYLENOL ) 325 MG tablet Take 2 tablets (650 mg total) by mouth every 6 (six) hours as needed.   albuterol  (VENTOLIN  HFA) 108 (90 Base) MCG/ACT inhaler Take 1-2 puffs before bed as needed for cough.   amitriptyline  (ELAVIL ) 25 MG tablet Take 2 tablets (50 mg total) by mouth at bedtime.   amoxicillin -clavulanate (AUGMENTIN ) 875-125 MG tablet Take 1 tablet by mouth 2 (two) times daily.   fluticasone  (FLONASE ) 50 MCG/ACT nasal spray Place 2 sprays into both nostrils daily.   folic acid  (FOLVITE ) 1 MG tablet Take 1 tablet (1 mg total) by mouth daily.   ibuprofen  (ADVIL ) 800 MG tablet Take by mouth.   levocetirizine (XYZAL ) 5 MG tablet Take 1 tablet (5 mg total) by mouth every evening.   meclizine  (ANTIVERT ) 25 MG tablet Take 25 mg by mouth 3 (three) times daily as needed.   meloxicam  (MOBIC ) 7.5 MG tablet Take 1 tablet (7.5 mg total) by mouth daily.   montelukast  (SINGULAIR ) 10 MG tablet Take 1 tablet (10 mg total)  by mouth at bedtime.   Multiple Vitamin (MULTIVITAMIN) capsule Take 1 capsule by mouth daily.   OCRELIZUMAB  IV Inject 1 Dose into the vein every 6 (six) months.   ondansetron  (ZOFRAN -ODT) 4 MG disintegrating tablet DISSOLVE 1 TABLET IN MOUTH EVERY 8 HOURS AS NEEDED FOR NAUSEA AND VOMITING   oxybutynin  (DITROPAN -XL) 10 MG 24 hr tablet Take 1 tablet (10 mg total) by mouth at bedtime.   solifenacin  (VESICARE ) 5 MG tablet Take 1 tablet (5 mg total) by mouth daily.   traZODone (DESYREL) 50 MG tablet Take by mouth at bedtime as needed for sleep. Unsure dose   valACYclovir  (VALTREX ) 1000 MG tablet Take 1 tablet (1,000 mg total) by mouth 3 (three) times daily.   Vitamin D, Ergocalciferol, (DRISDOL) 1.25 MG (50000 UNIT) CAPS capsule Take 50,000 Units by mouth once a week.   zonisamide (ZONEGRAN) 100 MG capsule Take 300 mg by mouth daily.   Facility-Administered Medications Prior to Visit  Medication Dose Route Frequency Provider   0.9 %  sodium chloride  infusion  500 mL Intravenous Continuous Cirigliano, Vito V, DO    Review of Systems  Constitutional:  Negative for fatigue and fever.  Respiratory:  Negative for cough and shortness of breath.   Cardiovascular:  Negative for chest pain and leg swelling.  Gastrointestinal:  Negative for abdominal pain.  Genitourinary:  Positive for frequency.  Musculoskeletal:  Positive for back pain.  Neurological:  Negative for dizziness and headaches.  Objective    There were no vitals taken for this visit.      Physical Exam Constitutional:      Appearance: Normal appearance.  Neurological:     Mental Status: She is oriented to person, place, and time.  Psychiatric:        Mood and Affect: Mood normal.        Behavior: Behavior normal.        Assessment & Plan     1. Acute cystitis without hematuria (Primary) Will tx empirically, rx bactrim  bid x 5 days Advised if symptoms persist or worsen to f/u in office for exam/to provide  urine sample - sulfamethoxazole -trimethoprim  (BACTRIM  DS) 800-160 MG tablet; Take 1 tablet by mouth 2 (two) times daily for 5 days.  Dispense: 10 tablet; Refill: 0   Return if symptoms worsen or fail to improve.     I discussed the assessment and treatment plan with the patient. The patient was provided an opportunity to ask questions and all were answered. The patient agreed with the plan and demonstrated an understanding of the instructions.   The patient was advised to call back or seek an in-person evaluation if the symptoms worsen or if the condition fails to improve as anticipated.  I provided 5 minutes of non-face-to-face time during this encounter.  Manuelita Flatness, PA-C [facility] [phone] (phone) [phone] (fax)  [facility]

## 2023-08-03 ENCOUNTER — Encounter: Payer: Self-pay | Admitting: Family

## 2023-08-13 ENCOUNTER — Encounter: Payer: Self-pay | Admitting: Family

## 2023-08-14 ENCOUNTER — Other Ambulatory Visit: Payer: Self-pay | Admitting: Family Medicine

## 2023-08-14 DIAGNOSIS — N3281 Overactive bladder: Secondary | ICD-10-CM

## 2023-08-14 MED ORDER — OXYBUTYNIN CHLORIDE ER 10 MG PO TB24
10.0000 mg | ORAL_TABLET | Freq: Every day | ORAL | 0 refills | Status: DC
Start: 2023-08-14 — End: 2023-09-08

## 2023-08-14 MED ORDER — ONDANSETRON 4 MG PO TBDP: 4.0000 mg | ORAL_TABLET | Freq: Three times a day (TID) | ORAL | 0 refills | Status: DC | PRN

## 2023-08-14 NOTE — Telephone Encounter (Signed)
Copied from CRM [id number]. Topic: Clinical - Medication Refill >> Aug 14, 2023 12:59 PM Orinda Kenner C wrote: Most Recent Primary Care Visit:  Provider: Alfredia Ferguson  Department: [facility]  Visit Type: MYCHART VIDEO VISIT  Date: 07/21/2023  Medication: Oxybutynin (DITROPAN-XL) 10 MG 24 hr tablet 1 tablet daily and Ondansetron (ZOFRAN-ODT) 4 MG disintegrating tablet 1 tablet every morning  Has the patient contacted their pharmacy? Yes (Agent: If no, request that the patient contact the pharmacy for the refill. If patient does not wish to contact the pharmacy document the reason why and proceed with request.) (Agent: If yes, when and what did the pharmacy advise?)  Is this the correct pharmacy for this prescription? Yes If no, delete pharmacy and type the correct one.  This is the patient's preferred pharmacy:  [id number], [place] - [address] AT [facility] [address] [place] [address] Phone: [phone] Fax: [phone]   Has the prescription been filled recently? No  Is the patient out of the medication? Yes  Has the patient been seen for an appointment in the last year OR does the patient have an upcoming appointment? Yes  Can we respond through MyChart? No, call [phone] Agent: Please be advised that Rx refills may take up to 3 business days. We ask that you follow-up with your pharmacy.

## 2023-08-18 ENCOUNTER — Ambulatory Visit: Payer: 59 | Admitting: Family Medicine

## 2023-08-20 ENCOUNTER — Ambulatory Visit: Payer: 59

## 2023-08-20 VITALS — Ht 66.0 in | Wt 210.0 lb

## 2023-08-20 DIAGNOSIS — Z0000 Encounter for general adult medical examination without abnormal findings: Secondary | ICD-10-CM

## 2023-08-20 NOTE — Patient Instructions (Addendum)
Andrea Briggs , Thank you for taking time to come for your Medicare Wellness Visit. I appreciate your ongoing commitment to your health goals. Please review the following plan we discussed and let me know if I can assist you in the future.   Referrals/Orders/Follow-Ups/Clinician Recommendations:   This is a list of the screening recommended for you and due dates:  Health Maintenance  Topic Date Due   Hepatitis C Screening  Never done   Pap with HPV screening  Never done   COVID-19 Vaccine (3 - Moderna risk series) 03/25/2020   Flu Shot  02/19/2023   Medicare Annual Wellness Visit  08/19/2024   DTaP/Tdap/Td vaccine (2 - Td or Tdap) 08/11/2029   Colon Cancer Screening  05/07/2030   HIV Screening  Completed   HPV Vaccine  Aged Out    Advanced directives: (Declined) Advance directive discussed with you today. Even though you declined this today, please call our office should you change your mind, and we can give you the proper paperwork for you to fill out.  Next Medicare Annual Wellness Visit scheduled for next year: Yes

## 2023-08-20 NOTE — Progress Notes (Signed)
Subjective:   Andrea Briggs is a 48 y.o. female who presents for Medicare Annual (Subsequent) preventive examination.  Visit Complete: Virtual I connected with  Andrea Briggs on 08/20/23 by a audio enabled telemedicine application and verified that I am speaking with the correct person using two identifiers.  Patient Location: Home  Provider Location: Home Office  I discussed the limitations of evaluation and management by telemedicine. The patient expressed understanding and agreed to proceed.  Vital Signs: Because this visit was a virtual/telehealth visit, some criteria may be missing or patient reported. Any vitals not documented were not able to be obtained and vitals that have been documented are patient reported.   Cardiac Risk Factors include: advanced age (>23men, >80 women);hypertension     Objective:    Today's Vitals   08/20/23 1513  Weight: 210 lb (95.3 kg)  Height: 5\' 6"  (1.676 m)   Body mass index is 33.89 kg/m.     08/20/2023    3:23 PM 11/07/2022    8:43 AM 06/04/2022    9:42 AM 04/20/2022    4:52 AM 03/24/2022    7:58 PM 01/28/2022   11:35 PM 01/17/2022    3:00 PM  Advanced Directives  Does Patient Have a Medical Advance Directive? No No No No No No No  Would patient like information on creating a medical advance directive? No - Patient declined  No - Patient declined No - Patient declined No - Patient declined  No - Patient declined    Current Medications (verified) Outpatient Encounter Medications as of 08/20/2023  Medication Sig   acetaminophen (TYLENOL) 325 MG tablet Take 2 tablets (650 mg total) by mouth every 6 (six) hours as needed.   albuterol (VENTOLIN HFA) 108 (90 Base) MCG/ACT inhaler Take 1-2 puffs before bed as needed for cough.   amitriptyline (ELAVIL) 25 MG tablet Take 2 tablets (50 mg total) by mouth at bedtime.   amoxicillin-clavulanate (AUGMENTIN) 875-125 MG tablet Take 1 tablet by mouth 2 (two) times daily. (Patient not taking:  Reported on 08/20/2023)   fluticasone (FLONASE) 50 MCG/ACT nasal spray Place 2 sprays into both nostrils daily. (Patient not taking: Reported on 08/20/2023)   folic acid (FOLVITE) 1 MG tablet Take 1 tablet (1 mg total) by mouth daily. (Patient not taking: Reported on 08/20/2023)   ibuprofen (ADVIL) 800 MG tablet Take by mouth.   levocetirizine (XYZAL) 5 MG tablet Take 1 tablet (5 mg total) by mouth every evening. (Patient not taking: Reported on 08/20/2023)   meclizine (ANTIVERT) 25 MG tablet Take 25 mg by mouth 3 (three) times daily as needed.   meloxicam (MOBIC) 7.5 MG tablet Take 1 tablet (7.5 mg total) by mouth daily. (Patient not taking: Reported on 08/20/2023)   montelukast (SINGULAIR) 10 MG tablet Take 1 tablet (10 mg total) by mouth at bedtime. (Patient not taking: Reported on 08/20/2023)   Multiple Vitamin (MULTIVITAMIN) capsule Take 1 capsule by mouth daily.   OCRELIZUMAB IV Inject 1 Dose into the vein every 6 (six) months.   ondansetron (ZOFRAN-ODT) 4 MG disintegrating tablet Take 1 tablet (4 mg total) by mouth every 8 (eight) hours as needed for nausea or vomiting. (Patient not taking: Reported on 08/20/2023)   oxybutynin (DITROPAN-XL) 10 MG 24 hr tablet Take 1 tablet (10 mg total) by mouth at bedtime.   solifenacin (VESICARE) 5 MG tablet Take 1 tablet (5 mg total) by mouth daily.   traZODone (DESYREL) 50 MG tablet Take by mouth at bedtime as needed  for sleep. Unsure dose   valACYclovir (VALTREX) 1000 MG tablet Take 1 tablet (1,000 mg total) by mouth 3 (three) times daily. (Patient not taking: Reported on 08/20/2023)   Vitamin D, Ergocalciferol, (DRISDOL) 1.25 MG (50000 UNIT) CAPS capsule Take 50,000 Units by mouth once a week.   zonisamide (ZONEGRAN) 100 MG capsule Take 300 mg by mouth daily.   Facility-Administered Encounter Medications as of 08/20/2023  Medication   0.9 %  sodium chloride infusion    Allergies (verified) Ketorolac, Metoprolol, and Nsaids   History: Past Medical  History:  Diagnosis Date   Anemia    Anxiety    Chronic kidney disease (CKD)    follows w nephro   Chronic pain    DDD (degenerative disc disease), cervical    DDD (degenerative disc disease), lumbar    Depression    Menorrhagia with irregular cycle 10/17/2020   Migraine    MS (multiple sclerosis) (HCC)    Neuromuscular disorder (HCC)    MS dx 2016   Vertigo    Past Surgical History:  Procedure Laterality Date   carpel tunnel     HERNIA REPAIR     Family History  Problem Relation Age of Onset   Cancer Maternal Grandmother        breast cancer   Stroke Maternal Grandfather    Colon cancer Neg Hx    Esophageal cancer Neg Hx    Rectal cancer Neg Hx    Stomach cancer Neg Hx    Social History   Socioeconomic History   Marital status: Married    Spouse name: Not on file   Number of children: 1   Years of education: Not on file   Highest education level: Not on file  Occupational History   Not on file  Tobacco Use   Smoking status: Never   Smokeless tobacco: Never  Vaping Use   Vaping status: Never Used  Substance and Sexual Activity   Alcohol use: No   Drug use: No   Sexual activity: Yes    Partners: Male    Birth control/protection: None  Other Topics Concern   Not on file  Social History Narrative   Not on file   Social Drivers of Health   Financial Resource Strain: Low Risk  (08/20/2023)   Overall Financial Resource Strain (CARDIA)    Difficulty of Paying Living Expenses: Not hard at all  Food Insecurity: No Food Insecurity (08/20/2023)   Hunger Vital Sign    Worried About Running Out of Food in the Last Year: Never true    Ran Out of Food in the Last Year: Never true  Transportation Needs: No Transportation Needs (08/20/2023)   PRAPARE - Administrator, Civil Service (Medical): No    Lack of Transportation (Non-Medical): No  Physical Activity: Insufficiently Active (08/20/2023)   Exercise Vital Sign    Days of Exercise per Week: 3 days     Minutes of Exercise per Session: 20 min  Stress: No Stress Concern Present (08/20/2023)   [employer] of Occupational Health - Occupational Stress Questionnaire    Feeling of Stress : Not at all  Social Connections: Socially Integrated (08/20/2023)   Social Connection and Isolation Panel [NHANES]    Frequency of Communication with Friends and Family: More than three times a week    Frequency of Social Gatherings with Friends and Family: More than three times a week    Attends Religious Services: More than 4 times per year  Active Member of Clubs or Organizations: Yes    Attends Engineer, structural: More than 4 times per year    Marital Status: Married    Tobacco Counseling Counseling given: Not Answered   Clinical Intake:  Pre-visit preparation completed: Yes  Pain : No/denies pain     BMI - recorded: 33.89 Nutritional Status: BMI > 30  Obese Nutritional Risks: None Diabetes: No  How often do you need to have someone help you when you read instructions, pamphlets, or other written materials from your doctor or pharmacy?: 1 - Never  Interpreter Needed?: No  Information entered by :: Theresa Mulligan LPN   Activities of Daily Living    08/20/2023    3:22 PM  In your present state of health, do you have any difficulty performing the following activities:  Hearing? 0  Vision? 0  Difficulty concentrating or making decisions? 0  Walking or climbing stairs? 0  Dressing or bathing? 0  Doing errands, shopping? 0  Preparing Food and eating ? N  Using the Toilet? N  In the past six months, have you accidently leaked urine? N  Do you have problems with loss of bowel control? N  Managing your Medications? N  Managing your Finances? N  Housekeeping or managing your Housekeeping? N    Patient Care Team: Sharlene Dory, DO as PCP - General (Family Medicine)  Indicate any recent Medical Services you may have received from other than [facility] providers in  the past year (date may be approximate).     Assessment:   This is a routine wellness examination for [place].  Hearing/Vision screen Hearing Screening - Comments:: Denies hearing difficulties   Vision Screening - Comments:: Wears rx glasses - up to date with routine eye exams with  [facility]   Goals Addressed               This Visit's Progress     Increase physical activity (pt-stated)        Stay Active an healthy.       Depression Screen    08/20/2023    3:22 PM 06/04/2022    9:44 AM 03/31/2022    2:59 PM 03/26/2021   11:38 AM 10/31/2020    8:23 AM  PHQ 2/9 Scores  PHQ - 2 Score 0 2 0 0 0    Fall Risk    08/20/2023    3:22 PM 08/29/2022   10:27 AM 06/04/2022    9:42 AM 03/26/2021   11:37 AM  Fall Risk   Falls in the past year? 1 0 0 1  Number falls in past yr: 0 0 0 1  Injury with Fall? 0 0 0 0  Risk for fall due to : No Fall Risks  No Fall Risks History of fall(s)  Follow up Falls prevention discussed  Falls evaluation completed Falls prevention discussed    MEDICARE RISK AT HOME: Medicare Risk at Home Any stairs in or around the home?: Yes If so, are there any without handrails?: No Home free of loose throw rugs in walkways, pet beds, electrical cords, etc?: Yes Adequate lighting in your home to reduce risk of falls?: Yes Life alert?: No Use of a cane, walker or w/c?: No Grab bars in the bathroom?: No Shower chair or bench in shower?: No Elevated toilet seat or a handicapped toilet?: No  TIMED UP AND GO:  Was the test performed?  No    Cognitive Function:  08/20/2023    3:23 PM 06/04/2022    9:49 AM  6CIT Screen  What Year? 0 points 0 points  What month? 0 points 0 points  What time? 0 points 0 points  Count back from 20 0 points 0 points  Months in reverse 0 points 0 points  Repeat phrase 0 points 4 points  Total Score 0 points 4 points    Immunizations Immunization History  Administered Date(s) Administered    Influenza,inj,Quad PF,6+ Mos 06/26/2017, 04/30/2018, 08/12/2019, 05/11/2020, 05/16/2021, 05/09/2022   Moderna Sars-Covid-2 Vaccination 01/28/2020, 02/26/2020   Tdap 08/12/2019    TDAP status: Up to date  Flu Vaccine status: Declined, Education has been provided regarding the importance of this vaccine but patient still declined. Advised may receive this vaccine at local pharmacy or Health Dept. Aware to provide a copy of the vaccination record if obtained from local pharmacy or Health Dept. Verbalized acceptance and understanding.    Covid-19 vaccine status: Declined, Education has been provided regarding the importance of this vaccine but patient still declined. Advised may receive this vaccine at local pharmacy or Health Dept.or vaccine clinic. Aware to provide a copy of the vaccination record if obtained from local pharmacy or Health Dept. Verbalized acceptance and understanding.    Screening Tests Health Maintenance  Topic Date Due   Hepatitis C Screening  Never done   Cervical Cancer Screening (HPV/Pap Cotest)  Never done   COVID-19 Vaccine (3 - Moderna risk series) 03/25/2020   INFLUENZA VACCINE  02/19/2023   Medicare Annual Wellness (AWV)  08/19/2024   DTaP/Tdap/Td (2 - Td or Tdap) 08/11/2029   Colonoscopy  05/07/2030   HIV Screening  Completed   HPV VACCINES  Aged Out    Health Maintenance  Health Maintenance Due  Topic Date Due   Hepatitis C Screening  Never done   Cervical Cancer Screening (HPV/Pap Cotest)  Never done   COVID-19 Vaccine (3 - Moderna risk series) 03/25/2020   INFLUENZA VACCINE  02/19/2023    Colorectal cancer screening: Type of screening: Colonoscopy. Completed 05/07/20. Repeat every 10 years  Additional Screening:  Hepatitis C Screening: does qualify;  Deferred  Vision Screening: Recommended annual ophthalmology exams for early detection of glaucoma and other disorders of the eye. Is the patient up to date with their annual eye exam?  Yes  Who  is the provider or what is the name of the office in which the patient attends annual eye exams? [facility] If pt is not established with a provider, would they like to be referred to a provider to establish care? No .   Dental Screening: Recommended annual dental exams for proper oral hygiene    Community Resource Referral / Chronic Care Management:  CRR required this visit?  No   CCM required this visit?  No     Plan:     I have personally reviewed and noted the following in the patient's chart:   Medical and social history Use of alcohol, tobacco or illicit drugs  Current medications and supplements including opioid prescriptions. Patient is not currently taking opioid prescriptions. Functional ability and status Nutritional status Physical activity Advanced directives List of other physicians Hospitalizations, surgeries, and ER visits in previous 12 months Vitals Screenings to include cognitive, depression, and falls Referrals and appointments  In addition, I have reviewed and discussed with patient certain preventive protocols, quality metrics, and best practice recommendations. A written personalized care plan for preventive services as well as general preventive health recommendations were  provided to patient.     Tillie Rung, LPN   [id number]/82/[id number]   After Visit Summary: (MyChart) Due to this being a telephonic visit, the after visit summary with patients personalized plan was offered to patient via MyChart   Nurse Notes: None

## 2023-09-08 ENCOUNTER — Telehealth (INDEPENDENT_AMBULATORY_CARE_PROVIDER_SITE_OTHER): Payer: 59 | Admitting: Family Medicine

## 2023-09-08 ENCOUNTER — Encounter: Payer: Self-pay | Admitting: Family Medicine

## 2023-09-08 DIAGNOSIS — N3281 Overactive bladder: Secondary | ICD-10-CM | POA: Diagnosis not present

## 2023-09-08 DIAGNOSIS — G894 Chronic pain syndrome: Secondary | ICD-10-CM

## 2023-09-08 DIAGNOSIS — K219 Gastro-esophageal reflux disease without esophagitis: Secondary | ICD-10-CM | POA: Diagnosis not present

## 2023-09-08 MED ORDER — AMITRIPTYLINE HCL 25 MG PO TABS
25.0000 mg | ORAL_TABLET | Freq: Every day | ORAL | 2 refills | Status: AC
Start: 2023-09-08 — End: ?

## 2023-09-08 MED ORDER — ONDANSETRON 4 MG PO TBDP: 4.0000 mg | ORAL_TABLET | Freq: Three times a day (TID) | ORAL | 0 refills | Status: DC | PRN

## 2023-09-08 MED ORDER — PANTOPRAZOLE SODIUM 40 MG PO TBEC: 40.0000 mg | DELAYED_RELEASE_TABLET | Freq: Every day | ORAL | 1 refills | Status: AC

## 2023-09-08 MED ORDER — OXYBUTYNIN CHLORIDE ER 10 MG PO TB24
10.0000 mg | ORAL_TABLET | Freq: Every day | ORAL | 2 refills | Status: AC
Start: 2023-09-08 — End: ?

## 2023-09-08 NOTE — Progress Notes (Signed)
Chief Complaint  Patient presents with   Medication Refill    Medication check    Subjective: Patient is a 48 y.o. female here for f/u. We are interacting via web portal for an electronic face-to-face visit. I verified patient's ID using 2 identifiers. Patient agreed to proceed with visit via this method. Patient is at home, I am at office. Patient and I are present for visit.   Patient has a history of overactive bladder.  She is currently taking Ditropan XL 10 mg daily.  She reports compliance and no adverse effects.  It helps control her symptoms very well.  No bleeding, frequency, pain, or discharge.  Patient has a history of reflux.  She will get burning in her chest and irritation in her throat if she does not take her Protonix.  She is currently taking 40 mg daily.  She reports compliance no adverse effects.  She has no difficulty swallowing or blood in her stool.  No unintentional weight loss.  She will sometimes get nausea.  Patient has a history of chronic pain syndrome.  She used to be on amitriptyline which per old notes worked well.  She tolerated it well historically.  She stopped taking it as she forgot to refill it.  She has been having more body aches/pain lately.  She is interested in going back on this.  Past Medical History:  Diagnosis Date   Anemia    Anxiety    Chronic kidney disease (CKD)    follows w nephro   Chronic pain    DDD (degenerative disc disease), cervical    DDD (degenerative disc disease), lumbar    Depression    Menorrhagia with irregular cycle 10/17/2020   Migraine    MS (multiple sclerosis) (HCC)    Neuromuscular disorder (HCC)    MS dx 2016   Vertigo     Objective: No conversational dyspnea Age appropriate judgment and insight Nml affect and mood  Assessment and Plan: Overactive bladder - Plan: oxybutynin (DITROPAN-XL) 10 MG 24 hr tablet  Gastroesophageal reflux disease, unspecified whether esophagitis present  Chronic pain  syndrome  Herpes zoster without complication - Plan: amitriptyline (ELAVIL) 25 MG tablet  Chronic, stable.  Continue Ditropan XL 10 mg daily. Chronic, stable.  Continue Protonix 40 mg daily. Chronic, not stable.  Restart amitriptyline 25 mg daily.  She will let me know if she needs to increase the dosage back to 50 mg daily.  Counseled on exercise. I will see her in 6 months for physical unless she needs [place].  GYN information sent to her via MyChart. The patient voiced understanding and agreement to the plan.  Jilda Roche [place], DO 09/08/23  4:34 PM

## 2023-09-09 ENCOUNTER — Ambulatory Visit: Payer: 59 | Admitting: Family Medicine

## 2023-09-24 DIAGNOSIS — E559 Vitamin D deficiency, unspecified: Secondary | ICD-10-CM | POA: Diagnosis not present

## 2023-09-24 DIAGNOSIS — N182 Chronic kidney disease, stage 2 (mild): Secondary | ICD-10-CM | POA: Diagnosis not present

## 2023-09-29 DIAGNOSIS — M898X9 Other specified disorders of bone, unspecified site: Secondary | ICD-10-CM | POA: Diagnosis not present

## 2023-09-29 DIAGNOSIS — N182 Chronic kidney disease, stage 2 (mild): Secondary | ICD-10-CM | POA: Diagnosis not present

## 2023-09-29 DIAGNOSIS — G35 Multiple sclerosis: Secondary | ICD-10-CM | POA: Diagnosis not present

## 2023-09-29 DIAGNOSIS — E559 Vitamin D deficiency, unspecified: Secondary | ICD-10-CM | POA: Diagnosis not present

## 2023-09-29 DIAGNOSIS — G894 Chronic pain syndrome: Secondary | ICD-10-CM | POA: Diagnosis not present

## 2023-10-26 ENCOUNTER — Encounter: Payer: Self-pay | Admitting: Family Medicine

## 2023-10-26 ENCOUNTER — Ambulatory Visit (INDEPENDENT_AMBULATORY_CARE_PROVIDER_SITE_OTHER): Admitting: Family Medicine

## 2023-10-26 VITALS — BP 122/84 | HR 73 | Ht 66.0 in | Wt 217.4 lb

## 2023-10-26 DIAGNOSIS — M25531 Pain in right wrist: Secondary | ICD-10-CM | POA: Diagnosis not present

## 2023-10-26 MED ORDER — HYDROCODONE-ACETAMINOPHEN 5-325 MG PO TABS: 1.0000 | ORAL_TABLET | Freq: Four times a day (QID) | ORAL | 0 refills | Status: DC | PRN

## 2023-10-26 NOTE — Progress Notes (Signed)
 Musculoskeletal Exam  Patient: Andrea Briggs DOB: 1975-12-19  DOS: 10/26/2023  SUBJECTIVE:  Chief Complaint:   Chief Complaint  Patient presents with   Hand Pain    Patient presents today for right hand/ wrist pain.    Andrea Briggs is a 48 y.o.  female for evaluation and treatment of right wrist pain.   Onset:  3 weeks ago. No inj or change in activity.  Location: Around her right wrist Character:  aching and burning  Progression of issue:  is unchanged Associated symptoms: Numbness/tingling, swelling, weakness No bruising or redness Treatment: to date has been acetaminophen.   Neurovascular symptoms: no other neuro s/s's  Past Medical History:  Diagnosis Date   Anemia    Anxiety    Chronic kidney disease (CKD)    follows w nephro   Chronic pain    DDD (degenerative disc disease), cervical    DDD (degenerative disc disease), lumbar    Depression    Menorrhagia with irregular cycle 10/17/2020   Migraine    MS (multiple sclerosis) (HCC)    Neuromuscular disorder (HCC)    MS dx 2016   Vertigo     Objective: VITAL SIGNS: BP 122/84   Pulse 73   Ht 5\' 6"  (1.676 m)   Wt 217 lb 6.4 oz (98.6 kg)   LMP 10/24/2023 (Exact Date)   SpO2 98%   BMI 35.09 kg/m  Constitutional: Well formed, well developed. No acute distress. Thorax & Lungs: No accessory muscle use Musculoskeletal: Right wrist.   Soft tissue edema noted over the wrist Tenderness to palpation: yes-over extensor and flexors at the wrist Deformity: no Ecchymosis: no Tests positive: Phalen's, Tinel's Tests negative: none Neurologic: Normal sensory function.  Grip strength adequate Psychiatric: Normal mood. Age appropriate judgment and insight. Alert & oriented x 3.    Assessment:  Right wrist pain - Plan: Wrist splint  Plan: Suspect tendinitis.  There is median nerve involvement but unsure if related to inflammation from the tendinitis.  Stretches/exercises, wrist brace heat, ice, Tylenol.  She has  an appointment with her hand specialist next month.  Norco for breakthrough pain.  Warnings about this verbalized and written down. F/u as originally scheduled. The patient voiced understanding and agreement to the plan.   Jilda Roche [place], DO 10/26/23  12:05 PM

## 2023-10-26 NOTE — Patient Instructions (Addendum)
 Wear the brace at night and during aggravating activities.   Ice/cold pack over area for 10-15 min twice daily.  Heat (pad or rice pillow in microwave) over affected area, 10-15 minutes twice daily.   Do not drink alcohol, do any illicit/street drugs, drive or do anything that requires alertness while on this medicine.   When you void in the middle of the night, get up and move around and try to void again (double-voiding).   Let us know if you need anything.  Wrist and Forearm Exercises Do exercises exactly as told by your health care provider and adjust them as directed. It is normal to feel mild stretching, pulling, tightness, or discomfort as you do these exercises, but you should stop right away if you feel sudden pain or your pain gets worse.   RANGE OF MOTION EXERCISES These exercises warm up your muscles and joints and improve the movement and flexibility of your injured wrist and forearm. These exercises also help to relieve pain, numbness, and tingling. These exercises are done using the muscles in your injured wrist and forearm. Exercise A: Wrist Flexion, Active With your fingers relaxed, bend your wrist forward as far as you can. Hold this position for 30 seconds. Repeat 2 times. Complete this exercise 3 times per week. Exercise B: Wrist Extension, Active With your fingers relaxed, bend your wrist backward as far as you can. Hold this position for 30 seconds. Repeat 2 times. Complete this exercise 3 times per week. Exercise C: Supination, Active  Stand or sit with your arms at your sides. Bend your left / right elbow to an "L" shape (90 degrees). Turn your palm upward until you feel a gentle stretch on the inside of your forearm. Hold this position for 30 seconds. Slowly return your palm to the starting position. Repeat 2 times. Complete this exercise 3 times per week. Exercise D: Pronation, Active  Stand or sit with your arms at your sides. Bend your left / right  elbow to an "L" shape (90 degrees). Turn your palm downward until you feel a gentle stretch on the top of your forearm. Hold this position for 30 seconds. Slowly return your palm to the starting position. Repeat 2 times. Complete this exercise once a day.  STRETCHING EXERCISES These exercises warm up your muscles and joints and improve the movement and flexibility of your injured wrist and forearm. These exercises also help to relieve pain, numbness, and tingling. These exercises are done using your healthy wrist and forearm to help stretch the muscles in your injured wrist and forearm. Exercise E: Wrist Flexion, Passive  Extend your left / right arm in front of you, relax your wrist, and point your fingers downward. Gently push on the back of your hand. Stop when you feel a gentle stretch on the top of your forearm. Hold this position for 30 seconds. Repeat 2 times. Complete this exercise 3 times per week. Exercise F: Wrist Extension, Passive  Extend your left / right arm in front of you and turn your palm upward. Gently pull your palm and fingertips back so your fingers point downward. You should feel a gentle stretch on the palm-side of your forearm. Hold this position for 30 seconds. Repeat 2 times. Complete this exercise 3 times per week. Exercise G: Forearm Rotation, Supination, Passive Sit with your left / right elbow bent to an "L" shape (90 degrees) with your forearm resting on a table. Keeping your upper body and shoulder still, use your  other hand to rotate your forearm palm-up until you feel a gentle to moderate stretch. Hold this position for 30 seconds. Slowly release the stretch and return to the starting position. Repeat 2 times. Complete this exercise 3 times per week. Exercise H: Forearm Rotation, Pronation, Passive Sit with your left / right elbow bent to an "L" shape (90 degrees) with your forearm resting on a table. Keeping your upper body and shoulder still, use your  other hand to rotate your forearm palm-down until you feel a gentle to moderate stretch. Hold this position for 30 seconds. Slowly release the stretch and return to the starting position. Repeat 2 times. Complete this exercise 3 times per week.  STRENGTHENING EXERCISES These exercises build strength and endurance in your wrist and forearm. Endurance is the ability to use your muscles for a long time, even after they get tired. Exercise I: Wrist Flexors  Sit with your left / right forearm supported on a table and your hand resting palm-up over the edge of the table. Your elbow should be bent to an "L" shape (about 90 degrees) and be below the level of your shoulder. Hold a 3-5 lb weight in your left / right hand. Or, hold a rubber exercise band or tube in both hands, keeping your hands at the same level and hip distance apart. There should be a slight tension in the exercise band or tube. Slowly curl your hand up toward your forearm. Hold this position for 3 seconds. Slowly lower your hand back to the starting position. Repeat 2 times. Complete this exercise 3 times per week. Exercise J: Wrist Extensors  Sit with your left / right forearm supported on a table and your hand resting palm-down over the edge of the table. Your elbow should be bent to an "L" shape (about 90 degrees) and be below the level of your shoulder. Hold a 3-5 lb weight in your left / right hand. Or, hold a rubber exercise band or tube in both hands, keeping your hands at the same level and hip distance apart. There should be a slight tension in the exercise band or tube. Slowly curl your hand up toward your forearm. Hold this position for 3 seconds. Slowly lower your hand back to the starting position. Repeat 2 times. Complete this exercise 3 times per week. Exercise K: Forearm Rotation, Supination  Sit with your left / right forearm supported on a table and your hand resting palm-down. Your elbow should be at your side,  bent to an "L" shape (about 90 degrees), and below the level of your shoulder. Keep your wrist stable and in a neutral position throughout the exercise. Gently hold a lightweight hammer with your left / right hand. Without moving your elbow or wrist, slowly rotate your palm upward to a thumbs-up position. Hold this position for 3 seconds. Slowly return your forearm to the starting position. Repeat 2 times. Complete this exercise 3 times per week. Exercise L: Forearm Rotation, Pronation  Sit with your left / right forearm supported on a table and your hand resting palm-up. Your elbow should be at your side, bent to an "L" shape (about 90 degrees), and below the level of your shoulder. Keep your wrist stable. Do not allow it to move backward or forward during the exercise. Gently hold a lightweight hammer with your left / right hand. Without moving your elbow or wrist, slowly rotate your palm and hand upward to a thumbs-up position. Hold this position for 3  seconds. Slowly return your forearm to the starting position. Repeat 2 times. Complete this exercise 3 times per week. Exercise M: Grip Strengthening  Hold one of these items in your left / right hand: play dough, therapy putty, a dense sponge, a stress ball, or a large, rolled sock. Squeeze as hard as you can without increasing pain. Hold this position for 5 seconds. Slowly release your grip. Repeat 2 times. Complete this exercise 3 times per week.  This information is not intended to replace advice given to you by your health care provider. Make sure you discuss any questions you have with your health care provider. Document Released: 05/21/2005 Document Revised: 03/31/2016 Document Reviewed: 04/01/2015 Elsevier Interactive Patient Education  [employer].

## 2023-11-11 DIAGNOSIS — G35 Multiple sclerosis: Secondary | ICD-10-CM | POA: Diagnosis not present

## 2023-11-11 DIAGNOSIS — N182 Chronic kidney disease, stage 2 (mild): Secondary | ICD-10-CM | POA: Diagnosis not present

## 2023-11-11 DIAGNOSIS — G43019 Migraine without aura, intractable, without status migrainosus: Secondary | ICD-10-CM | POA: Diagnosis not present

## 2023-11-11 DIAGNOSIS — G894 Chronic pain syndrome: Secondary | ICD-10-CM | POA: Diagnosis not present

## 2023-11-11 DIAGNOSIS — N179 Acute kidney failure, unspecified: Secondary | ICD-10-CM | POA: Diagnosis not present

## 2023-11-11 DIAGNOSIS — G5602 Carpal tunnel syndrome, left upper limb: Secondary | ICD-10-CM | POA: Diagnosis not present

## 2023-12-04 ENCOUNTER — Encounter: Payer: Self-pay | Admitting: Family

## 2023-12-04 ENCOUNTER — Ambulatory Visit: Admitting: Family

## 2023-12-04 VITALS — BP 102/68 | HR 108 | Temp 98.4°F | Ht 66.0 in | Wt 209.0 lb

## 2023-12-04 DIAGNOSIS — R059 Cough, unspecified: Secondary | ICD-10-CM | POA: Diagnosis not present

## 2023-12-04 DIAGNOSIS — J0100 Acute maxillary sinusitis, unspecified: Secondary | ICD-10-CM

## 2023-12-04 MED ORDER — FLUTICASONE PROPIONATE 50 MCG/ACT NA SUSP: 2.0000 | Freq: Every day | NASAL | 6 refills | Status: DC

## 2023-12-04 MED ORDER — AMOXICILLIN-POT CLAVULANATE 875-125 MG PO TABS: 1.0000 | ORAL_TABLET | Freq: Two times a day (BID) | ORAL | 0 refills | Status: AC

## 2023-12-04 NOTE — Progress Notes (Signed)
 Andrea Briggs is a 48 y.o. female with the following history as recorded in [employer]:  Patient Active Problem List   Diagnosis Date Noted   Gastroesophageal reflux disease 09/08/2023   Overactive bladder 09/08/2023   Chronic cough 01/21/2023   Iron  deficiency anemia due to chronic blood loss 10/17/2020   Menorrhagia with irregular cycle 10/17/2020   Fever and chills 07/30/2020   Weakness of both lower extremities 04/30/2018   Greater trochanteric bursitis of left hip 03/10/2018   Aortic atherosclerosis (HCC) 11/12/2017   Fibromyalgia 08/13/2017   MS (multiple sclerosis) (HCC)    DDD (degenerative disc disease), cervical    DDD (degenerative disc disease), lumbar    Chronic kidney disease (CKD)    Chronic pain    Carpal tunnel syndrome of left wrist 09/16/2013    Current Outpatient Medications  Medication Sig Dispense Refill   acetaminophen  (TYLENOL ) 325 MG tablet Take 2 tablets (650 mg total) by mouth every 6 (six) hours as needed. 36 tablet 0   albuterol  (VENTOLIN  HFA) 108 (90 Base) MCG/ACT inhaler Take 1-2 puffs before bed as needed for cough. 8 g 0   amitriptyline  (ELAVIL ) 25 MG tablet Take 1 tablet (25 mg total) by mouth at bedtime. 90 tablet 2   amoxicillin -clavulanate (AUGMENTIN ) 875-125 MG tablet Take 1 tablet by mouth 2 (two) times daily for 7 days. 14 tablet 0   fluticasone  (FLONASE ) 50 MCG/ACT nasal spray Place 2 sprays into both nostrils daily. 16 g 6   HYDROcodone -acetaminophen  (NORCO/VICODIN) 5-325 MG tablet Take 1 tablet by mouth every 6 (six) hours as needed for moderate pain (pain score 4-6). 12 tablet 0   Multiple Vitamin (MULTIVITAMIN) capsule Take 1 capsule by mouth daily.     OCRELIZUMAB  IV Inject 1 Dose into the vein every 6 (six) months.     ondansetron  (ZOFRAN -ODT) 4 MG disintegrating tablet Take 1 tablet (4 mg total) by mouth every 8 (eight) hours as needed for nausea or vomiting. 90 tablet 0   oxybutynin  (DITROPAN -XL) 10 MG 24 hr tablet Take 1 tablet (10  mg total) by mouth at bedtime. 90 tablet 2   pantoprazole  (PROTONIX ) 40 MG tablet Take 1 tablet (40 mg total) by mouth daily. 90 tablet 1   traZODone (DESYREL) 50 MG tablet Take by mouth at bedtime as needed for sleep. Unsure dose     zonisamide (ZONEGRAN) 100 MG capsule Take 300 mg by mouth daily.     No current facility-administered medications for this visit.    Allergies: Ketorolac , Metoprolol, and Nsaids  Past Medical History:  Diagnosis Date   Anemia    Anxiety    Chronic kidney disease (CKD)    follows w nephro   Chronic pain    DDD (degenerative disc disease), cervical    DDD (degenerative disc disease), lumbar    Depression    Menorrhagia with irregular cycle 10/17/2020   Migraine    MS (multiple sclerosis) (HCC)    Neuromuscular disorder (HCC)    MS dx 2016   Vertigo     Past Surgical History:  Procedure Laterality Date   carpel tunnel     HERNIA REPAIR      Family History  Problem Relation Age of Onset   Cancer Maternal Grandmother        breast cancer   Stroke Maternal Grandfather    Colon cancer Neg Hx    Esophageal cancer Neg Hx    Rectal cancer Neg Hx    Stomach cancer Neg Hx  Social History   Tobacco Use   Smoking status: Never   Smokeless tobacco: Never  Substance Use Topics   Alcohol use: No    Subjective:   1 week history of cough/ congestion; no chest pain or shortness of breath; + sinus pain/ pressure; has been using OTC Dayquil/ Nyquil; concerned about underlying MS;   Objective:  Vitals:   12/04/23 1442  BP: 102/68  Pulse: (!) 108  Temp: 98.4 F (36.9 C)  TempSrc: Oral  SpO2: 100%  Weight: 209 lb (94.8 kg)  Height: 5\' 6"  (1.676 m)    General: Well developed, well nourished, in no acute distress  Skin : Warm and dry.  Head: Normocephalic and atraumatic  Eyes: Sclera and conjunctiva clear; pupils round and reactive to light; extraocular movements intact  Ears: External normal; canals clear; tympanic membranes congested  bilaterally Oropharynx: Pink, supple. No suspicious lesions  Neck: Supple without thyromegaly, adenopathy  Lungs: Respirations unlabored; clear to auscultation bilaterally without wheeze, rales, rhonchi  CVS exam: normal rate and regular rhythm.  Neurologic: Alert and oriented; speech intact; face symmetrical; moves all extremities well; CNII-XII intact without focal deficit   Assessment:  1. Acute maxillary sinusitis, recurrence not specified   2. Cough, unspecified type     Plan:  Rapid COVID, flu and strep all negative; RX for Augmentin  875 mg bid x 7 days; Rx for Flonase  2 sprays/ nostril every day; increase fluids, rest and follow up worse, no better.   No follow-ups on file.  Orders Placed This Encounter  Procedures   POC COVID-19    Previously tested for COVID-19:   No    Resident in a congregate (group) care setting:   No    Employed in healthcare setting:   No    Pregnant:   No   POC Influenza A&B (Binax test)   POCT rapid strep A    Requested Prescriptions   Signed Prescriptions Disp Refills   amoxicillin -clavulanate (AUGMENTIN ) 875-125 MG tablet 14 tablet 0    Sig: Take 1 tablet by mouth 2 (two) times daily for 7 days.   fluticasone  (FLONASE ) 50 MCG/ACT nasal spray 16 g 6    Sig: Place 2 sprays into both nostrils daily.

## 2023-12-07 LAB — POC COVID19 BINAXNOW: SARS Coronavirus 2 Ag: NEGATIVE

## 2023-12-07 LAB — POCT RAPID STREP A (OFFICE): Rapid Strep A Screen: NEGATIVE

## 2023-12-07 LAB — POC INFLUENZA A&B (BINAX/QUICKVUE)
Influenza A, POC: NEGATIVE
Influenza B, POC: NEGATIVE

## 2023-12-31 ENCOUNTER — Ambulatory Visit: Payer: Self-pay

## 2023-12-31 NOTE — Telephone Encounter (Signed)
 FYI Only or Action Required?: Action required by provider  Patient was last seen in primary care on 12/04/2023 by Adra Alanis, FNP. Called Nurse Triage reporting Nasal Congestion and Cough. Symptoms began several weeks ago. Interventions attempted: Augmentin . Symptoms are: gradually improving.  Triage Disposition: See PCP When Office is Open (Within 3 Days)  Patient/caregiver understands and will follow disposition?: No    Copied from CRM [id number]. Topic: Clinical - Medication Question >> Dec 31, 2023  2:05 PM Juleen Oakland F wrote: Reason for CRM:  Patient was seen by Rice Chamorro 12/04/23 and was told to call the office back if she wasn't feeling better. Patient is still having cough and stuffy nose and wants to consider starting the z pack that Lauren recommended. Please call her at [phone] (M) Reason for Disposition  [1] Taking antibiotic > 7 days AND [2] nasal discharge not improved  Answer Assessment - Initial Assessment Questions RN advised pt that typically providers prefer to see a pt before prescribing different medications. Pt had an office visit 5/16 and states she was told to call back if she was still symptomatic and she would be prescribed a Z-pak in that case. Pt was prescribed Augmentin  which she finished on 5/22. Pt reports cough and nasal congestion with green mucus. Pt states her sinus pain has resolved.  1. ANTIBIOTIC: What antibiotic are you taking? How many times a day?     Finished Augmentin   2. ONSET: When was the antibiotic started?     5/16 3. PAIN: How bad is the sinus pain?   (Scale 1-10; mild, moderate or severe)   - MILD (1-3): doesn't interfere with normal activities    - MODERATE (4-7): interferes with normal activities (e.g., work or school) or awakens from sleep   - SEVERE (8-10): excruciating pain and patient unable to do any normal activities        Denies  4. FEVER: Do you have a fever? If Yes, ask: What is it, how was it measured, and when  did it start?      No  5. SYMPTOMS: Are there any other symptoms you're concerned about? If Yes, ask: When did it start?   Cough, worse at night  Mucus is green - coughing and via her nose   Denies N/V  Protocols used: Sinus Infection on Antibiotic Follow-up Call-[facility]

## 2024-01-01 NOTE — Telephone Encounter (Signed)
 Called pt and left a VM advising pt, it has been right at a month since pts OV with Rice Chamorro and we believe it is best to have pt follow up with PCP. Advised pt to give the office a call back to schedule follow up or for any further questions or concerns.

## 2024-01-04 ENCOUNTER — Encounter: Payer: Self-pay | Admitting: Family Medicine

## 2024-01-04 ENCOUNTER — Telehealth (INDEPENDENT_AMBULATORY_CARE_PROVIDER_SITE_OTHER): Admitting: Family Medicine

## 2024-01-04 DIAGNOSIS — J301 Allergic rhinitis due to pollen: Secondary | ICD-10-CM | POA: Diagnosis not present

## 2024-01-04 MED ORDER — PREDNISONE 20 MG PO TABS: 40.0000 mg | ORAL_TABLET | Freq: Every day | ORAL | 0 refills | Status: AC

## 2024-01-04 NOTE — Progress Notes (Signed)
 Chief Complaint  Patient presents with   Sinusitis    Follow UP    Andrea Briggs here for URI complaints. We are interacting via web portal for an electronic face-to-face visit. I verified patient's ID using 2 identifiers. Patient agreed to proceed with visit via this method. Patient is at home, I am at office. Patient and I are present for visit.   Duration: 6.5 weeks  Associated symptoms: sinus congestion, rhinorrhea, and coughing, fatigue Denies: sinus pain, itchy watery eyes, ear pain, ear drainage, sore throat, wheezing, shortness of breath, myalgia, and fevers Treatment to date: Augmentin , Flonase  Sick contacts: Nobody recently  Past Medical History:  Diagnosis Date   Anemia    Anxiety    Chronic kidney disease (CKD)    follows w nephro   Chronic pain    DDD (degenerative disc disease), cervical    DDD (degenerative disc disease), lumbar    Depression    Menorrhagia with irregular cycle 10/17/2020   Migraine    MS (multiple sclerosis) (HCC)    Neuromuscular disorder (HCC)    MS dx 2016   Vertigo     Objective No conversational dyspnea Age appropriate judgment and insight Nml affect and mood  Seasonal allergic rhinitis due to pollen - Plan: predniSONE  (DELTASONE ) 20 MG tablet  5 d pred burst 40 mg/d. Send message in 2-3 d if not sig better. Will consider 7 d of doxy. Continue to push fluids, practice good hand hygiene, cover mouth when coughing. F/u prn. If starting to experience fevers, shaking, or shortness of breath, seek immediate care. Pt voiced understanding and agreement to the plan.  [name], DO 01/04/24 10:06 AM

## 2024-01-18 DIAGNOSIS — G35 Multiple sclerosis: Secondary | ICD-10-CM | POA: Diagnosis not present

## 2024-02-18 DIAGNOSIS — J019 Acute sinusitis, unspecified: Secondary | ICD-10-CM | POA: Diagnosis not present

## 2024-03-14 ENCOUNTER — Ambulatory Visit (INDEPENDENT_AMBULATORY_CARE_PROVIDER_SITE_OTHER): Admitting: Physician Assistant

## 2024-03-14 ENCOUNTER — Encounter: Payer: Self-pay | Admitting: Physician Assistant

## 2024-03-14 ENCOUNTER — Ambulatory Visit: Payer: Self-pay

## 2024-03-14 VITALS — BP 96/80 | HR 98 | Temp 97.9°F | Resp 16 | Ht 66.0 in | Wt 195.0 lb

## 2024-03-14 DIAGNOSIS — H66002 Acute suppurative otitis media without spontaneous rupture of ear drum, left ear: Secondary | ICD-10-CM

## 2024-03-14 MED ORDER — AMOXICILLIN-POT CLAVULANATE 875-125 MG PO TABS: 1.0000 | ORAL_TABLET | Freq: Two times a day (BID) | ORAL | 0 refills | Status: AC

## 2024-03-14 NOTE — Progress Notes (Signed)
 Established patient visit   Patient: Andrea Briggs   DOB: 1976-01-25   48 y.o. Female  MRN: [id number] Visit Date: 03/14/2024  Today's healthcare provider: Manuelita Flatness, PA-C   Chief Complaint  Patient presents with   Ear Pain    Patient reports left ear pain since Saturday    Subjective     Discussed the use of AI scribe software for clinical note transcription with the patient, who gave verbal consent to proceed.  History of Present Illness   Andrea Briggs is a 48 year old female who presents with ear pain and persistent cough.  She experiences ear pain since Saturday, described as a clogged and muffled sensation, with accompanying vertigo. She uses earbuds but is unsure of her relation to her symptoms. No recent travel or antibiotic use since July for sinus problems.  She has a persistent non-productive cough lasting a few months. Initial treatment with antibiotics resolved sinus symptoms but not the cough. Over-the-counter medications have not provided relief. The cough disrupts her sleep, causing significant fatigue     Medications: Outpatient Medications Prior to Visit  Medication Sig   acetaminophen  (TYLENOL ) 325 MG tablet Take 2 tablets (650 mg total) by mouth every 6 (six) hours as needed.   albuterol  (VENTOLIN  HFA) 108 (90 Base) MCG/ACT inhaler Take 1-2 puffs before bed as needed for cough.   amitriptyline  (ELAVIL ) 25 MG tablet Take 1 tablet (25 mg total) by mouth at bedtime.   fluticasone  (FLONASE ) 50 MCG/ACT nasal spray Place 2 sprays into both nostrils daily.   Multiple Vitamin (MULTIVITAMIN) capsule Take 1 capsule by mouth daily.   OCRELIZUMAB  IV Inject 1 Dose into the vein every 6 (six) months.   ondansetron  (ZOFRAN -ODT) 4 MG disintegrating tablet Take 1 tablet (4 mg total) by mouth every 8 (eight) hours as needed for nausea or vomiting.   oxybutynin  (DITROPAN -XL) 10 MG 24 hr tablet Take 1 tablet (10 mg total) by mouth at bedtime.   pantoprazole   (PROTONIX ) 40 MG tablet Take 1 tablet (40 mg total) by mouth daily.   traZODone (DESYREL) 50 MG tablet Take by mouth at bedtime as needed for sleep. Unsure dose   zonisamide (ZONEGRAN) 100 MG capsule Take 300 mg by mouth daily.   No facility-administered medications prior to visit.    Review of Systems  Constitutional:  Negative for fatigue and fever.  HENT:  Positive for ear pain.   Respiratory:  Positive for cough. Negative for shortness of breath.   Cardiovascular:  Negative for chest pain and leg swelling.  Gastrointestinal:  Negative for abdominal pain.  Neurological:  Negative for dizziness and headaches.       Objective    BP 96/80 (BP Location: Right Arm, Patient Position: Sitting, Cuff Size: Normal)   Pulse 98   Temp 97.9 F (36.6 C) (Oral)   Resp 16   Ht 5' 6 (1.676 m)   Wt 195 lb (88.5 kg)   SpO2 99%   BMI 31.47 kg/m    Physical Exam Constitutional:      General: She is awake.     Appearance: She is well-developed.  HENT:     Head: Normocephalic.     Right Ear: Tympanic membrane normal.     Ears:     Comments: Left TM opaque and significantly erythematous. Wax vs exudate? Near TM. Cannot fully eval if ruptured.  Eyes:     Conjunctiva/sclera: Conjunctivae normal.  Cardiovascular:     Rate and  Rhythm: Normal rate and regular rhythm.     Heart sounds: Normal heart sounds.  Pulmonary:     Effort: Pulmonary effort is normal.     Breath sounds: Normal breath sounds. No wheezing, rhonchi or rales.  Skin:    General: Skin is warm.  Neurological:     Mental Status: She is alert and oriented to person, place, and time.  Psychiatric:        Attention and Perception: Attention normal.        Mood and Affect: Mood normal.        Speech: Speech normal.        Behavior: Behavior is cooperative.      No results found for any visits on 03/14/24.  Assessment & Plan    Non-recurrent acute suppurative otitis media of left ear without spontaneous rupture of  tympanic membrane -     Amoxicillin -Pot Clavulanate; Take 1 tablet by mouth 2 (two) times daily for 7 days.  Dispense: 14 tablet; Refill: 0  Advised if symptoms persist beyond abx tx would ref back to ENT Strongly encouraged starting probiotics otc given recent abx use   Return if symptoms worsen or fail to improve.       Manuelita Flatness, PA-C  [facility] [phone] (phone) [phone] (fax)  [facility]

## 2024-03-14 NOTE — Telephone Encounter (Signed)
 Appt scheduled

## 2024-03-14 NOTE — Telephone Encounter (Signed)
 FYI Only or Action Required?: FYI only for provider.  Patient was last seen in primary care on 01/04/2024 by Frann Mabel Mt, DO.  Called Nurse Triage reporting Otalgia.  Symptoms began several days ago.  Interventions attempted: OTC medications: ear relief.  Symptoms are: gradually worsening.  Triage Disposition: See Physician Within 24 Hours  Patient/caregiver understands and will follow disposition?: Yes   Copied from CRM #[id number]. Topic: Clinical - Red Word Triage >> Mar 14, 2024  9:13 AM Robinson DEL wrote: [facility] that prompted transfer to Nurse Triage: Possible ear infection, pain and wet on inside left ear. Reason for Disposition  Earache  (Exceptions: Brief ear pain of lasting less than 60 minutes, or earache occurring during air travel.)  Answer Assessment - Initial Assessment Questions 1. LOCATION: Which ear is involved?     Left ear 2. ONSET: When did the ear pain start?      Saturday  3. SEVERITY: How bad is the pain?  (Scale 1-10; mild, moderate or severe)     7/10 4. URI SYMPTOMS: Do you have a runny nose or cough?     Mild cough 5. FEVER: Do you have a fever? If Yes, ask: What is your temperature, how was it measured, and when did it start?      Denies 6. CAUSE: Have you been swimming recently?, How often do you use Q-TIPS?, Have you had any recent air travel or scuba diving?     denies 7. OTHER SYMPTOMS: Do you have any other symptoms? (e.g., decreased hearing, dizziness, headache, stiff neck, vomiting)     Occasional dizziness , clear drainage  Additional info:  Ear pain relief  Protocols used: [place]

## 2024-03-16 ENCOUNTER — Encounter: Payer: Self-pay | Admitting: Student

## 2024-03-16 ENCOUNTER — Ambulatory Visit (INDEPENDENT_AMBULATORY_CARE_PROVIDER_SITE_OTHER): Admitting: Student

## 2024-03-16 ENCOUNTER — Ambulatory Visit: Payer: Self-pay | Admitting: *Deleted

## 2024-03-16 VITALS — BP 120/68 | HR 91 | Temp 98.3°F | Resp 12 | Ht 66.0 in | Wt 195.0 lb

## 2024-03-16 DIAGNOSIS — H6692 Otitis media, unspecified, left ear: Secondary | ICD-10-CM | POA: Diagnosis not present

## 2024-03-16 MED ORDER — TRAMADOL HCL 50 MG PO TABS: 50.0000 mg | ORAL_TABLET | Freq: Four times a day (QID) | ORAL | 0 refills | Status: AC | PRN

## 2024-03-16 NOTE — Progress Notes (Signed)
 Acute Office Visit  Subjective:     Patient ID: Andrea Briggs, female    DOB: [birth year], 48 y.o.   MRN: [id number]  Chief Complaint  Patient presents with   left ear not doing better    HPI Patient is in today for acute ear pain. Patient has history of sinus pain, and recurrent sinusitis/ OM.  She follows ENT- Dr. Kathrene.  Patient was recently evaluated and treated in the office for the same issue on 03/14/2024 and started on Augmentin . She has been taking it for the past 2 days but reports no improvement in symptoms. Pain remains 8/10 and she is experiencing difficulty sleeping due to intense ear pain.  In addition patient complains of dizziness with current ear symptoms. Denies hearing loss. Denies trauma.   Patient denies fever, chills, SOB, CP, palpitations, dyspnea, edema, HA, vision changes, N/V/D, abdominal pain, urinary symptoms, rash, weight changes, and recent illness or hospitalizations.    ROS  See HPI    Objective:    BP 120/68 (BP Location: Left Arm, Patient Position: Sitting, Cuff Size: Normal)   Pulse 91   Temp 98.3 F (36.8 C) (Oral)   Resp 12   Ht 5' 6 (1.676 m)   Wt 195 lb (88.5 kg)   SpO2 98%   BMI 31.47 kg/m    Physical Exam  Physical Exam Constitutional:      General: She is awake.     Appearance: She is well-developed.  HENT:     Head: Normocephalic.     Right Ear: Tympanic membrane normal. External ear canal WNL    Ears:     Comments: Left TM - erythema, difficult to visualize - possible small rupture Left TM, Ear canal WNL    Conjunctiva/sclera: Conjunctivae normal.   Cardiovascular:     Rate and Rhythm: Normal rate and regular rhythm.     Heart sounds: Normal heart sounds.  Pulmonary:     Effort: Pulmonary effort is normal.     Breath sounds: Normal breath sounds. No wheezing, rhonchi or rales.  Skin:    General: Skin is warm.  Neurological:     Mental Status: She is alert and oriented to person, place, and time.   Psychiatric:        Attention and Perception: Attention normal.        Mood and Affect: Mood normal.        Speech: Speech normal.        Behavior: Behavior is cooperative.     No results found for any visits on 03/16/24.      Assessment & Plan:   Problem List Items Addressed This Visit   None Visit Diagnoses       Left otitis media, unspecified otitis media type    -  Primary   Relevant Medications   traMADol  (ULTRAM ) 50 MG tablet   Other Relevant Orders   Ambulatory referral to ENT     Rx-Continue Augmentin  abx PO. Tramadol  prn as needed for pain relief. Concern for cholesteatoma and/or ruptured left TM. Pt has ENT appointment end of next week but have sent urgent ENT referral to try to get an earlier appointment with ENT for further evaluation.  Advise use of  Tylenol  as needed for symptom relief, may use NSAIDS OTC sparingly and discussed with patient.   Meds ordered this encounter  Medications   traMADol  (ULTRAM ) 50 MG tablet    Sig: Take 1 tablet (50 mg total) by mouth every 6 (  six) hours as needed for up to 5 days for severe pain (pain score 7-10).    Dispense:  15 tablet    Refill:  0    Supervising Provider:   DOMENICA BLACKBIRD A [[id number]]       No follow-ups on file.  Christophe Rising L Taran Haynesworth, NP

## 2024-03-16 NOTE — Telephone Encounter (Signed)
 FYI Only or Action Required?: FYI only for provider.  Patient was last seen in primary care on 01/04/2024 by Frann Mabel Mt, DO.  Called Nurse Triage reporting Otalgia.  Symptoms began several days ago.  Interventions attempted: Prescription medications: antibiotic.  Symptoms are: gradually worsening.  Triage Disposition: Call PCP Within 24 Hours  Patient/caregiver understands and will follow disposition?: Yes     Reason for Disposition  [1] Taking antibiotic > 72 hours (3 days) AND [2] symptoms (other than fever) not improved  Answer Assessment - Initial Assessment Questions 1. INFECTION: What infection is the antibiotic being given for?   Ear pain- left 2. ANTIBIOTIC: What antibiotic are you taking How many times per day?       Amox-clav 875-125 mg 3. DURATION: When was the antibiotic started?     Monday 4. MAIN CONCERN OR SYMPTOM:  What is your main concern right now?     Pain in the ear 5. BETTER-SAME-WORSE: Are you getting better, staying the same, or getting worse compared to when you first started the antibiotics? If getting worse, ask: In what way?      Not better- worse 6. FEVER: Do you have a fever? If Yes, ask: What is your temperature, how was it measured, and when did it start?     Unable to check 7. SYMPTOMS: Are there any other symptoms you're concerned about? If Yes, ask: When did it start?     Vertigo- dizzy, feels sick 8. FOLLOW-UP APPOINTMENT: Do you have a follow-up appointment with your doctor?     no  Protocols used: Infection on Antibiotic Follow-up Call-A-[facility]   Copied from CRM #[id number]. Topic: Clinical - Red Word Triage >> Mar 16, 2024 11:47 AM Berneda FALCON wrote: Red Word that prompted transfer to Nurse Triage: Patient was seen on Monday ear infection on left ear and was prescribed antibiotics but the pain is still there and not getting any better. Is there anything else we can do for her or something else she can take?    Patient then described she is feeling vertigo when standing up, dizzy and unbalanced.   This has been ongoing since Saturday

## 2024-03-23 DIAGNOSIS — K219 Gastro-esophageal reflux disease without esophagitis: Secondary | ICD-10-CM | POA: Diagnosis not present

## 2024-03-23 DIAGNOSIS — R051 Acute cough: Secondary | ICD-10-CM | POA: Diagnosis not present

## 2024-03-23 DIAGNOSIS — H6692 Otitis media, unspecified, left ear: Secondary | ICD-10-CM | POA: Diagnosis not present

## 2024-04-13 DIAGNOSIS — H9012 Conductive hearing loss, unilateral, left ear, with unrestricted hearing on the contralateral side: Secondary | ICD-10-CM | POA: Diagnosis not present

## 2024-04-13 DIAGNOSIS — R053 Chronic cough: Secondary | ICD-10-CM | POA: Diagnosis not present

## 2024-04-13 DIAGNOSIS — H938X2 Other specified disorders of left ear: Secondary | ICD-10-CM | POA: Diagnosis not present

## 2024-04-27 DIAGNOSIS — H52203 Unspecified astigmatism, bilateral: Secondary | ICD-10-CM | POA: Diagnosis not present

## 2024-04-27 DIAGNOSIS — H5203 Hypermetropia, bilateral: Secondary | ICD-10-CM | POA: Diagnosis not present

## 2024-04-27 DIAGNOSIS — H2513 Age-related nuclear cataract, bilateral: Secondary | ICD-10-CM | POA: Diagnosis not present

## 2024-04-27 DIAGNOSIS — H524 Presbyopia: Secondary | ICD-10-CM | POA: Diagnosis not present

## 2024-05-02 NOTE — Progress Notes (Signed)
 Andrea Briggs                                          MRN: [id number]   05/02/2024   The [facility] Quality Team Specialist reviewed this patient medical record for the purposes of chart review for care gap closure. The following were reviewed: chart review for care gap closure-breast cancer screening.    [facility] Quality Team

## 2024-05-12 DIAGNOSIS — G35A Relapsing-remitting multiple sclerosis: Secondary | ICD-10-CM | POA: Diagnosis not present

## 2024-05-18 DIAGNOSIS — R053 Chronic cough: Secondary | ICD-10-CM | POA: Diagnosis not present

## 2024-05-18 DIAGNOSIS — H6692 Otitis media, unspecified, left ear: Secondary | ICD-10-CM | POA: Diagnosis not present

## 2024-06-04 ENCOUNTER — Other Ambulatory Visit: Payer: Self-pay

## 2024-06-04 ENCOUNTER — Emergency Department (HOSPITAL_BASED_OUTPATIENT_CLINIC_OR_DEPARTMENT_OTHER)
Admission: EM | Admit: 2024-06-04 | Discharge: 2024-06-04 | Disposition: A | Discharge status: Home / Self Care | Attending: Emergency Medicine | Admitting: Emergency Medicine

## 2024-06-04 DIAGNOSIS — G43809 Other migraine, not intractable, without status migrainosus: Secondary | ICD-10-CM | POA: Diagnosis not present

## 2024-06-04 DIAGNOSIS — H9202 Otalgia, left ear: Secondary | ICD-10-CM | POA: Diagnosis not present

## 2024-06-04 DIAGNOSIS — R519 Headache, unspecified: Secondary | ICD-10-CM | POA: Diagnosis present

## 2024-06-04 MED ORDER — KETOROLAC TROMETHAMINE 30 MG/ML IJ SOLN
15.0000 mg | Freq: Once | INTRAMUSCULAR | Status: AC
  Administered 2024-06-04: 15 mg via INTRAVENOUS
  Filled 2024-06-04: qty 1

## 2024-06-04 MED ORDER — SODIUM CHLORIDE 0.9 % IV BOLUS
1000.0000 mL | Freq: Once | INTRAVENOUS | Status: AC
  Administered 2024-06-04: 1000 mL via INTRAVENOUS

## 2024-06-04 MED ORDER — PROCHLORPERAZINE EDISYLATE 10 MG/2ML IJ SOLN
10.0000 mg | Freq: Once | INTRAMUSCULAR | Status: AC
  Administered 2024-06-04: 10 mg via INTRAVENOUS
  Filled 2024-06-04: qty 2

## 2024-06-04 NOTE — ED Triage Notes (Signed)
 PT states middle ear infection, Seen for same and given Rx. States meds making her sick and not helping with pain. Headache and unable to eat.

## 2024-06-04 NOTE — ED Provider Notes (Signed)
 [facility]  Provider Note  CSN: [id number] Arrival date & time: 06/04/24 9681  History Chief Complaint  Patient presents with   Otalgia   Headache    Andrea Briggs is a 48 y.o. female with history of MS, migraines, recent issues with L otalgia/OM reports she was seen by ENT on 10/29, given Rx for Augmentin  and Ciprodex with some initial improvement in ear pain but returned a few days after finishing Abx. She went to UC and was given Norco, Ciprodex and clarithromycin  which made her feel sick and so she only took one dose. She continues to have left ear pain and muffled hearing. She is scheduled for recheck at ENT on 11/19. Tonight her more pressing concern is a headache consistent with prior migraines. She reports feeling nauseated for several days with poor PO food intake, but maintaining adequate hydration.    Home Medications Prior to Admission medications   Medication Sig Start Date End Date Taking? Authorizing Provider  acetaminophen  (TYLENOL ) 325 MG tablet Take 2 tablets (650 mg total) by mouth every 6 (six) hours as needed. 01/29/22   Elnor Jayson LABOR, DO  albuterol  (VENTOLIN  HFA) 108 (90 Base) MCG/ACT inhaler Take 1-2 puffs before bed as needed for cough. 01/06/23   Frann Mabel Mt, DO  amitriptyline  (ELAVIL ) 25 MG tablet Take 1 tablet (25 mg total) by mouth at bedtime. 09/08/23   Frann Mabel Mt, DO  etonogestrel (NEXPLANON) 68 MG IMPL implant 1 each by Subdermal route once.    [provider]  fluticasone  (FLONASE ) 50 MCG/ACT nasal spray Place 2 sprays into both nostrils daily. 12/04/23   Jason Leita Repine, FNP  Multiple Vitamin (MULTIVITAMIN) capsule Take 1 capsule by mouth daily.    [provider]  OCRELIZUMAB  IV Inject 1 Dose into the vein every 6 (six) months.    [provider]  ondansetron  (ZOFRAN -ODT) 4 MG disintegrating tablet Take 1 tablet (4 mg total) by mouth every 8 (eight)  hours as needed for nausea or vomiting. 09/08/23   Frann, Mabel Mt, DO  oxybutynin  (DITROPAN -XL) 10 MG 24 hr tablet Take 1 tablet (10 mg total) by mouth at bedtime. 09/08/23   Frann Mabel Mt, DO  pantoprazole  (PROTONIX ) 40 MG tablet Take 1 tablet (40 mg total) by mouth daily. 09/08/23   Frann Mabel Mt, DO  traZODone (DESYREL) 50 MG tablet Take by mouth at bedtime as needed for sleep. Unsure dose    [provider]  zonisamide (ZONEGRAN) 100 MG capsule Take 300 mg by mouth daily.    [provider]     Allergies    Ketorolac , Metoprolol, Nsaids, and Imitrex [sumatriptan]   Review of Systems   Review of Systems Please see HPI for pertinent positives and negatives  Physical Exam BP 128/75 (BP Location: Left Arm)   Pulse 95   Temp 99.1 F (37.3 C) (Oral)   Resp 20   Ht 5' 6 (1.676 m)   Wt 85.3 kg   SpO2 95%   BMI 30.34 kg/m   Physical Exam Vitals and nursing note reviewed.  Constitutional:      Appearance: Normal appearance.  HENT:     Head: Normocephalic and atraumatic.     Ears:     Comments: L TM is partially obscured by what appears to be scar tissue, anatomy is distorted, but visualized portion of the TM is pink without effusion or bulging    Nose: Nose normal.  Mouth/Throat:     Mouth: Mucous membranes are moist.  Eyes:     Extraocular Movements: Extraocular movements intact.     Conjunctiva/sclera: Conjunctivae normal.  Cardiovascular:     Rate and Rhythm: Normal rate.  Pulmonary:     Effort: Pulmonary effort is normal.     Breath sounds: Normal breath sounds.  Abdominal:     General: Abdomen is flat.     Palpations: Abdomen is soft.     Tenderness: There is no abdominal tenderness.  Musculoskeletal:        General: No swelling. Normal range of motion.     Cervical back: Neck supple.  Skin:    General: Skin is warm and dry.  Neurological:     General: No focal deficit present.     Mental Status: She is alert.   Psychiatric:        Mood and Affect: Mood normal.     ED Results / Procedures / Treatments   EKG None  Procedures Procedures  Medications Ordered in the ED Medications  ketorolac  (TORADOL ) 30 MG/ML injection 15 mg (15 mg Intravenous Given 06/04/24 0356)  prochlorperazine (COMPAZINE) injection 10 mg (10 mg Intravenous Given 06/04/24 0400)  sodium chloride  0.9 % bolus 1,000 mL (1,000 mLs Intravenous New Bag/Given 06/04/24 0356)    Initial Impression and Plan  Patient here for otalgia in setting of recent OM, does not appear to have active infection tonight. Recommend she discuss with ENT at scheduled follow up in a few days. She is also having migrainous headache, will give a migraine cocktail. Intolerance to NSAIDs/Ketorolac  is based on prior renal insufficiency but recent labs showed normal Cr.   ED Course   Clinical Course as of 06/04/24 0431  Sat Jun 04, 2024  0430 Patient reports symptoms improved and she is ready to go home. Recommend she continue with ENT management of her otalgia. PCP follow up, RTED for any other concerns.   [CS]    Clinical Course User Index [CS] Roselyn Carlin NOVAK, MD     MDM Rules/Calculators/A&P Medical Decision Making Problems Addressed: Left ear pain: acute illness or injury Other migraine without status migrainosus, not intractable: acute illness or injury  Risk Prescription drug management.     Final Clinical Impression(s) / ED Diagnoses Final diagnoses:  Left ear pain  Other migraine without status migrainosus, not intractable    Rx / DC Orders ED Discharge Orders     None        Roselyn Carlin NOVAK, MD 06/04/24 [phone]

## 2024-06-09 ENCOUNTER — Other Ambulatory Visit: Payer: Self-pay

## 2024-06-10 MED ORDER — ONDANSETRON 4 MG PO TBDP: 4.0000 mg | ORAL_TABLET | Freq: Three times a day (TID) | ORAL | 0 refills | Status: AC | PRN

## 2024-08-02 ENCOUNTER — Ambulatory Visit: Payer: Self-pay | Admitting: Family Medicine

## 2024-08-02 ENCOUNTER — Encounter: Payer: Self-pay | Admitting: Family Medicine

## 2024-08-02 ENCOUNTER — Ambulatory Visit (INDEPENDENT_AMBULATORY_CARE_PROVIDER_SITE_OTHER): Admitting: Family Medicine

## 2024-08-02 VITALS — BP 126/80 | HR 110 | Temp 98.0°F | Resp 16 | Ht 66.0 in | Wt 185.0 lb

## 2024-08-02 DIAGNOSIS — R630 Anorexia: Secondary | ICD-10-CM | POA: Diagnosis not present

## 2024-08-02 DIAGNOSIS — J069 Acute upper respiratory infection, unspecified: Secondary | ICD-10-CM

## 2024-08-02 DIAGNOSIS — R053 Chronic cough: Secondary | ICD-10-CM | POA: Diagnosis not present

## 2024-08-02 DIAGNOSIS — R000 Tachycardia, unspecified: Secondary | ICD-10-CM | POA: Diagnosis not present

## 2024-08-02 LAB — COMPREHENSIVE METABOLIC PANEL WITH GFR
ALT: 17 U/L (ref 3–35)
AST: 15 U/L (ref 5–37)
Albumin: 4.3 g/dL (ref 3.5–5.2)
Alkaline Phosphatase: 82 U/L (ref 39–117)
BUN: 19 mg/dL (ref 6–23)
CO2: 24 meq/L (ref 19–32)
Calcium: 9.2 mg/dL (ref 8.4–10.5)
Chloride: 106 meq/L (ref 96–112)
Creatinine, Ser: 1.03 mg/dL (ref 0.40–1.20)
GFR: 63.84 mL/min
Glucose, Bld: 83 mg/dL (ref 70–99)
Potassium: 4.3 meq/L (ref 3.5–5.1)
Sodium: 138 meq/L (ref 135–145)
Total Bilirubin: 0.7 mg/dL (ref 0.2–1.2)
Total Protein: 6.5 g/dL (ref 6.0–8.3)

## 2024-08-02 LAB — CBC
HCT: 33.7 % — ABNORMAL LOW (ref 36.0–46.0)
Hemoglobin: 10.6 g/dL — ABNORMAL LOW (ref 12.0–15.0)
MCHC: 31.5 g/dL (ref 30.0–36.0)
MCV: 66.1 fl — ABNORMAL LOW (ref 78.0–100.0)
Platelets: 373 K/uL (ref 150.0–400.0)
RBC: 5.1 Mil/uL (ref 3.87–5.11)
RDW: 21.7 % — ABNORMAL HIGH (ref 11.5–15.5)
WBC: 4.7 K/uL (ref 4.0–10.5)

## 2024-08-02 LAB — TSH: TSH: 4.46 u[IU]/mL (ref 0.35–5.50)

## 2024-08-02 MED ORDER — MIRTAZAPINE 7.5 MG PO TABS: 7.5000 mg | ORAL_TABLET | Freq: Every day | ORAL | 1 refills | Status: AC

## 2024-08-02 MED ORDER — BENZONATATE 200 MG PO CAPS: 200.0000 mg | ORAL_CAPSULE | Freq: Two times a day (BID) | ORAL | 0 refills | Status: DC | PRN

## 2024-08-02 MED ORDER — OFLOXACIN 0.3 % OT SOLN: 5.0000 [drp] | Freq: Two times a day (BID) | OTIC | 0 refills | Status: AC

## 2024-08-02 NOTE — Progress Notes (Signed)
 Chief Complaint  Patient presents with   Cough    Cough     Andrea Briggs here for URI complaints.  Duration: 3 days  Associated symptoms: sinus congestion, rhinorrhea, and coughing Denies: sinus pain, itchy watery eyes, sore throat, wheezing, shortness of breath, myalgia, and fevers Treatment to date: None Sick contacts: No  Patient has a 2-year history of a dry cough.  This is different from what she is experiencing as above. Treatment to date include inhalers, Zyrtec, steroid nasal sprays.  No association with meals or position.  Coughing is worse during the day rather than the nighttime.  No breathing issues.  No history of heart failure, swelling, association with meals/position.  Patient has a history of weight loss and poor oral intake.  Appetite has decreased.  This has happened historically when her health gets poor.  She maintains her mood is stable.  She is interested in going on a medicine to help with this.  She is up-to-date with her cancer screening.  She does not have any blood in her urine/stool, tarry stools, shortness of breath, or nodularity.  Past Medical History:  Diagnosis Date   Anemia    Anxiety    Chronic kidney disease (CKD)    follows w nephro   Chronic pain    DDD (degenerative disc disease), cervical    DDD (degenerative disc disease), lumbar    Depression    Menorrhagia with irregular cycle 10/17/2020   Migraine    MS (multiple sclerosis)    Neuromuscular disorder (HCC)    MS dx 2016   Vertigo     Objective BP 126/80 (BP Location: Left Arm, Patient Position: Sitting)   Pulse (!) 110   Temp 98 F (36.7 C) (Oral)   Resp 16   Ht 5' 6 (1.676 m)   Wt 185 lb (83.9 kg)   SpO2 96%   BMI 29.86 kg/m  General: Awake, alert, appears stated age HEENT: AT, [place], ears patent b/l and TM's neg, nares patent w/o discharge, pharynx pink and without exudates, MMM, no sinus ttp Neck: No masses or asymmetry Heart: Reg rhythm, tachycardic Lungs: CTAB, no  accessory muscle use Psych: Age appropriate judgment and insight, normal mood and affect  Chronic cough - Plan: Ambulatory referral to Pulmonology  URI with cough and congestion - Plan: benzonatate  (TESSALON ) 200 MG capsule  Poor appetite - Plan: mirtazapine  (REMERON ) 7.5 MG tablet  Tachycardia - Plan: CBC, Comprehensive metabolic panel with GFR, TSH  Chronic issue that is not controlled.  Refer to pulmonology for their opinion.   Tessalon  Perles as needed.  Let me know if she is having issues by the end of the week.  Continue to push fluids, practice good hand hygiene, cover mouth when coughing.  Will send in ofloxacin  drops for her ear drainage.  She has been seeing various ENTs for this situation and has a referral to a subspecialist in April of this year.  Labs as below.  Trial Remeron  7.5 mg nightly.  I think this could help with her mood/anxiousness associated with health issues as well.  Follow-up in 1 month for this. Could be because she is sick but will check the above labs. Pt voiced understanding and agreement to the plan.  I spent 35 minutes with the patient discussing the above plans in addition to reviewing her chart and the same day of the visit.  Mabel Mt Barling, DO 08/02/2024 1:01 PM

## 2024-08-02 NOTE — Patient Instructions (Addendum)
If you do not hear anything about your referral in the next 1-2 weeks, call our office and ask for an update.  Give us 2-3 business days to get the results of your labs back.   Let us know if you need anything. 

## 2024-08-03 ENCOUNTER — Other Ambulatory Visit: Payer: Self-pay

## 2024-08-03 ENCOUNTER — Other Ambulatory Visit: Payer: Self-pay | Admitting: *Deleted

## 2024-08-03 ENCOUNTER — Telehealth: Payer: Self-pay | Admitting: Family Medicine

## 2024-08-03 ENCOUNTER — Ambulatory Visit: Payer: Self-pay | Admitting: Family Medicine

## 2024-08-03 ENCOUNTER — Ambulatory Visit (INDEPENDENT_AMBULATORY_CARE_PROVIDER_SITE_OTHER)

## 2024-08-03 DIAGNOSIS — D649 Anemia, unspecified: Secondary | ICD-10-CM

## 2024-08-03 DIAGNOSIS — E611 Iron deficiency: Secondary | ICD-10-CM

## 2024-08-03 LAB — IBC + FERRITIN
Ferritin: 14.6 ng/mL (ref 10.0–291.0)
Iron: 28 ug/dL — ABNORMAL LOW (ref 42–145)
Saturation Ratios: 7.9 % — ABNORMAL LOW (ref 20.0–50.0)
TIBC: 355.6 ug/dL (ref 250.0–450.0)
Transferrin: 254 mg/dL (ref 212.0–360.0)

## 2024-08-03 MED ORDER — HYDROCODONE BIT-HOMATROP MBR 5-1.5 MG/5ML PO SOLN: 5.0000 mL | Freq: Three times a day (TID) | ORAL | 0 refills | Status: DC | PRN

## 2024-08-03 NOTE — Telephone Encounter (Signed)
 Copied from CRM [id number]. Topic: Clinical - Prescription Issue >> Aug 03, 2024  3:25 PM Drema MATSU wrote: Reason for CRM: Patient stated that benzonatate  (TESSALON ) 200 MG capsule is $27.00 and she does not have that. She is requesting another medication.

## 2024-08-03 NOTE — Telephone Encounter (Signed)
 Alt sent. Plz let her know it may make her drowsy. Thx.

## 2024-08-04 ENCOUNTER — Telehealth: Payer: Self-pay | Admitting: Family Medicine

## 2024-08-04 ENCOUNTER — Other Ambulatory Visit: Payer: Self-pay | Admitting: Family Medicine

## 2024-08-04 MED ORDER — ACETAMINOPHEN-CODEINE 300-30 MG PO TABS: 1.0000 | ORAL_TABLET | Freq: Three times a day (TID) | ORAL | 0 refills | Status: AC | PRN

## 2024-08-04 NOTE — Telephone Encounter (Signed)
 Do we have any idea what rx cough medication would be covered by her ins? I suppose we could just do some Tylenol  #3's for the codeine .

## 2024-08-04 NOTE — Telephone Encounter (Signed)
 Pt.notified

## 2024-08-04 NOTE — Telephone Encounter (Signed)
 Copied from CRM #[id number]. Topic: Clinical - Prescription Issue >> Aug 04, 2024  2:12 PM Rea C wrote: Reason for CRM: HYDROcodone  bit-homatropine (HYCODAN) 5-1.5 MG/5ML syrup [[id number]]  Pharmacy told patient that the prescription Dr. Frann called in yesterday is not covered under her insurance. Patient is asking if Dr. Frann can do pill form to see if they can be covered.   [id number] (M)   Pharmacy in chart

## 2024-08-04 NOTE — Telephone Encounter (Signed)
 Sent. I will just pay for her downstairs if still not covered.

## 2024-08-04 NOTE — Telephone Encounter (Signed)
 Called pharmacy and medication cost $0.15.  Called and notified patient and she is able to get that.

## 2024-08-08 ENCOUNTER — Telehealth: Payer: Self-pay | Admitting: Family Medicine

## 2024-08-08 MED ORDER — CEPHALEXIN 500 MG PO CAPS: 500.0000 mg | ORAL_CAPSULE | Freq: Three times a day (TID) | ORAL | 0 refills | Status: AC

## 2024-08-08 NOTE — Telephone Encounter (Signed)
 Sent pt message letting her know medication has been sent to for UTI.

## 2024-08-08 NOTE — Telephone Encounter (Signed)
 Copied from CRM [id number]. Topic: General - Other >> Aug 08, 2024  9:49 AM Zebedee SAUNDERS wrote: Reason for CRM: Pt would like something sent in to pharmacy for UTI, pt has MS.  [id number] - [address] [place] [address] Phone: [phone] Fax: [phone]

## 2024-08-08 NOTE — Telephone Encounter (Signed)
 Sent!

## 2024-08-10 ENCOUNTER — Other Ambulatory Visit: Payer: Self-pay

## 2024-08-10 MED ORDER — FLUTICASONE PROPIONATE 50 MCG/ACT NA SUSP: 2.0000 | Freq: Every day | NASAL | 6 refills | Status: AC

## 2024-08-25 ENCOUNTER — Ambulatory Visit: Payer: 59

## 2024-08-25 VITALS — Ht 66.0 in | Wt 185.0 lb

## 2024-08-25 DIAGNOSIS — Z0000 Encounter for general adult medical examination without abnormal findings: Secondary | ICD-10-CM | POA: Diagnosis not present

## 2024-08-25 NOTE — Patient Instructions (Addendum)
 [name],  Thank you for taking the time for your Medicare Wellness Visit. I appreciate your continued commitment to your health goals. Please review the care plan we discussed, and feel free to reach out if I can assist you further.  Please note that Annual Wellness Visits do not include a physical exam. Some assessments may be limited, especially if the visit was conducted virtually. If needed, we may recommend an in-person follow-up with your provider.  Ongoing Care Seeing your primary care provider every 3 to 6 months helps us  monitor your health and provide consistent, personalized care.   Referrals If a referral was made during today's visit and you haven't received any updates within two weeks, please contact the referred provider directly to check on the status.  Recommended Screenings:  Health Maintenance  Topic Date Due   Hepatitis C Screening  Never done   Hepatitis B Vaccine (1 of 3 - 19+ 3-dose series) Never done   Pap with HPV screening  Never done   Breast Cancer Screening  Never done   Flu Shot  10/18/2024*   Medicare Annual Wellness Visit  08/25/2025   DTaP/Tdap/Td vaccine (2 - Td or Tdap) 08/11/2029   Colon Cancer Screening  05/07/2030   HPV Vaccine (No Doses Required) Completed   HIV Screening  Completed   Pneumococcal Vaccine  Aged Out   Meningitis B Vaccine  Aged Out   COVID-19 Vaccine  Discontinued  *Topic was postponed. The date shown is not the original due date.       08/25/2024    4:06 PM  Advanced Directives  Does Patient Have a Medical Advance Directive? No  Would patient like information on creating a medical advance directive? No - Patient declined    Vision: Annual vision screenings are recommended for early detection of glaucoma, cataracts, and diabetic retinopathy. These exams can also reveal signs of chronic conditions such as diabetes and high blood pressure.  Dental: Annual dental screenings help detect early signs of oral cancer, gum  disease, and other conditions linked to overall health, including heart disease and diabetes.  Please see the attached documents for additional preventive care recommendations.

## 2024-08-25 NOTE — Progress Notes (Signed)
 "  Chief Complaint  Patient presents with   Medicare Wellness     Subjective:   Andrea Briggs is a 49 y.o. female who presents for a Medicare Annual Wellness Visit.  Visit info / Clinical Intake: Medicare Wellness Visit Type:: Subsequent Annual Wellness Visit Persons participating in visit and providing information:: patient Medicare Wellness Visit Mode:: Video Since this visit was completed virtually, some vitals may be partially provided or unavailable. Missing vitals are due to the limitations of the virtual format.: Documented vitals are patient reported If Telephone or Video please confirm:: I connected with patient using audio/video enable telemedicine. I verified patient identity with two identifiers, discussed telehealth limitations, and patient agreed to proceed. Patient Location:: Home Provider Location:: Office Interpreter Needed?: No Pre-visit prep was completed: yes AWV questionnaire completed by patient prior to visit?: yes Date:: 08/24/24 Living arrangements:: lives with spouse/significant other Patient's Overall Health Status Rating: (!) fair Typical amount of pain: some Does pain affect daily life?: (!) yes (Followed by medical attention) Are you currently prescribed opioids?: no  Dietary Habits and Nutritional Risks How many meals a day?: 3 Eats fruit and vegetables daily?: yes Most meals are obtained by: preparing own meals In the last 2 weeks, have you had any of the following?: none Diabetic:: no  Functional Status Activities of Daily Living (to include ambulation/medication): Independent Ambulation: Independent with device- listed below Home Assistive Devices/Equipment: Eyeglasses Medication Administration: Independent Home Management (perform basic housework or laundry): Independent Manage your own finances?: yes Primary transportation is: driving Concerns about vision?: no *vision screening is required for WTM* Concerns about hearing?: no  Fall  Screening Falls in the past year?: 1 Number of falls in past year: 0 Was there an injury with Fall?: 0 Fall Risk Category Calculator: 1 Patient Fall Risk Level: Low Fall Risk  Fall Risk Patient at Risk for Falls Due to: Impaired balance/gait Fall risk Follow up: Falls evaluation completed  Home and Transportation Safety: All rugs have non-skid backing?: N/A, no rugs All stairs or steps have railings?: yes Grab bars in the bathtub or shower?: (!) no Have non-skid surface in bathtub or shower?: yes Good home lighting?: yes Regular seat belt use?: yes Hospital stays in the last year:: no  Cognitive Assessment Difficulty concentrating, remembering, or making decisions? : no Will 6CIT or Mini Cog be Completed: yes What year is it?: 0 points What month is it?: 0 points Give patient an address phrase to remember (5 components): [address] [place]  About what time is it?: 0 points Count backwards from 20 to 1: 0 points Say the months of the year in reverse: 0 points Repeat the address phrase from earlier: 0 points 6 CIT Score: 0 points  Advance Directives (For Healthcare) Does Patient Have a Medical Advance Directive?: No Would patient like information on creating a medical advance directive?: No - Patient declined  Reviewed/Updated  Reviewed/Updated: Reviewed All (Medical, Surgical, Family, Medications, Allergies, Care Teams, Patient Goals)    Allergies (verified) Ketorolac , Metoprolol, Nsaids, and Imitrex [sumatriptan]   Current Medications (verified) Outpatient Encounter Medications as of 08/25/2024  Medication Sig   acetaminophen  (TYLENOL ) 325 MG tablet Take 2 tablets (650 mg total) by mouth every 6 (six) hours as needed.   acetaminophen -codeine  (TYLENOL  #3) 300-30 MG tablet Take 1 tablet by mouth every 8 (eight) hours as needed (Cough).   albuterol  (VENTOLIN  HFA) 108 (90 Base) MCG/ACT inhaler Take 1-2 puffs before bed as needed for cough.   amitriptyline  (ELAVIL )  25 MG tablet Take 1 tablet (25 mg total) by mouth at bedtime.   etonogestrel (NEXPLANON) 68 MG IMPL implant 1 each by Subdermal route once.   fluticasone  (FLONASE ) 50 MCG/ACT nasal spray Place 2 sprays into both nostrils daily.   mirtazapine  (REMERON ) 7.5 MG tablet Take 1 tablet (7.5 mg total) by mouth at bedtime.   Multiple Vitamin (MULTIVITAMIN) capsule Take 1 capsule by mouth daily.   OCRELIZUMAB  IV Inject 1 Dose into the vein every 6 (six) months.   ofloxacin  (FLOXIN ) 0.3 % OTIC solution Place 5 drops into the left ear 2 (two) times daily.   ondansetron  (ZOFRAN -ODT) 4 MG disintegrating tablet Take 1 tablet (4 mg total) by mouth every 8 (eight) hours as needed for nausea or vomiting.   oxybutynin  (DITROPAN -XL) 10 MG 24 hr tablet Take 1 tablet (10 mg total) by mouth at bedtime.   pantoprazole  (PROTONIX ) 40 MG tablet Take 1 tablet (40 mg total) by mouth daily.   traZODone (DESYREL) 50 MG tablet Take by mouth at bedtime as needed for sleep. Unsure dose   zonisamide (ZONEGRAN) 100 MG capsule Take 300 mg by mouth daily. (Patient not taking: Reported on 08/25/2024)   No facility-administered encounter medications on file as of 08/25/2024.    History: Past Medical History:  Diagnosis Date   Anemia    Anxiety    Chronic kidney disease (CKD)    follows w nephro   Chronic pain    DDD (degenerative disc disease), cervical    DDD (degenerative disc disease), lumbar    Depression    Menorrhagia with irregular cycle 10/17/2020   Migraine    MS (multiple sclerosis)    Neuromuscular disorder (HCC)    MS dx 2016   Vertigo    Past Surgical History:  Procedure Laterality Date   carpel tunnel     HERNIA REPAIR     Family History  Problem Relation Age of Onset   Diabetes Mother    Cancer Maternal Grandmother        breast cancer   Stroke Maternal Grandfather    Colon cancer Neg Hx    Esophageal cancer Neg Hx    Rectal cancer Neg Hx    Stomach cancer Neg Hx    Social History    Occupational History   Not on file  Tobacco Use   Smoking status: Never   Smokeless tobacco: Never  Vaping Use   Vaping status: Never Used  Substance and Sexual Activity   Alcohol use: Never   Drug use: Never   Sexual activity: Yes    Partners: Male    Birth control/protection: Implant, None   Tobacco Counseling Counseling given: No  SDOH Screenings   Food Insecurity: No Food Insecurity (08/25/2024)  Housing: Unknown (08/25/2024)  Transportation Needs: No Transportation Needs (08/25/2024)  Utilities: Not At Risk (08/25/2024)  Alcohol Screen: Low Risk (08/20/2023)  Depression (PHQ2-9): Low Risk (08/25/2024)  Financial Resource Strain: Low Risk (08/24/2024)  Physical Activity: Insufficiently Active (08/25/2024)  Social Connections: Moderately Isolated (08/25/2024)  Stress: Stress Concern Present (08/24/2024)  Tobacco Use: Low Risk (08/25/2024)  Health Literacy: Adequate Health Literacy (08/25/2024)   See flowsheets for full screening details  Depression Screen PHQ 2 & 9 Depression Scale- Over the past 2 weeks, how often have you been bothered by any of the following problems? Little interest or pleasure in doing things: 0 Feeling down, depressed, or hopeless (PHQ Adolescent also includes...irritable): 0 PHQ-2 Total Score: 0 Trouble falling or staying asleep, or sleeping  too much: 0 Feeling tired or having little energy: 0 Poor appetite or overeating (PHQ Adolescent also includes...weight loss): 0 Feeling bad about yourself - or that you are a failure or have let yourself or your family down: 0 Trouble concentrating on things, such as reading the newspaper or watching television (PHQ Adolescent also includes...like school work): 0 Moving or speaking so slowly that other people could have noticed. Or the opposite - being so fidgety or restless that you have been moving around a lot more than usual: 0 Thoughts that you would be better off dead, or of hurting yourself in some way: 0 PHQ-9 Total  Score: 1 If you checked off any problems, how difficult have these problems made it for you to do your work, take care of things at home, or get along with other people?: Not difficult at all     Goals Addressed               This Visit's Progress     Increase physical activity (pt-stated)        Stay Active an healthy!             Objective:    Today's Vitals   08/25/24 1605  Weight: 185 lb (83.9 kg)  Height: 5' 6 (1.676 m)   Body mass index is 29.86 kg/m.  Hearing/Vision screen Hearing Screening - Comments:: Denies hearing difficulties   Vision Screening - Comments:: Wears rx glasses - up to date with routine eye exams with  [facility] Immunizations and Health Maintenance Health Maintenance  Topic Date Due   Hepatitis C Screening  Never done   Hepatitis B Vaccines 19-59 Average Risk (1 of 3 - 19+ 3-dose series) Never done   Cervical Cancer Screening (HPV/Pap Cotest)  Never done   Mammogram  Never done   Influenza Vaccine  10/18/2024 (Originally 02/19/2024)   Medicare Annual Wellness (AWV)  08/25/2025   DTaP/Tdap/Td (2 - Td or Tdap) 08/11/2029   Colonoscopy  05/07/2030   HPV VACCINES (No Doses Required) Completed   HIV Screening  Completed   Pneumococcal Vaccine  Aged Out   Meningococcal B Vaccine  Aged Out   COVID-19 Vaccine  Discontinued        Assessment/Plan:  This is a routine wellness examination for Andrea Briggs.  Patient Care Team: Frann Mabel Mt, DO as PCP - General (Family Medicine)  I have personally reviewed and noted the following in the patients chart:   Medical and social history Use of alcohol, tobacco or illicit drugs  Current medications and supplements including opioid prescriptions. Functional ability and status Nutritional status Physical activity Advanced directives List of other physicians Hospitalizations, surgeries, and ER visits in previous 12 months Vitals Screenings to include cognitive, depression, and  falls Referrals and appointments  No orders of the defined types were placed in this encounter.  In addition, I have reviewed and discussed with patient certain preventive protocols, quality metrics, and best practice recommendations. A written personalized care plan for preventive services as well as general preventive health recommendations were provided to patient.   Rojelio LELON Blush, LPN   01/22/7972   Return in 53 weeks (on 08/31/2025).  After Visit Summary: (MyChart) Due to this being a telephonic visit, the after visit summary with patients personalized plan was offered to patient via MyChart   Nurse Notes: No voiced or noted concerns at this time "

## 2024-09-08 ENCOUNTER — Ambulatory Visit: Admitting: Pulmonary Disease

## 2024-09-14 ENCOUNTER — Other Ambulatory Visit

## 2025-08-31 ENCOUNTER — Ambulatory Visit
